# Patient Record
Sex: Female | Born: 1940 | Race: White | Hispanic: No | Smoking: Never smoker
Health system: Southern US, Community
[De-identification: ages and names within clinical notes are randomized; demographics above are authoritative.]

## PROBLEM LIST (undated history)

## (undated) DIAGNOSIS — K219 Gastro-esophageal reflux disease without esophagitis: Secondary | ICD-10-CM

## (undated) DIAGNOSIS — M1990 Unspecified osteoarthritis, unspecified site: Secondary | ICD-10-CM

## (undated) DIAGNOSIS — I38 Endocarditis, valve unspecified: Secondary | ICD-10-CM

## (undated) DIAGNOSIS — M419 Scoliosis, unspecified: Secondary | ICD-10-CM

## (undated) DIAGNOSIS — E785 Hyperlipidemia, unspecified: Secondary | ICD-10-CM

## (undated) DIAGNOSIS — R7303 Prediabetes: Secondary | ICD-10-CM

## (undated) DIAGNOSIS — I10 Essential (primary) hypertension: Secondary | ICD-10-CM

## (undated) HISTORY — DX: Unspecified osteoarthritis, unspecified site: M19.90

## (undated) HISTORY — PX: CATARACT EXTRACTION W/ INTRAOCULAR LENS  IMPLANT, BILATERAL: SHX1307

## (undated) HISTORY — PX: HERNIA REPAIR: SHX51

## (undated) HISTORY — PX: OTHER SURGICAL HISTORY: SHX169

## (undated) HISTORY — PX: TOTAL KNEE ARTHROPLASTY: SHX125

## (undated) HISTORY — PX: JOINT REPLACEMENT: SHX530

## (undated) HISTORY — PX: CHOLECYSTECTOMY: SHX55

## (undated) HISTORY — DX: Endocarditis, valve unspecified: I38

---

## 1999-08-05 ENCOUNTER — Encounter: Payer: Self-pay | Admitting: Neurosurgery

## 1999-08-05 ENCOUNTER — Ambulatory Visit (HOSPITAL_COMMUNITY): Admission: RE | Admit: 1999-08-05 | Discharge: 1999-08-05 | Payer: Self-pay | Admitting: Neurosurgery

## 1999-09-08 ENCOUNTER — Encounter: Payer: Self-pay | Admitting: Neurosurgery

## 1999-09-11 ENCOUNTER — Encounter: Payer: Self-pay | Admitting: Neurosurgery

## 1999-09-11 ENCOUNTER — Ambulatory Visit (HOSPITAL_COMMUNITY): Admission: RE | Admit: 1999-09-11 | Discharge: 1999-09-12 | Payer: Self-pay | Admitting: Neurosurgery

## 1999-12-17 ENCOUNTER — Ambulatory Visit (HOSPITAL_COMMUNITY): Admission: RE | Admit: 1999-12-17 | Discharge: 1999-12-17 | Payer: Self-pay | Admitting: Neurosurgery

## 1999-12-17 ENCOUNTER — Encounter: Payer: Self-pay | Admitting: Neurosurgery

## 2000-01-11 ENCOUNTER — Encounter: Payer: Self-pay | Admitting: Neurosurgery

## 2000-01-11 ENCOUNTER — Ambulatory Visit (HOSPITAL_COMMUNITY): Admission: RE | Admit: 2000-01-11 | Discharge: 2000-01-11 | Payer: Self-pay | Admitting: Neurosurgery

## 2000-01-25 ENCOUNTER — Ambulatory Visit (HOSPITAL_COMMUNITY): Admission: RE | Admit: 2000-01-25 | Discharge: 2000-01-25 | Payer: Self-pay | Admitting: Neurosurgery

## 2000-01-25 ENCOUNTER — Encounter: Payer: Self-pay | Admitting: Neurosurgery

## 2000-03-19 ENCOUNTER — Encounter: Payer: Self-pay | Admitting: Neurosurgery

## 2000-03-19 ENCOUNTER — Ambulatory Visit (HOSPITAL_COMMUNITY): Admission: RE | Admit: 2000-03-19 | Discharge: 2000-03-19 | Payer: Self-pay | Admitting: Neurosurgery

## 2005-05-20 ENCOUNTER — Ambulatory Visit: Payer: Self-pay

## 2005-06-22 ENCOUNTER — Ambulatory Visit: Payer: Self-pay

## 2008-08-15 ENCOUNTER — Ambulatory Visit: Payer: Self-pay

## 2009-03-19 ENCOUNTER — Ambulatory Visit: Payer: Self-pay

## 2009-04-03 ENCOUNTER — Ambulatory Visit: Payer: Self-pay | Admitting: General Surgery

## 2009-04-03 ENCOUNTER — Ambulatory Visit: Payer: Self-pay | Admitting: Cardiovascular Disease

## 2009-04-10 ENCOUNTER — Ambulatory Visit: Payer: Self-pay | Admitting: General Surgery

## 2009-08-28 ENCOUNTER — Ambulatory Visit: Payer: Self-pay

## 2010-05-04 ENCOUNTER — Ambulatory Visit: Payer: Self-pay

## 2010-06-24 ENCOUNTER — Ambulatory Visit: Payer: Self-pay | Admitting: Ophthalmology

## 2010-07-07 ENCOUNTER — Ambulatory Visit: Payer: Self-pay | Admitting: Ophthalmology

## 2010-10-20 ENCOUNTER — Ambulatory Visit: Payer: Self-pay | Admitting: Ophthalmology

## 2010-11-03 ENCOUNTER — Ambulatory Visit: Payer: Self-pay | Admitting: Ophthalmology

## 2011-03-03 ENCOUNTER — Ambulatory Visit: Payer: Self-pay

## 2011-03-08 ENCOUNTER — Ambulatory Visit: Payer: Self-pay | Admitting: General Practice

## 2011-03-08 DIAGNOSIS — I10 Essential (primary) hypertension: Secondary | ICD-10-CM

## 2011-03-24 ENCOUNTER — Inpatient Hospital Stay: Payer: Self-pay | Admitting: General Practice

## 2012-02-01 ENCOUNTER — Ambulatory Visit: Payer: Self-pay

## 2012-03-08 ENCOUNTER — Ambulatory Visit: Payer: Self-pay

## 2012-03-21 ENCOUNTER — Ambulatory Visit: Payer: Self-pay | Admitting: General Practice

## 2012-03-21 LAB — URINALYSIS, COMPLETE
Bacteria: NONE SEEN
Bilirubin,UR: NEGATIVE
Blood: NEGATIVE
Glucose,UR: NEGATIVE mg/dL (ref 0–75)
Ketone: NEGATIVE
Nitrite: NEGATIVE
Ph: 6 (ref 4.5–8.0)
Protein: NEGATIVE
RBC,UR: 1 /HPF (ref 0–5)
Specific Gravity: 1.011 (ref 1.003–1.030)
WBC UR: 49 /HPF (ref 0–5)

## 2012-03-21 LAB — CBC
HGB: 12.5 g/dL (ref 12.0–16.0)
MCH: 30.8 pg (ref 26.0–34.0)
MCHC: 33.4 g/dL (ref 32.0–36.0)
RBC: 4.05 10*6/uL (ref 3.80–5.20)
RDW: 14.2 % (ref 11.5–14.5)
WBC: 6 10*3/uL (ref 3.6–11.0)

## 2012-03-21 LAB — BASIC METABOLIC PANEL
Anion Gap: 6 — ABNORMAL LOW (ref 7–16)
BUN: 20 mg/dL — ABNORMAL HIGH (ref 7–18)
Calcium, Total: 8.6 mg/dL (ref 8.5–10.1)
Creatinine: 0.72 mg/dL (ref 0.60–1.30)
EGFR (African American): >60
Glucose: 86 mg/dL (ref 65–99)
Osmolality: 281 (ref 275–301)
Potassium: 4 mmol/L (ref 3.5–5.1)
Sodium: 140 mmol/L (ref 136–145)

## 2012-03-21 LAB — PROTIME-INR
INR: 0.9
Prothrombin Time: 12.7 secs (ref 11.5–14.7)

## 2012-03-21 LAB — MRSA PCR SCREENING

## 2012-03-21 LAB — APTT: Activated PTT: 28.9 secs (ref 23.6–35.9)

## 2012-03-23 LAB — URINE CULTURE

## 2012-04-03 ENCOUNTER — Inpatient Hospital Stay: Payer: Self-pay | Admitting: General Practice

## 2012-04-03 LAB — PROTIME-INR: Prothrombin Time: 12.8 secs (ref 11.5–14.7)

## 2012-04-04 LAB — BASIC METABOLIC PANEL
Anion Gap: 10 (ref 7–16)
BUN: 15 mg/dL (ref 7–18)
Calcium, Total: 7.9 mg/dL — ABNORMAL LOW (ref 8.5–10.1)
Creatinine: 0.72 mg/dL (ref 0.60–1.30)
EGFR (African American): >60
EGFR (Non-African Amer.): >60
Glucose: 119 mg/dL — ABNORMAL HIGH (ref 65–99)
Osmolality: 287 (ref 275–301)
Potassium: 3.9 mmol/L (ref 3.5–5.1)
Sodium: 143 mmol/L (ref 136–145)

## 2012-04-04 LAB — PLATELET COUNT: Platelet: 182 10*3/uL (ref 150–440)

## 2012-04-05 LAB — BASIC METABOLIC PANEL
BUN: 15 mg/dL (ref 7–18)
Calcium, Total: 7.6 mg/dL — ABNORMAL LOW (ref 8.5–10.1)
Chloride: 108 mmol/L — ABNORMAL HIGH (ref 98–107)
Creatinine: 0.82 mg/dL (ref 0.60–1.30)
Glucose: 109 mg/dL — ABNORMAL HIGH (ref 65–99)
Osmolality: 290 (ref 275–301)
Potassium: 3.5 mmol/L (ref 3.5–5.1)
Sodium: 145 mmol/L (ref 136–145)

## 2012-04-05 LAB — PLATELET COUNT: Platelet: 163 10*3/uL (ref 150–440)

## 2012-04-05 LAB — HEMOGLOBIN: HGB: 10.6 g/dL — ABNORMAL LOW (ref 12.0–16.0)

## 2012-06-29 ENCOUNTER — Ambulatory Visit: Payer: Self-pay | Admitting: General Surgery

## 2012-06-29 LAB — CBC WITH DIFFERENTIAL/PLATELET
Basophil #: 0 10*3/uL (ref 0.0–0.1)
Eosinophil #: 0.1 10*3/uL (ref 0.0–0.7)
Eosinophil %: 1.2 %
Lymphocyte #: 0.7 10*3/uL — ABNORMAL LOW (ref 1.0–3.6)
Lymphocyte %: 12.7 %
MCH: 29.5 pg (ref 26.0–34.0)
MCV: 87 fL (ref 80–100)
Monocyte #: 0.3 x10 3/mm (ref 0.2–0.9)
Neutrophil %: 80.8 %
Platelet: 223 10*3/uL (ref 150–440)
RBC: 4.18 10*6/uL (ref 3.80–5.20)
RDW: 14.4 % (ref 11.5–14.5)

## 2012-06-29 LAB — BASIC METABOLIC PANEL
Anion Gap: 6 — ABNORMAL LOW (ref 7–16)
Calcium, Total: 8.9 mg/dL (ref 8.5–10.1)
Co2: 33 mmol/L — ABNORMAL HIGH (ref 21–32)
Creatinine: 1 mg/dL (ref 0.60–1.30)
EGFR (Non-African Amer.): 57 — ABNORMAL LOW
Potassium: 3.3 mmol/L — ABNORMAL LOW (ref 3.5–5.1)
Sodium: 141 mmol/L (ref 136–145)

## 2012-07-03 ENCOUNTER — Ambulatory Visit: Payer: Self-pay | Admitting: General Surgery

## 2012-07-03 LAB — POTASSIUM: Potassium: 3.9 mmol/L (ref 3.5–5.1)

## 2012-09-12 ENCOUNTER — Ambulatory Visit: Payer: Self-pay

## 2013-02-14 ENCOUNTER — Ambulatory Visit: Payer: Self-pay

## 2013-05-09 ENCOUNTER — Ambulatory Visit: Payer: Self-pay

## 2014-03-15 ENCOUNTER — Ambulatory Visit: Payer: Self-pay

## 2014-09-24 ENCOUNTER — Ambulatory Visit: Payer: Self-pay | Admitting: Orthopedic Surgery

## 2014-10-28 ENCOUNTER — Ambulatory Visit: Payer: Self-pay | Admitting: Orthopedic Surgery

## 2014-11-22 ENCOUNTER — Ambulatory Visit (INDEPENDENT_AMBULATORY_CARE_PROVIDER_SITE_OTHER): Payer: Medicare HMO

## 2014-11-22 ENCOUNTER — Ambulatory Visit (INDEPENDENT_AMBULATORY_CARE_PROVIDER_SITE_OTHER): Payer: Medicare HMO | Admitting: Podiatry

## 2014-11-22 ENCOUNTER — Encounter: Payer: Self-pay | Admitting: Podiatry

## 2014-11-22 VITALS — BP 112/61 | HR 65 | Resp 16

## 2014-11-22 DIAGNOSIS — M79673 Pain in unspecified foot: Secondary | ICD-10-CM

## 2014-11-22 DIAGNOSIS — M779 Enthesopathy, unspecified: Secondary | ICD-10-CM

## 2014-11-22 NOTE — Progress Notes (Signed)
Subjective:     Patient ID: Regina Mathis, female   DOB: [birth year], 74 y.o.   MRN: [id number]  HPI patient presents after not being seen for a while with pain under her fifth metatarsals of both feet with inability to walk comfortably on her feet. States orthotics have been beneficial but they have started to wear out   Review of Systems  All other systems reviewed and are negative.      Objective:   Physical Exam  Neurovascular status intact with muscle strength adequate range of motion within normal limits. Patient's noted to have plantar flexed fifth metatarsals with keratotic lesions plantar that are very painful when pressed with obvious increased stress occurring against the bones themselves    Assessment:     Plantar flexed fifth metatarsals of both feet with lesion formation and pain    Plan:     H&P and x-rays reviewed. At this point I recommended long-term orthotics and discussed customized orthotics to reduce the stress against the fifth metatarsal and patient is scanned currently for customized orthotic devices

## 2014-11-22 NOTE — Progress Notes (Signed)
   Subjective:    Patient ID: Regina Mathis, female    DOB: 1941-08-19, 74 y.o.   MRN: [id number]  HPI Comments: Requesting orthotics     Review of Systems  Musculoskeletal: Positive for gait problem.  All other systems reviewed and are negative.      Objective:   Physical Exam        Assessment & Plan:

## 2015-01-14 NOTE — Op Note (Signed)
PATIENT NAME:  Regina Mathis, Regina Mathis MR#:  [id number] DATE OF BIRTH:  June 14, 1941  DATE OF PROCEDURE:  07/03/2012  PREOPERATIVE DIAGNOSIS: Port site incision hernia near the umbilicus.   POSTOPERATIVE DIAGNOSIS: Port site incision hernia near the umbilicus.   PROCEDURE PERFORMED: Laparoscopy and repair of incisional hernia with mesh.   SURGEON: Mckinley Jewel, M.D.   ANESTHESIA: General.   COMPLICATIONS: None.   ESTIMATED BLOOD LOSS: Minimal.   DRAINS: None.   DESCRIPTION OF PROCEDURE: This patient was put to sleep in the supine position on the operating table. The abdomen was prepped and draped out as a sterile field. A small port site incision was made in the left upper quadrant beneath the costal margin and the incision deepened through to expose the fascia. A Veress needle with the InnerDyne sleeve was then positioned in the peritoneal cavity, verified by hanging drop method. Pneumoperitoneum was obtained. At this point, the patient's previously noted hernia had reduced itself, but on inflation of the abdominal cavity a moderate swelling was encountered just to the right of the umbilicus. After pneumoperitoneum was completed, the camera was introduced with a 10 mm port in place and showed a fascial defect that was about a little larger than a fingerbreadth in size. No contents going into this hernia was noted. It was therefore decided to make a small incision overlying the hernial opening and place a circular Proceed ventral patch. A 6.7 cm diameter patch was selected. A small incision was made vertically around the right side of the umbilicus and carried down to expose the hernial sac which was then freed down to the fascia. The hernia was then opened. The sac was excised out. The fascial edges were pulled up. The mesh was then inserted and pulled up with the leaves in the middle and anchored to the fascia with three figure-of-eight stitches of 3-0 Prolene. Excess of the anterior leaves was excised  out. Pneumoperitoneum was reestablished and the camera was placed. The patch appeared to be in good position. There was some free movement of the edges noted. Accordingly, a 5 mm port was placed in the left mid abdominal area and the secure strap tacker was used to tack down the edges. One small bleeder was encountered at one of the tack sites, which was repaired with placement of a 0 Prolene stitch, with a suture passer. This was adequately controlled. Mesh was noted to be lying in good position and without any lift on the sides. The pneumoperitoneum was released. The ports were removed. The subcutaneous tissue was closed with 3-0 Vicryl and the skin with subcuticular 4-0 Vicryl covered with Dermabond. The procedure was well tolerated. She was subsequently extubated and returned to the recovery room in stable condition.  ____________________________ S.Robinette Haines, MD sgs:slb D: 07/03/2012 11:41:43 ET T: 07/03/2012 12:07:49 ET JOB#: [id number]  cc: S.G. Jamal Collin, MD, <Dictator> [facility] Robinette Haines MD ELECTRONICALLY SIGNED 07/05/2012 9:28

## 2015-01-19 NOTE — Op Note (Signed)
PATIENT NAME:  Regina Mathis, Regina Mathis MR#:  [id number] DATE OF BIRTH:  28-Dec-1940  DATE OF PROCEDURE:  04/03/2012  PREOPERATIVE DIAGNOSIS: Degenerative arthrosis of the right knee.   POSTOPERATIVE DIAGNOSIS: Degenerative arthrosis of the right knee.   PROCEDURE PERFORMED: Right total knee arthroplasty using computer-assisted navigation.   SURGEON: Laurice Record. Holley Bouche., MD   ASSISTANT: Vance Peper, PA-C (required to maintain retraction throughout the procedure)   ANESTHESIA: General.   ESTIMATED BLOOD LOSS: 50 mL.   FLUIDS REPLACED: 1200 mL of crystalloid.   TOURNIQUET TIME: 101 minutes.   DRAINS: Two medium drains to a reinfusion system.   SOFT TISSUE RELEASES: Anterior cruciate ligament, posterior cruciate ligament, deep medial collateral ligament, patellofemoral ligament, posterior lateral corner, and pie crusting of the iliotibial band.   IMPLANTS UTILIZED: DePuy PFC Sigma size 2 posterior stabilized femoral component (cemented), size 2.5 MBT tibial component (cemented), 32 mm three-peg oval dome patella (cemented), and a 12.5 mm stabilized rotating platform polyethylene insert.   INDICATIONS FOR SURGERY: The patient is a 74 year old female who has been seen for complaints of progressive right knee pain and valgus deformity. X-rays demonstrated severe degenerative changes in tricompartmental fashion with gross valgus deformity. The patient previously underwent left total knee arthroplasty with excellent results. The risks and benefits of surgical intervention were discussed with the patient. She expressed her understanding of the risks and benefits and agreed with plans for surgical intervention.  PROCEDURE IN DETAIL: The patient was brought into the Operating Room, and after adequate general anesthesia was achieved a tourniquet was placed on the patient's upper right thigh. The patient's right knee and leg were cleaned and prepped with alcohol and DuraPrep and draped in the usual sterile  fashion. A "timeout" was performed as per usual protocol. The right lower extremity was exsanguinated using an Esmarch, and the tourniquet was inflated to 300 mmHg. An anterior longitudinal incision was made, followed by a standard mid vastus approach. A large effusion was evacuated. The deep fibers of the medial collateral ligament were elevated in a subperiosteal fashion off the medial flare of the tibia so as to maintain a continuous soft tissue sleeve. The patella was subluxed laterally and the patellofemoral ligament was incised. Inspection of the knee demonstrated severe degenerative changes in tricompartmental fashion with eburnated bone noted to the lateral compartment. Prominent osteophytes were debrided using a rongeur. Anterior and posterior cruciate ligaments were excised. Two 4.0 mm Schanz pins were inserted into the femur and into the tibia for attachment of the ray of trackers used for computer-assisted navigation. Hip center was identified using circumduction technique. Distal landmarks were mapped using the computer. The distal femur and proximal tibia were mapped using the computer. The distal femoral cutting guide was positioned using computer-assisted navigation so as to achieve a 5-degree distal valgus cut. Cut was performed and verified using the computer. The distal femur was sized. and it was felt that a size 2 femoral component was appropriate. A size two cutting guide was positioned and the anterior cut was performed and verified using the computer. This was followed by completion of the posterior and chamfer cuts. A femoral cutting guide for a central box was then positioned, and the central box cut was performed.   Attention was then directed to the proximal tibia. Medial and lateral menisci were excised. The extramedullary tibial cutting guide was positioned using computer-assisted navigation so as to achieve 0-degrees varus-valgus alignment and 0-degree posterior slope. Cut was  performed and verified using the computer.  The proximal tibia was sized, and it was felt that a size 2.5 tibial tray was appropriate. Tibial and femoral trials were inserted, followed by insertion of a 10 mm polyethylene trial. The knee was felt to be tight laterally. Trial components were removed and the knee was placed in extension and distraction applied using Moreland retractors. The posterolateral corner was carefully released using a combination of electrocautery and Metzenbaum scissors. Trial components were reinserted with first a 10 and subsequently a 12.5 mm polyethylene trial. The knee was still felt to be somewhat tight laterally. Pie crusting of the iliotibial band was performed with the implants and placing the knee in full extension. This allowed for excellent mediolateral soft tissue balancing. Finally, the patella was cut and prepared so as to accommodate a 32 mm three-peg oval dome patella. A patellar trial was placed, and the knee was placed through a range of motion with excellent patellar tracking appreciated.   A femoral component was removed. The central post hole for the tibial component was reamed, followed by insertion of a keel punch. Tibial trials were then removed. The cut surfaces of bone were irrigated with copious amounts of normal saline with antibiotic solution using pulsatile lavage and then suctioned dry. Polymethylmethacrylate cement was prepared in the usual fashion using a vacuum mixer. Cement was applied to the cut surface of the proximal tibia as well as along the undersurface of a size 2.5 MBT tibial component. The tibial component was positioned and impacted into place. Excess cement was removed using freer elevators. Cement was then applied to the cut surface of the femur as well as along the posterior flanges of a size 2 posterior stabilized femoral component. The femoral component was positioned and impacted into place. Excess cement was removed using freer elevators.  A 12.5 mm polyethylene trial was inserted and the knee was brought in full extension with steady axial compression applied. Finally, cement was applied to the backside of a 32 mm three-peg oval dome patella, and the patellar component was positioned and patellar clamp applied. Excess cement was removed using freer elevators.   After adequate curing of cement, the tourniquet was deflated after a total tourniquet time of 101 minutes. Hemostasis was achieved using electrocautery. The knee was irrigated with copious amounts of normal saline with antibiotic solution using pulsatile lavage and then suctioned dry. The knee was inspected for any residual cement debris, and 30 mL of 0.25% Marcaine with epinephrine was injected along the posterior capsule. A 12.5 mm stabilized rotating platform polyethylene insert was inserted, and the knee was placed through a range of motion. Again, excellent mediolateral soft tissue balancing was appreciated both in full extension and in flexion. Excellent patellar tracking was appreciated. Two medium drains were placed in the wound bed and brought out through a separate stab incision to be attached to a reinfusion system. The medial parapatellar portion of the incision was reapproximated using interrupted sutures of #1 Vicryl. The subcutaneous tissue was approximated in layers using first #0 Vicryl, followed by 2-0 Vicryl. The skin was closed with skin staples. A sterile dressing was applied.   The patient tolerated the procedure well. She was transported to the recovery room in stable condition.  ____________________________ Laurice Record. Holley Bouche., MD jph:cbb D: 04/03/2012 19:59:04 ET T: 04/04/2012 09:59:06 ET JOB#: [id number]  cc: Jeneen Rinks P. Holley Bouche., MD, <Dictator> JAMES P Holley Bouche MD ELECTRONICALLY SIGNED 04/04/2012 23:20

## 2015-01-19 NOTE — Discharge Summary (Signed)
PATIENT NAME:  Regina Mathis, Regina Mathis MR#:  [id number] DATE OF BIRTH:  02/23/1941  DATE OF ADMISSION:  04/03/2012 DATE OF DISCHARGE:  04/06/2012   ADMITTING DIAGNOSIS: Degenerative arthrosis of the right knee.   DISCHARGE DIAGNOSIS: Degenerative arthrosis of the right knee.   HISTORY: The patient is a 74 year old female who has been followed at [facility] for progression of right knee pain. The pain was noted to be aggravated with weight-bearing activities. She was experiencing some night discomfort. She had localized most of the severe pain along the lateral aspect of the knee and did report some progressive "locked knee" deformity. At the time of surgery, she was using a walker for ambulation of a long distance. She had reported some near giving away of the knee as well. The right knee pain had progressed to the point that it was significantly interfering with her activities of daily living. X-rays taken in the [facility] showed narrowing of the lateral cartilage space with associated valgus deformity associated with valgus alignment. She was noted to have osteophyte as well as subchondral sclerosis. After a discussion of the risks and benefits of surgical intervention, the patient expressed her understanding of the risks and benefits and agreed for plans for surgical intervention.   PROCEDURE: Right total knee arthroplasty using computer-assisted navigation.   ANESTHESIA: General.   SOFT TISSUE RELEASE: Anterior cruciate ligament, posterior cruciate ligament, deep medial collateral ligament, patellofemoral ligament, posterior lateral corner, and pie crusting of the iliotibial band.   IMPLANTS UTILIZED: DePuy PFC Sigma size two posterior stabilized femoral component (cemented), size 2.5 MBT tibial component (cemented), 32 mm three pegged oval dome patella (cemented), and a 12.5 mm stabilized rotating platform polyethylene insert.   HOSPITAL COURSE: The patient tolerated the  procedure very well. She had no complications. She was then taken to PAC-U where she was stabilized and then transferred to the orthopedic floor. She began receiving anticoagulation therapy of Lovenox 30 mg subcutaneous every 12 hours per anesthesia protocol. She was fitted with TED stockings bilaterally. These were allowed to be removed one hour per eight hour shift. The right one was applied on day two following removal of the Hemovac and dressing change. She was also fitted with the AV-I compression foot pumps bilaterally set at 80 mmHg. Her calves have been nontender. There has been no evidence of any deep venous thromboses of the lower extremity. Heels were elevated off the bed using rolled towels.   The patient's vital signs have been stable. She has been afebrile. She is denying chest pain or shortness of breath. Hemodynamically, she was stable. No transfusions were given other than the Autovac transfusion given the first six hours postoperatively.   Physical therapy was initiated on day one for gait training and transfers. Upon being discharged, she was ambulating greater than 50 feet. Occupational therapy was also initiated on day one for activities of daily living and assistive devices.   The patient's IV, Foley and Hemovac were discontinued on day two along with dressing change. The Polar Care was reapplied to the surgical leg maintaining a temperature of 40 to 50 degrees Fahrenheit. The wound has been free of any drainage or any signs of infection.   DISPOSITION: The patient is being discharged to skilled nursing facility in improved stable condition.   DISCHARGE INSTRUCTIONS:  1. She may weight bear as tolerated.  2. Continue using the immobilizer until she is able to do 10 straight leg raises on her own.  3. Continue TED  stockings bilaterally. These are allowed to be removed one hour per eight hour shift.  4. Incentive spirometer q.1 hour while awake.  5. Encourage cough, deep breathing  every two hours while awake.  6. Polar Care to the surgical leg maintaining a temperature of 40 to 50 degrees Fahrenheit.  7. Occupational therapy for activities of daily living and assistive devices.  8. Physical therapy for gait training and transfers.  9. She is placed on a regular diet.  10. She has a follow-up in the clinic on 07/23 at 9:45 with Vance Peper, PA.  11. Follow-up appointment with Dr. Marry Guan on 08/20 at 10:45.   DRUG ALLERGIES: No known drug allergies.   MEDICATIONS:  1. Dulcolax suppository 10 mg rectally p.r.n. for constipation. 2. Milk of magnesia 30 mL b.i.d. p.r.n.  3. Mylanta DS 30 mL every six hours p.r.n.  4. Senokot-S 1 tablet b.i.d.  5. Fish oil 1 gram capsule daily. 6. Lasix 20 mg daily. 7. Lisinopril 20/HCTZ 25 mg 1 capsule q.a.m.  8. Zantac 150 mg daily. 9. Zocor 40 mg at bedtime.  10. Ascorbic acid 1000 milligrams daily.  11. Vitamin D3 1000 units daily. 12. Tylenol ES 500 to 1000 mg milligrams every four hours p.r.n. for temperatures of 100.4. 13. Roxicodone 5 to 10 mg every 4 to 6 hours p.r.n. for pain. 14. Tramadol 50 to 100 mg every four to six hours p.r.n. for pain.  15. Lovenox 30 mg subcutaneous every 12 hours for 14 days, then discontinue and begin taking one 81 mg enteric-coated aspirin.   PAST MEDICAL HISTORY:  1. Arthritis.  2. Hypertension. 3. Cardiomegaly. 4. Cataracts.   ____________________________ Vance Peper, PA jrw:ap D: 04/06/2012 06:58:01 ET T: 04/06/2012 07:45:24 ET JOB#: [id number]  cc: Vance Peper, PA, <Dictator> JON WOLFE PA ELECTRONICALLY SIGNED 04/06/2012 21:49

## 2015-11-20 DIAGNOSIS — M159 Polyosteoarthritis, unspecified: Secondary | ICD-10-CM | POA: Insufficient documentation

## 2015-11-20 DIAGNOSIS — M150 Primary generalized (osteo)arthritis: Secondary | ICD-10-CM

## 2016-01-15 ENCOUNTER — Encounter: Payer: Self-pay | Admitting: *Deleted

## 2016-01-16 ENCOUNTER — Ambulatory Visit
Admission: RE | Admit: 2016-01-16 | Discharge: 2016-01-16 | Disposition: A | Discharge status: Home / Self Care | Payer: Commercial Managed Care - HMO | Source: Ambulatory Visit | Attending: Gastroenterology | Admitting: Gastroenterology

## 2016-01-16 ENCOUNTER — Ambulatory Visit: Payer: Commercial Managed Care - HMO | Admitting: Anesthesiology

## 2016-01-16 ENCOUNTER — Encounter: Payer: Self-pay | Admitting: *Deleted

## 2016-01-16 ENCOUNTER — Encounter
Admission: RE | Disposition: A | Discharge status: Home / Self Care | Payer: Self-pay | Source: Ambulatory Visit | Attending: Gastroenterology

## 2016-01-16 DIAGNOSIS — R195 Other fecal abnormalities: Secondary | ICD-10-CM | POA: Insufficient documentation

## 2016-01-16 DIAGNOSIS — Z833 Family history of diabetes mellitus: Secondary | ICD-10-CM | POA: Insufficient documentation

## 2016-01-16 DIAGNOSIS — Z9049 Acquired absence of other specified parts of digestive tract: Secondary | ICD-10-CM | POA: Insufficient documentation

## 2016-01-16 DIAGNOSIS — Z79899 Other long term (current) drug therapy: Secondary | ICD-10-CM | POA: Insufficient documentation

## 2016-01-16 DIAGNOSIS — Z801 Family history of malignant neoplasm of trachea, bronchus and lung: Secondary | ICD-10-CM | POA: Insufficient documentation

## 2016-01-16 DIAGNOSIS — D122 Benign neoplasm of ascending colon: Secondary | ICD-10-CM | POA: Insufficient documentation

## 2016-01-16 DIAGNOSIS — E785 Hyperlipidemia, unspecified: Secondary | ICD-10-CM | POA: Insufficient documentation

## 2016-01-16 DIAGNOSIS — D123 Benign neoplasm of transverse colon: Secondary | ICD-10-CM | POA: Insufficient documentation

## 2016-01-16 DIAGNOSIS — D175 Benign lipomatous neoplasm of intra-abdominal organs: Secondary | ICD-10-CM | POA: Insufficient documentation

## 2016-01-16 DIAGNOSIS — M1990 Unspecified osteoarthritis, unspecified site: Secondary | ICD-10-CM | POA: Insufficient documentation

## 2016-01-16 DIAGNOSIS — I10 Essential (primary) hypertension: Secondary | ICD-10-CM | POA: Insufficient documentation

## 2016-01-16 DIAGNOSIS — Z96653 Presence of artificial knee joint, bilateral: Secondary | ICD-10-CM | POA: Insufficient documentation

## 2016-01-16 DIAGNOSIS — K5730 Diverticulosis of large intestine without perforation or abscess without bleeding: Secondary | ICD-10-CM | POA: Insufficient documentation

## 2016-01-16 DIAGNOSIS — K219 Gastro-esophageal reflux disease without esophagitis: Secondary | ICD-10-CM | POA: Insufficient documentation

## 2016-01-16 DIAGNOSIS — Z9842 Cataract extraction status, left eye: Secondary | ICD-10-CM | POA: Insufficient documentation

## 2016-01-16 DIAGNOSIS — K635 Polyp of colon: Secondary | ICD-10-CM | POA: Insufficient documentation

## 2016-01-16 DIAGNOSIS — K640 First degree hemorrhoids: Secondary | ICD-10-CM | POA: Insufficient documentation

## 2016-01-16 DIAGNOSIS — Z7982 Long term (current) use of aspirin: Secondary | ICD-10-CM | POA: Insufficient documentation

## 2016-01-16 DIAGNOSIS — I517 Cardiomegaly: Secondary | ICD-10-CM | POA: Insufficient documentation

## 2016-01-16 DIAGNOSIS — Z9841 Cataract extraction status, right eye: Secondary | ICD-10-CM | POA: Insufficient documentation

## 2016-01-16 DIAGNOSIS — Z823 Family history of stroke: Secondary | ICD-10-CM | POA: Insufficient documentation

## 2016-01-16 DIAGNOSIS — Z8249 Family history of ischemic heart disease and other diseases of the circulatory system: Secondary | ICD-10-CM | POA: Insufficient documentation

## 2016-01-16 HISTORY — DX: Essential (primary) hypertension: I10

## 2016-01-16 HISTORY — DX: Hyperlipidemia, unspecified: E78.5

## 2016-01-16 HISTORY — DX: Gastro-esophageal reflux disease without esophagitis: K21.9

## 2016-01-16 HISTORY — PX: COLONOSCOPY WITH PROPOFOL: SHX5780

## 2016-01-16 SURGERY — COLONOSCOPY WITH PROPOFOL
Anesthesia: General

## 2016-01-16 MED ORDER — FENTANYL CITRATE (PF) 100 MCG/2ML IJ SOLN
25.0000 ug | INTRAMUSCULAR | Status: DC | PRN
  Administered 2016-01-16: 50 ug via INTRAVENOUS

## 2016-01-16 MED ORDER — SODIUM CHLORIDE 0.9 % IV SOLN
INTRAVENOUS | Status: DC
  Administered 2016-01-16: 11:00:00 via INTRAVENOUS
  Administered 2016-01-16: 1000 mL via INTRAVENOUS

## 2016-01-16 MED ORDER — PROPOFOL 500 MG/50ML IV EMUL
INTRAVENOUS | Status: DC | PRN
  Administered 2016-01-16: 75 ug/kg/min via INTRAVENOUS

## 2016-01-16 MED ORDER — MIDAZOLAM HCL 2 MG/2ML IJ SOLN
INTRAMUSCULAR | Status: DC | PRN
  Administered 2016-01-16: 1 mg via INTRAVENOUS

## 2016-01-16 MED ORDER — ONDANSETRON HCL 4 MG/2ML IJ SOLN: 4.0000 mg | Freq: Once | INTRAMUSCULAR | Status: DC | PRN

## 2016-01-16 MED ORDER — PROPOFOL 10 MG/ML IV BOLUS
INTRAVENOUS | Status: DC | PRN
  Administered 2016-01-16: 30 mg via INTRAVENOUS

## 2016-01-16 NOTE — Anesthesia Postprocedure Evaluation (Signed)
Anesthesia Post Note  Patient: Regina Mathis  Procedure(s) Performed: Procedure(s) (LRB): COLONOSCOPY WITH PROPOFOL (N/A)  Patient location during evaluation: PACU Anesthesia Type: General Level of consciousness: awake and alert and oriented Pain management: pain level controlled Vital Signs Assessment: post-procedure vital signs reviewed and stable Respiratory status: spontaneous breathing Cardiovascular status: blood pressure returned to baseline Anesthetic complications: no    Last Vitals:  Filed Vitals:   01/16/16 1130 01/16/16 1140  BP: 119/66 119/66  Pulse: 83 45  Temp:    Resp: 23 21    Last Pain: There were no vitals filed for this visit.               Ahmere Hemenway

## 2016-01-16 NOTE — Transfer of Care (Signed)
Immediate Anesthesia Transfer of Care Note  Patient: Regina Mathis  Procedure(s) Performed: Procedure(s): COLONOSCOPY WITH PROPOFOL (N/A)  Patient Location: PACU  Anesthesia Type:General  Level of Consciousness: awake and sedated  Airway & Oxygen Therapy: Patient Spontanous Breathing and Patient connected to nasal cannula oxygen  Post-op Assessment: Report given to RN and Post -op Vital signs reviewed and stable  Post vital signs: Reviewed and stable  Last Vitals:  Filed Vitals:   01/16/16 1013  BP: 112/78  Pulse: 79  Temp: 36.7 C  Resp: 18    Complications: No apparent anesthesia complications

## 2016-01-16 NOTE — Anesthesia Preprocedure Evaluation (Signed)
Anesthesia Evaluation  Patient identified by MRN, date of birth, ID band Patient awake    Reviewed: Allergy & Precautions, NPO status , Patient's Chart, lab work & pertinent test results  Airway Mallampati: II       Dental  (+) Chipped   Pulmonary neg pulmonary ROS,    Pulmonary exam normal        Cardiovascular hypertension, Normal cardiovascular exam  Enlarged heart   Neuro/Psych negative neurological ROS  negative psych ROS   GI/Hepatic Neg liver ROS, GERD  Medicated and Controlled,  Endo/Other  negative endocrine ROS  Renal/GU negative Renal ROS  negative genitourinary   Musculoskeletal  (+) Arthritis , Osteoarthritis,    Abdominal Normal abdominal exam  (+)   Peds negative pediatric ROS (+)  Hematology negative hematology ROS (+)   Anesthesia Other Findings   Reproductive/Obstetrics                             Anesthesia Physical Anesthesia Plan  ASA: III  Anesthesia Plan: General   Post-op Pain Management:    Induction: Intravenous  Airway Management Planned: Nasal Cannula  Additional Equipment:   Intra-op Plan:   Post-operative Plan:   Informed Consent: I have reviewed the patients History and Physical, chart, labs and discussed the procedure including the risks, benefits and alternatives for the proposed anesthesia with the patient or authorized representative who has indicated his/her understanding and acceptance.   Dental advisory given  Plan Discussed with: CRNA and Surgeon  Anesthesia Plan Comments:         Anesthesia Quick Evaluation

## 2016-01-16 NOTE — H&P (Signed)
Primary Care Physician:  Glendon Axe, MD  Pre-Procedure History & Physical: HPI:  Regina Mathis is a 75 y.o. female is here for an colonoscopy.   Past Medical History  Diagnosis Date  . Arthritis   . Cataracts, bilateral   . GERD (gastroesophageal reflux disease)   . Enlarged heart   . Hernia of abdominal cavity   . Hyperlipidemia   . Hypertension     Past Surgical History  Procedure Laterality Date  . Cholecystectomy    . Lumpectomy chest area    . Cataract extraction w/ intraocular lens  implant, bilateral    . Total knee arthroplasty Bilateral   . Joint replacement Bilateral     Total Knee Arthroplasty (LT 2012) (RT 2013)  . Hernia repair      with Bowel Involvement (04/2012)    Prior to Admission medications   Medication Sig Start Date End Date Taking? Authorizing Provider  ascorbic acid (VITAMIN C) 1000 MG tablet Take 1,000 mg by mouth daily.   Yes Historical Provider, MD  aspirin EC 81 MG tablet Take 81 mg by mouth daily.   Yes Historical Provider, MD  Biotin 1 MG CAPS Take 1 mg by mouth daily.   Yes Historical Provider, MD  CHOLECALCIFEROL PO Take 1,000 Units by mouth daily.   Yes Historical Provider, MD  lisinopril-hydrochlorothiazide (PRINZIDE,ZESTORETIC) 20-25 MG per tablet Take 1 tablet by mouth daily.   Yes Historical Provider, MD  Omega 3-6-9 Fatty Acids (OMEGA 3-6-9 COMPLEX PO) Take 1,200 mg by mouth daily.   Yes Historical Provider, MD  furosemide (LASIX) 20 MG tablet Take 20 mg by mouth.    Historical Provider, MD  ranitidine (ZANTAC) 150 MG tablet Take 150 mg by mouth 2 (two) times daily.    Historical Provider, MD  simvastatin (ZOCOR) 40 MG tablet Take 40 mg by mouth daily.    Historical Provider, MD  traMADol (ULTRAM) 50 MG tablet Take by mouth every 6 (six) hours as needed.    Historical Provider, MD    Allergies as of 01/09/2016  . (No Known Allergies)    Family History  Problem Relation Age of Onset  . CAD Mother   . Diabetes Mellitus II  Mother   . Stroke Mother   . Lung cancer Father   . Diabetes Mellitus II Sister   . Diabetes Mellitus II Brother     Social History   Social History  . Marital Status: Widowed    Spouse Name: N/A  . Number of Children: N/A  . Years of Education: N/A   Occupational History  . Not on file.   Social History Main Topics  . Smoking status: Never Smoker   . Smokeless tobacco: Never Used  . Alcohol Use: No  . Drug Use: No  . Sexual Activity: Not on file   Other Topics Concern  . Not on file   Social History Narrative     Physical Exam: BP 112/78 mmHg  Pulse 79  Temp(Src) 98 F (36.7 C) (Oral)  Resp 18  Ht 5\' 2"  (1.575 m)  Wt 81.647 kg (180 lb)  BMI 32.91 kg/m2  SpO2 100% General:   Alert,  pleasant and cooperative in NAD Head:  Normocephalic and atraumatic. Neck:  Supple; no masses or thyromegaly. Lungs:  Clear throughout to auscultation.    Heart:  Regular rate and rhythm. Abdomen:  Soft, nontender and nondistended. Normal bowel sounds, without guarding, and without rebound.   Neurologic:  Alert and  oriented x4;  grossly normal neurologically.  Impression/Plan: Regina Mathis is here for an colonoscopy to be performed for + FOBT  Risks, benefits, limitations, and alternatives regarding  colonoscopy have been reviewed with the patient.  Questions have been answered.  All parties agreeable.   Regina Class, MD  01/16/2016, 10:43 AM

## 2016-01-16 NOTE — Discharge Instructions (Signed)

## 2016-01-16 NOTE — Op Note (Signed)
[facility] Gastroenterology Patient Name: Regina Mathis Procedure Date: 01/16/2016 10:24 AM MRN: [id number] Account #: [id number] Date of Birth: 1941-02-28 Admit Type: Outpatient Age: 75 Room: [facility] ENDO ROOM 1 Gender: Female Note Status: Finalized Procedure:            Colonoscopy Indications:          This is the patient's first colonoscopy, Heme positive                        stool Patient Profile:      This is a 75 year old female. Providers:            Gerrit Heck. Rayann Heman, MD Referring MD:         Glendon Axe (Referring MD) Medicines:            Propofol per Anesthesia Complications:        No immediate complications. Procedure:            Pre-Anesthesia Assessment:                       - Prior to the procedure, a History and Physical was                        performed, and patient medications, allergies and                        sensitivities were reviewed. The patient's tolerance of                        previous anesthesia was reviewed.                       After obtaining informed consent, the colonoscope was                        passed under direct vision. Throughout the procedure,                        the patient's blood pressure, pulse, and oxygen                        saturations were monitored continuously. The                        Colonoscope was introduced through the anus and                        advanced to the the cecum, identified by appendiceal                        orifice and ileocecal valve. The colonoscopy was                        performed without difficulty. The patient tolerated the                        procedure well. The quality of the bowel preparation                        was good. Findings:      The perianal and digital rectal  examinations were normal.      Five sessile polyps were found in the transverse colon, ascending colon       and ileocecal valve. The polyps were 2 to 5 mm in size. These polyps   were removed with a cold snare and cold biopsy polypectomy as       appropriate. Resection and retrieval were complete.      There was a medium-sized lipoma, 10 mm in diameter, in the ascending       colon.      Many small-mouthed diverticula were found in the sigmoid colon.      Internal hemorrhoids were found during retroflexion. The hemorrhoids       were Grade I (internal hemorrhoids that do not prolapse).      The exam was otherwise without abnormality. Impression:           - Five 2 to 5 mm polyps in the transverse colon, in the                        ascending colon and at the ileocecal valve, removed                        with a cold snare and cold biopsy polypectomy as                        appropriate Resected and retrieved.                       - Medium-sized lipoma in the ascending colon.                       - Diverticulosis in the sigmoid colon.                       - Internal hemorrhoids.                       - The examination was otherwise normal. Recommendation:       - Observe patient in GI recovery unit.                       - Resume regular diet.                       - Continue present medications.                       - Await pathology results.                       - Repeat colonoscopy for surveillance based on                        pathology results.                       - Return to referring physician.                       - The findings and recommendations were discussed with                        the patient.                       -  The findings and recommendations were discussed with                        the patient's family. Procedure Code(s):    --- Professional ---                       [phone], Colonoscopy, flexible; with removal of tumor(s),                        polyp(s), or other lesion(s) by snare technique Diagnosis Code(s):    --- Professional ---                       D12.3, Benign neoplasm of transverse colon (hepatic                         flexure or splenic flexure)                       D12.2, Benign neoplasm of ascending colon                       D12.0, Benign neoplasm of cecum                       K64.0, First degree hemorrhoids                       R19.5, Other fecal abnormalities                       K57.30, Diverticulosis of large intestine without                        perforation or abscess without bleeding CPT copyright 2016 American Medical Association. All rights reserved. The codes documented in this report are preliminary and upon coder review may  be revised to meet current compliance requirements. Mellody Life, MD 01/16/2016 11:15:34 AM This report has been signed electronically. Number of Addenda: 0 Note Initiated On: 01/16/2016 10:24 AM Scope Withdrawal Time: 0 hours 13 minutes 43 seconds  Total Procedure Duration: 0 hours 22 minutes 13 seconds       [facility]

## 2016-01-19 LAB — SURGICAL PATHOLOGY

## 2016-01-21 ENCOUNTER — Encounter: Payer: Self-pay | Admitting: Gastroenterology

## 2016-08-16 ENCOUNTER — Other Ambulatory Visit: Payer: Self-pay | Admitting: Internal Medicine

## 2016-08-16 DIAGNOSIS — Z1231 Encounter for screening mammogram for malignant neoplasm of breast: Secondary | ICD-10-CM

## 2016-08-16 DIAGNOSIS — R3129 Other microscopic hematuria: Secondary | ICD-10-CM

## 2016-08-27 ENCOUNTER — Ambulatory Visit
Admission: RE | Admit: 2016-08-27 | Discharge: 2016-08-27 | Disposition: A | Discharge status: Home / Self Care | Payer: Commercial Managed Care - HMO | Source: Ambulatory Visit | Attending: Internal Medicine | Admitting: Internal Medicine

## 2016-08-27 DIAGNOSIS — I2510 Atherosclerotic heart disease of native coronary artery without angina pectoris: Secondary | ICD-10-CM | POA: Diagnosis not present

## 2016-08-27 DIAGNOSIS — R3129 Other microscopic hematuria: Secondary | ICD-10-CM

## 2016-08-27 DIAGNOSIS — I700 Atherosclerosis of aorta: Secondary | ICD-10-CM | POA: Diagnosis not present

## 2016-08-27 DIAGNOSIS — M47816 Spondylosis without myelopathy or radiculopathy, lumbar region: Secondary | ICD-10-CM | POA: Insufficient documentation

## 2016-08-27 DIAGNOSIS — R911 Solitary pulmonary nodule: Secondary | ICD-10-CM | POA: Insufficient documentation

## 2016-08-27 DIAGNOSIS — R938 Abnormal findings on diagnostic imaging of other specified body structures: Secondary | ICD-10-CM | POA: Insufficient documentation

## 2016-08-27 DIAGNOSIS — R319 Hematuria, unspecified: Secondary | ICD-10-CM | POA: Insufficient documentation

## 2016-08-27 DIAGNOSIS — Z9049 Acquired absence of other specified parts of digestive tract: Secondary | ICD-10-CM | POA: Insufficient documentation

## 2016-08-27 DIAGNOSIS — K5730 Diverticulosis of large intestine without perforation or abscess without bleeding: Secondary | ICD-10-CM | POA: Diagnosis not present

## 2016-08-27 DIAGNOSIS — Q790 Congenital diaphragmatic hernia: Secondary | ICD-10-CM | POA: Diagnosis not present

## 2016-08-27 DIAGNOSIS — M5136 Other intervertebral disc degeneration, lumbar region: Secondary | ICD-10-CM | POA: Diagnosis not present

## 2016-08-27 DIAGNOSIS — M4186 Other forms of scoliosis, lumbar region: Secondary | ICD-10-CM | POA: Diagnosis not present

## 2016-08-27 DIAGNOSIS — N200 Calculus of kidney: Secondary | ICD-10-CM | POA: Insufficient documentation

## 2016-08-27 MED ORDER — IOPAMIDOL (ISOVUE-300) INJECTION 61%
100.0000 mL | Freq: Once | INTRAVENOUS | Status: AC | PRN
  Administered 2016-08-27: 100 mL via INTRAVENOUS

## 2016-08-30 ENCOUNTER — Other Ambulatory Visit: Payer: Self-pay | Admitting: Internal Medicine

## 2016-08-30 DIAGNOSIS — R9389 Abnormal findings on diagnostic imaging of other specified body structures: Secondary | ICD-10-CM

## 2016-09-09 ENCOUNTER — Ambulatory Visit
Admission: RE | Admit: 2016-09-09 | Discharge: 2016-09-09 | Disposition: A | Discharge status: Home / Self Care | Payer: Commercial Managed Care - HMO | Source: Ambulatory Visit | Attending: Internal Medicine | Admitting: Internal Medicine

## 2016-09-09 DIAGNOSIS — R9389 Abnormal findings on diagnostic imaging of other specified body structures: Secondary | ICD-10-CM

## 2016-09-09 DIAGNOSIS — R938 Abnormal findings on diagnostic imaging of other specified body structures: Secondary | ICD-10-CM | POA: Insufficient documentation

## 2016-09-22 ENCOUNTER — Inpatient Hospital Stay: Payer: Commercial Managed Care - HMO | Attending: Obstetrics and Gynecology | Admitting: Obstetrics and Gynecology

## 2016-09-22 VITALS — BP 144/72 | HR 57 | Temp 98.0°F | Ht 61.0 in | Wt 179.0 lb

## 2016-09-22 DIAGNOSIS — M4186 Other forms of scoliosis, lumbar region: Secondary | ICD-10-CM | POA: Insufficient documentation

## 2016-09-22 DIAGNOSIS — I5032 Chronic diastolic (congestive) heart failure: Secondary | ICD-10-CM | POA: Diagnosis not present

## 2016-09-22 DIAGNOSIS — M479 Spondylosis, unspecified: Secondary | ICD-10-CM | POA: Diagnosis not present

## 2016-09-22 DIAGNOSIS — Z7982 Long term (current) use of aspirin: Secondary | ICD-10-CM | POA: Insufficient documentation

## 2016-09-22 DIAGNOSIS — I110 Hypertensive heart disease with heart failure: Secondary | ICD-10-CM | POA: Diagnosis not present

## 2016-09-22 DIAGNOSIS — M1990 Unspecified osteoarthritis, unspecified site: Secondary | ICD-10-CM | POA: Insufficient documentation

## 2016-09-22 DIAGNOSIS — N200 Calculus of kidney: Secondary | ICD-10-CM | POA: Insufficient documentation

## 2016-09-22 DIAGNOSIS — K219 Gastro-esophageal reflux disease without esophagitis: Secondary | ICD-10-CM | POA: Insufficient documentation

## 2016-09-22 DIAGNOSIS — R938 Abnormal findings on diagnostic imaging of other specified body structures: Secondary | ICD-10-CM | POA: Diagnosis not present

## 2016-09-22 DIAGNOSIS — K5730 Diverticulosis of large intestine without perforation or abscess without bleeding: Secondary | ICD-10-CM | POA: Insufficient documentation

## 2016-09-22 DIAGNOSIS — Z79899 Other long term (current) drug therapy: Secondary | ICD-10-CM | POA: Diagnosis not present

## 2016-09-22 DIAGNOSIS — E785 Hyperlipidemia, unspecified: Secondary | ICD-10-CM | POA: Diagnosis not present

## 2016-09-22 DIAGNOSIS — Z9049 Acquired absence of other specified parts of digestive tract: Secondary | ICD-10-CM | POA: Diagnosis not present

## 2016-09-22 DIAGNOSIS — I38 Endocarditis, valve unspecified: Secondary | ICD-10-CM | POA: Insufficient documentation

## 2016-09-22 DIAGNOSIS — M5136 Other intervertebral disc degeneration, lumbar region: Secondary | ICD-10-CM | POA: Diagnosis not present

## 2016-09-22 DIAGNOSIS — I2510 Atherosclerotic heart disease of native coronary artery without angina pectoris: Secondary | ICD-10-CM | POA: Insufficient documentation

## 2016-09-22 DIAGNOSIS — R9389 Abnormal findings on diagnostic imaging of other specified body structures: Secondary | ICD-10-CM

## 2016-09-22 NOTE — Progress Notes (Signed)
  Oncology Nurse Navigator Documentation Chaperoned pelvic exam. Endometrial biopsy sent to pathology. Navigator Location: [facility] (09/22/16 1500)   )Navigator Encounter Type: Initial GynOnc (09/22/16 1500)                     Patient Visit Type: GynOnc;Initial (09/22/16 1500) Treatment Phase: Abnormal Scans (09/22/16 1500)                  Acuity: Level 2 (09/22/16 1500)   Acuity Level 2: Initial guidance, education and coordination as needed;Educational needs;Ongoing guidance and education throughout treatment as needed (09/22/16 1500)     Time Spent with Patient: 30 (09/22/16 1500)

## 2016-09-22 NOTE — Patient Instructions (Signed)

## 2016-09-22 NOTE — Progress Notes (Signed)
Patient here as a referral, she has complaints of lower right abdominal pain. No spotting. She did have blood in her urine sample.

## 2016-09-22 NOTE — Progress Notes (Signed)
Gynecologic Oncology Consult Visit   Referring Provider: Glendon Axe, MD [facility] [place], [place] [address] [phone]   Chief Concern: thickened endometrial stripe   Subjective:  Regina Mathis is a 75 y.o. female who is seen in consultation from Dr. Candiss Norse for thickened endometrial stripe .  She had a CT scan on 08/27/2016 to evaluate chronic bladder infections and microscopic Hematuria. She has no complaints of vaginal bleeding.   CT scan abdomen/pelvis IMPRESSION: 1. Nonobstructive right renal staghorn calculi. No other specific cause for hematuria identified. 2. 3 by 5 mm right middle lobe pulmonary nodule. No follow-up needed if patient is low-risk. Non-contrast chest CT can be considered in 12 months if patient is high-risk. This recommendation follows the consensus statement: Guidelines for Management of Incidental Pulmonary Nodules Detected on CT Images: From the Fleischner Society 2017; Radiology 2017; 284:228-243. 3. **An incidental finding of potential clinical significance has been found. Abnormal thickened endometrium, 1.8 cm in thickness, heterogeneous. Pelvic sonography recommended for definitive characterization and to exclude endometrial mass/malignancy.** 4. Prominent lumbar scoliosis, spondylosis, and degenerative disc disease causing multilevel impingement. 5. Subcutaneous, omental, and likely mild mesenteric edema suggesting third spacing of fluid. 6. Morgagni hernia containing adipose tissue. 7. 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 8. Minimal intrahepatic biliary dilatation with CBD up to 1.2 cm, much of this may be a physiologic response to cholecystectomy. 9. Sigmoid colon diverticulosis. These results will be called to the ordering clinician or representative by the Radiologist Assistant, and communication documented in the PACS or zVision Dashboard.  Pelvic Ultrasound  transvaginal/transabdominal COMPARISON:  CT Abdomen and Pelvis 08/27/2016. FINDINGS: Uterus: Measurements: 5.2 x 2.8 x 4.1 cm. No fibroids or other mass visualized. Endometrium: Thickness: 15 mm (image 59). Severely heterogeneous (image 41) with some vascular internal architecture ( image 54). Right ovary: Measurements: No definite right ovarian parenchyma identified. However, on transabdominal images there is a 3.3 cm cystic, loculated appearing area near the iliac vessels (images 26 through 31). Although there appear to be internal septations there is no internal vascularity evident on Doppler. Left ovary: Measurements: Not visualized. Other findings No abnormal free fluid.  IMPRESSION: 1. Confirmed Abnormally thickened (15 mm) and heterogeneous endometrium, suspicious for carcinoma. GYN surgery consultation and Endometrial Sampling recommended. 2. Left ovary not visualized. 3.3 cm area of mildly loculated but otherwise simple appearing cystic change in the region of the right ovary. 3. No pelvic free fluid.  Normal appearing uterine myometrium. These results will be called to the ordering clinician or representative by the Radiologist Assistant, and communication documented in the PACS or zVision Dashboard.  J[employer]ovah's Witness - refuses blood transfusions.     Problem List: Patient Active Problem List   Diagnosis Date Noted  . Abnormal finding on radiology exam 09/22/2016    Past Medical History: Past Medical History:  Diagnosis Date  . Arthritis   . Cataracts, bilateral   . Chronic diastolic congestive heart failure ([place])   . Enlarged heart   . GERD (gastroesophageal reflux disease)   . Hernia of abdominal cavity   . Hyperlipidemia   . Hypertension   . Osteoarthritis   . Valvular heart disease    Echocardiogram 2014- moderate mitral regurg, moderate tricuspid regurg, moderate dilated left atrium and right atrium, EF 50-55%    Past Surgical History: Past  Surgical History:  Procedure Laterality Date  . CATARACT EXTRACTION W/ INTRAOCULAR LENS  IMPLANT, BILATERAL    . CHOLECYSTECTOMY    . COLONOSCOPY WITH  PROPOFOL N/A 01/16/2016   Procedure: COLONOSCOPY WITH PROPOFOL;  Surgeon: [name], MD;  Location: [facility] ENDOSCOPY;  Service: Endoscopy;  Laterality: N/A;  . HERNIA REPAIR     with Bowel Involvement (04/2012)  . 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 Lumpectomy Chest Area    . TOTAL KNEE ARTHROPLASTY Bilateral     Past Gynecologic History:  See interval history.  No abnormal Pap smears. Last Pap 2015 normal   OB History: P0  Family History: Family History  Problem Relation Age of Onset  . CAD Mother   . Diabetes Mellitus II Mother   . Stroke Mother   . Lung cancer Father   . Diabetes Mellitus II Sister   . Diabetes Mellitus II Brother     Social History: Social History   Social History  . Marital status: Widowed    Spouse name: N/A  . Number of children: N/A  . Years of education: N/A   Occupational History  . retired    Social History Main Topics  . Smoking status: Never Smoker  . Smokeless tobacco: Never Used  . Alcohol use No  . Drug use: No  . Sexual activity: Not on file   Other Topics Concern  . Not on file   Social History Narrative  . No narrative on file    Allergies: No Known Allergies  Current Medications: Current Outpatient Prescriptions  Medication Sig Dispense Refill  . ascorbic acid (VITAMIN C) 1000 MG tablet Take 1,000 mg by mouth daily.    Marland Kitchen aspirin EC 81 MG tablet Take 81 mg by mouth daily.    . Biotin 1 MG CAPS Take 1 mg by mouth daily.    . CHOLECALCIFEROL PO Take 1,000 Units by mouth daily.    . furosemide (LASIX) 20 MG tablet Take 20 mg by mouth.    Marland Kitchen lisinopril-hydrochlorothiazide (PRINZIDE,ZESTORETIC) 20-25 MG per tablet Take 1 tablet by mouth daily.    . Omega 3-6-9 Fatty Acids (OMEGA 3-6-9 COMPLEX PO) Take 1,200 mg by mouth  daily.    . ranitidine (ZANTAC) 150 MG tablet Take 150 mg by mouth 2 (two) times daily.    . simvastatin (ZOCOR) 40 MG tablet Take 40 mg by mouth daily.    . traMADol (ULTRAM) 50 MG tablet Take by mouth every 6 (six) hours as needed.     No current facility-administered medications for this visit.     Review of Systems General: negative for, fevers, fatigue, changes in weight or appetite Eyes: positive for visual issues s/p corneal transplant HEENT: hearing loss Pulmonary: negative for, dyspnea, productive cough Cardiac: negative for, palpitations, pain, discomfort Gastrointestinal: negative for, nausea, vomiting, constipation, diarrhea, positive for rectal bleeding after bowel movements Genitourinary/Sexual: incontinence, frequency, microscopic hematuria Ob/Gyn: negative for, irregular bleeding, pain Musculoskeletal: pain, stiffness legs, joints, and back Hematology: easy bruising Neurologic/Psych: weakness  Objective:  Physical Examination:  Ht 5\' 1"  (1.549 m)   Wt 179 lb 0.2 oz (81.2 kg)   BMI 33.82 kg/m    ECOG Performance Status: 1 - Symptomatic but completely ambulatory  General appearance: alert, cooperative and appears stated age, walking with walker due to back issues HEENT:PERRLA, extra ocular movement intact and sclera clear, anicteric Lymph node survey: non-palpable, axillary, inguinal, supraclavicular Cardiovascular: regular rate and rhythm Respiratory: normal air entry, lungs clear to auscultation Breast exam: not examined. Abdomen: soft, non-tender, without masses or organomegaly, no hernias or ascites Back: inspection of back is  normal except vertical incision Extremity: normal Neurological exam reveals alert, oriented, normal speech, no focal findings noted.  Pelvic: exam chaperoned by nurse;  Vulva: normal appearing vulva with no masses, tenderness or lesions; Vagina: normal vagina; Adnexa: not palpated, nontender and no masses; Uterus: uterus is normal  size, shape, consistency and nontender; Cervix: no lesions; Rectal: normal rectal, no masses  Endometrial Biopsy The risks and benefits of the procedure were reviewed and informed consent obtained. Time out was performed. The patient received pre-procedure teaching and expressed understanding. The post-procedure instructions were reviewed with the patient and she expressed understanding. The patient does not have any barriers to learning.  Endometrial biopsy was performed after bimanual exam. The cervix was cleansed with Betadine. The anterior cervical lip was grasped with a single tooth tenaculum. The cervix was slightly stenotic and opened with the os finding. The pipelle was introduced and the uterus sounded to 7 cm. Minimal tissue removed. No complications. Hemostasis excellent.   Post-procedure evaluation the patient was stable without complaints.    Lab Review None  Radiologic Imaging: None    Assessment:  Regina Mathis is a 75 y.o. female diagnosed with thickened endometrial stripe, suspect endometrial polyp.  Medical co-morbidities complicating care: multiple as listed below.   Diagnosis Date  . Arthritis   . Cataracts, bilateral   . Chronic diastolic congestive heart failure ([place])   . Enlarged heart   . GERD (gastroesophageal reflux disease)   . Hernia of abdominal cavity   . Hyperlipidemia   . Hypertension   . Osteoarthritis   . Valvular heart disease    Echocardiogram 2014- moderate mitral regurg, moderate tricuspid regurg, moderate dilated left atrium and right atrium, EF 50-55%      Plan:   Problem List Items Addressed This Visit      Other   Abnormal finding on radiology exam - Primary      Endometrial biopsy performed today. Plan for follow up after biopsy results.   Suggested return to clinic in  2 weeks.    The patient's diagnosis, an outline of the further diagnostic and laboratory studies which will be required, the recommendation, and  alternatives were discussed.  All questions were answered to the patient's satisfaction.  A total of 50 minutes were spent with the patient/family today; 50% was spent in education, counseling and coordination of care for thickened endometrial stripe. Gillis Ends, MD    CC:  Glendon Axe, MD [facility] [place], [place] [address] [phone]

## 2016-09-23 ENCOUNTER — Telehealth: Payer: Self-pay

## 2016-09-23 ENCOUNTER — Telehealth: Payer: Self-pay | Admitting: Obstetrics and Gynecology

## 2016-09-23 LAB — SURGICAL PATHOLOGY

## 2016-09-23 NOTE — Telephone Encounter (Signed)
  Oncology Nurse Navigator Documentation Attempted to notify of negative endometrial biopsy results. No voicemail available on home or mobile device. Navigator Location: [facility] (09/23/16 1400)   )Navigator Encounter Type: Diagnostic Results (09/23/16 1400)                     Patient Visit Type: GynOnc (09/23/16 1400)                              Time Spent with Patient: 15 (09/23/16 1400)

## 2016-09-23 NOTE — Telephone Encounter (Signed)
I contacted the patient with her results as noted below. We will discuss treatment options when I see her on 10/07/2015. One option is SIS to assess if she has a polyp. Given asymptomaticstate postmenopausal polyps are unlikely to be malignant and observation is an option after discussion with the patient based on AAGL guidelines (AAGL Practice Report: Practice Guidelines for the Diagnosis and Management of Endometrial Polyps). Another option is hysteroscopic Myosure D&C with polyp removal. We will review further at her next visit.    DIAGNOSIS:  A. ENDOMETRIUM; BIOPSY:  - RARE STRIPS AND FRAGMENTS OF ATROPHIC APPEARING ENDOMETRIAL TISSUE.  - ABUNDANT MUCIN AND SCANT ENDOCERVICAL TISSUE.   Gillis Ends, MD

## 2016-09-30 ENCOUNTER — Ambulatory Visit: Payer: Commercial Managed Care - HMO

## 2016-10-06 ENCOUNTER — Encounter: Payer: Self-pay | Admitting: Obstetrics and Gynecology

## 2016-10-06 ENCOUNTER — Inpatient Hospital Stay: Payer: Medicare HMO | Attending: Obstetrics and Gynecology | Admitting: Obstetrics and Gynecology

## 2016-10-06 VITALS — BP 146/68 | HR 64 | Temp 98.2°F | Ht 61.0 in | Wt 178.6 lb

## 2016-10-06 DIAGNOSIS — R938 Abnormal findings on diagnostic imaging of other specified body structures: Secondary | ICD-10-CM

## 2016-10-06 DIAGNOSIS — I5032 Chronic diastolic (congestive) heart failure: Secondary | ICD-10-CM | POA: Diagnosis not present

## 2016-10-06 DIAGNOSIS — Z79899 Other long term (current) drug therapy: Secondary | ICD-10-CM | POA: Diagnosis not present

## 2016-10-06 DIAGNOSIS — K219 Gastro-esophageal reflux disease without esophagitis: Secondary | ICD-10-CM | POA: Insufficient documentation

## 2016-10-06 DIAGNOSIS — E785 Hyperlipidemia, unspecified: Secondary | ICD-10-CM | POA: Insufficient documentation

## 2016-10-06 DIAGNOSIS — Z780 Asymptomatic menopausal state: Secondary | ICD-10-CM | POA: Diagnosis not present

## 2016-10-06 DIAGNOSIS — I110 Hypertensive heart disease with heart failure: Secondary | ICD-10-CM | POA: Insufficient documentation

## 2016-10-06 DIAGNOSIS — R9389 Abnormal findings on diagnostic imaging of other specified body structures: Secondary | ICD-10-CM

## 2016-10-06 DIAGNOSIS — M1990 Unspecified osteoarthritis, unspecified site: Secondary | ICD-10-CM | POA: Insufficient documentation

## 2016-10-06 DIAGNOSIS — I38 Endocarditis, valve unspecified: Secondary | ICD-10-CM | POA: Diagnosis not present

## 2016-10-06 NOTE — Patient Instructions (Signed)
Appointment at [facility] on February 5th at 3:30pm for sonohystogram

## 2016-10-06 NOTE — Progress Notes (Signed)
Patient here for follow up. Anxious about diagnosis. She states she has continuous lower rt sided abdominal pain. She also has chronic back pain.

## 2016-10-06 NOTE — Progress Notes (Signed)
Gynecologic Oncology Interval Visit   Referring Provider: Glendon Axe, MD [facility] [place], [place] [address] [phone]   Chief Concern: thickened endometrial stripe   Subjective:  Regina Mathis is a 76 y.o. female who is seen in consultation from Dr. Candiss Norse for thickened endometrial stripe. She presents today regarding management options as her EMBx is negative for malignany, positive for atrophy.   I contacted the patient with her results as noted below. One option is SIS to assess if she has a polyp. Given asymptomaticstate postmenopausal polyps are unlikely to be malignant and observation is an option after discussion with the patient based on AAGL guidelines (AAGL Practice Report: Practice Guidelines for the Diagnosis and Management of Endometrial Polyps). Another option is hysteroscopic Myosure D&C with polyp removal.   DIAGNOSIS:  A. ENDOMETRIUM; BIOPSY:  - RARE STRIPS AND FRAGMENTS OF ATROPHIC APPEARING ENDOMETRIAL TISSUE.  - ABUNDANT MUCIN AND SCANT ENDOCERVICAL TISSUE.   Regina Ends, MD    Gynecologic Oncology History Regina Mathis is a 76 y.o. female who is seen in consultation from Dr. Candiss Norse for thickened endometrial stripe.   She had a CT scan on 08/27/2016 to evaluate chronic bladder infections and microscopic Hematuria. She has no complaints of vaginal bleeding.   CT scan abdomen/pelvis IMPRESSION: 1. Nonobstructive right renal staghorn calculi. No other specific cause for hematuria identified. 2. 3 by 5 mm right middle lobe pulmonary nodule. No follow-up needed if patient is low-risk. Non-contrast chest CT can be considered in 12 months if patient is high-risk. This recommendation follows the consensus statement: Guidelines for Management of Incidental Pulmonary Nodules Detected on CT Images: From the Fleischner Society 2017; Radiology 2017; 284:228-243. 3. **An incidental finding of potential clinical significance  has been found. Abnormal thickened endometrium, 1.8 cm in thickness, heterogeneous. Pelvic sonography recommended for definitive characterization and to exclude endometrial mass/malignancy.** 4. Prominent lumbar scoliosis, spondylosis, and degenerative disc disease causing multilevel impingement. 5. Subcutaneous, omental, and likely mild mesenteric edema suggesting third spacing of fluid. 6. Morgagni hernia containing adipose tissue. 7. 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 8. Minimal intrahepatic biliary dilatation with CBD up to 1.2 cm, much of this may be a physiologic response to cholecystectomy. 9. Sigmoid colon diverticulosis. These results will be called to the ordering clinician or representative by the Radiologist Assistant, and communication documented in the PACS or zVision Dashboard.  Pelvic Ultrasound transvaginal/transabdominal COMPARISON:  CT Abdomen and Pelvis 08/27/2016. FINDINGS: Uterus: Measurements: 5.2 x 2.8 x 4.1 cm. No fibroids or other mass visualized. Endometrium: Thickness: 15 mm (image 59). Severely heterogeneous (image 41) with some vascular internal architecture ( image 54). Right ovary: Measurements: No definite right ovarian parenchyma identified. However, on transabdominal images there is a 3.3 cm cystic, loculated appearing area near the iliac vessels (images 26 through 31). Although there appear to be internal septations there is no internal vascularity evident on Doppler. Left ovary: Measurements: Not visualized. Other findings No abnormal free fluid.  IMPRESSION: 1. Confirmed Abnormally thickened (15 mm) and heterogeneous endometrium, suspicious for carcinoma. GYN surgery consultation and Endometrial Sampling recommended. 2. Left ovary not visualized. 3.3 cm area of mildly loculated but otherwise simple appearing cystic change in the region of the right ovary. 3. No pelvic free fluid.  Normal appearing uterine  myometrium. These results will be called to the ordering clinician or representative by the Radiologist Assistant, and communication documented in the PACS or zVision Dashboard.  Jehovah's Witness - refuses blood transfusions.  Problem List: Patient Active Problem List   Diagnosis Date Noted  . Thickened endometrium 10/06/2016  . Abnormal finding on radiology exam 09/22/2016    Past Medical History: Past Medical History:  Diagnosis Date  . Arthritis   . Cataracts, bilateral   . Chronic diastolic congestive heart failure ([place])   . Enlarged heart   . GERD (gastroesophageal reflux disease)   . Hernia of abdominal cavity   . Hyperlipidemia   . Hypertension   . Osteoarthritis   . Valvular heart disease    Echocardiogram 2014- moderate mitral regurg, moderate tricuspid regurg, moderate dilated left atrium and right atrium, EF 50-55%    Past Surgical History: Past Surgical History:  Procedure Laterality Date  . CATARACT EXTRACTION W/ INTRAOCULAR LENS  IMPLANT, BILATERAL    . CHOLECYSTECTOMY    . COLONOSCOPY WITH PROPOFOL N/A 01/16/2016   Procedure: COLONOSCOPY WITH PROPOFOL;  Surgeon: [name], MD;  Location: [facility] ENDOSCOPY;  Service: Endoscopy;  Laterality: N/A;  . HERNIA REPAIR     with Bowel Involvement (04/2012)  . 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 Lumpectomy Chest Area    . TOTAL KNEE ARTHROPLASTY Bilateral     Past Gynecologic History:  See interval history.  No abnormal Pap smears. Last Pap 2015 normal   OB History: P0  Family History: Family History  Problem Relation Age of Onset  . CAD Mother   . Diabetes Mellitus II Mother   . Stroke Mother   . Lung cancer Father   . Diabetes Mellitus II Sister   . Diabetes Mellitus II Brother     Social History: Social History   Social History  . Marital status: Widowed    Spouse name: N/A  . Number of children: N/A  . Years of education: N/A    Occupational History  . retired    Social History Main Topics  . Smoking status: Never Smoker  . Smokeless tobacco: Never Used  . Alcohol use No  . Drug use: No  . Sexual activity: Not on file   Other Topics Concern  . Not on file   Social History Narrative  . No narrative on file    Allergies: No Known Allergies  Current Medications: Current Outpatient Prescriptions  Medication Sig Dispense Refill  . ascorbic acid (VITAMIN C) 1000 MG tablet Take 1,000 mg by mouth daily.    . Biotin 1 MG CAPS Take 1 mg by mouth daily.    . CHOLECALCIFEROL PO Take 1,000 Units by mouth daily.    . furosemide (LASIX) 20 MG tablet Take 20 mg by mouth.    Marland Kitchen lisinopril-hydrochlorothiazide (PRINZIDE,ZESTORETIC) 20-25 MG per tablet Take 1 tablet by mouth daily.    . Omega 3-6-9 Fatty Acids (OMEGA 3-6-9 COMPLEX PO) Take 1,200 mg by mouth daily.    . ranitidine (ZANTAC) 150 MG tablet Take 150 mg by mouth 2 (two) times daily.    . simvastatin (ZOCOR) 40 MG tablet Take 40 mg by mouth daily.    . traMADol (ULTRAM) 50 MG tablet Take by mouth every 6 (six) hours as needed.     No current facility-administered medications for this visit.     Review of Systems General: positive for fatigue and weakness, negative for, fevers Eyes: positive for visual issues s/p corneal transplant HEENT: hearing loss Pulmonary: cough Cardiac: negative for, palpitations, pain, discomfort Gastrointestinal: positive for abdominal pain, negative for, nausea, vomiting, constipation, diarrhea  Genitourinary/Sexual: incontinence, frequency,  microscopic hematuria Ob/Gyn: negative for, irregular bleeding, pain Musculoskeletal: pain, swelling legs, joints, and back Hematology: easy bruising Neurologic/Psych: weakness  Objective:  Physical Examination:  BP (!) 146/68 (BP Location: Left Arm, Patient Position: Sitting)   Pulse 64   Temp 98.2 F (36.8 C) (Tympanic)   Ht 5\' 1"  (1.549 m) Comment: stated wt  Wt 178 lb 9.2 oz  (81 kg)   BMI 33.74 kg/m    ECOG Performance Status: 1 - Symptomatic but completely ambulatory  Exam deferred.   Exam at the time of consulation noted below.  General appearance: alert, cooperative and appears stated age, walking with walker due to back issues HEENT:PERRLA, extra ocular movement intact and sclera clear, anicteric Lymph node survey: non-palpable, axillary, inguinal, supraclavicular Cardiovascular: regular rate and rhythm Respiratory: normal air entry, lungs clear to auscultation Breast exam: not examined. Abdomen: soft, non-tender, without masses or organomegaly, no hernias or ascites Back: inspection of back is normal except vertical incision Extremity: normal Neurological exam reveals alert, oriented, normal speech, no focal findings noted.  Pelvic: exam chaperoned by nurse;  Vulva: normal appearing vulva with no masses, tenderness or lesions; Vagina: normal vagina; Adnexa: not palpated, nontender and no masses; Uterus: uterus is normal size, shape, consistency and nontender; Cervix: no lesions; Rectal: normal rectal, no masses   Lab Review None  Radiologic Imaging: None    Assessment:  Regina Mathis is a 76 y.o. female diagnosed with thickened endometrial stripe, EMBx consistent with atrophy, but nondiagnostic for thickened stripe, suspect endometrial polyp.  Medical co-morbidities complicating care: multiple as listed below.   Diagnosis Date  . Arthritis   . Cataracts, bilateral   . Chronic diastolic congestive heart failure ([place])   . Enlarged heart   . GERD (gastroesophageal reflux disease)   . Hernia of abdominal cavity   . Hyperlipidemia   . Hypertension   . Osteoarthritis   . Valvular heart disease    Echocardiogram 2014- moderate mitral regurg, moderate tricuspid regurg, moderate dilated left atrium and right atrium, EF 50-55%      Plan:   Problem List Items Addressed This Visit      Other   Thickened endometrium - Primary     I  counseling Ms. Henrene Pastor regarding management options which include SIS to assess if she has a polyp vs hysteroscopic Myosure D&C with polyp removal. We briefly reviewed the benefits and risks of each approach. Given her asymptomatic state and medical comorbidities I have recommended SIS/sonohysterography over surgical evaluation. I have referred her to Dr. Leonides Schanz for the procedure. She agrees with this plan.   Suggested return to clinic after the procedure with Dr. Leonides Schanz.    The patient's diagnosis, an outline of the further diagnostic and laboratory studies which will be required, the recommendation, and alternatives were discussed.  All questions were answered to the patient's satisfaction.   Regina Ends, MD    CC:  Glendon Axe, MD [facility] [place], [place] [address] [phone]

## 2016-11-09 ENCOUNTER — Encounter
Admission: RE | Admit: 2016-11-09 | Discharge: 2016-11-09 | Disposition: A | Discharge status: Home / Self Care | Payer: Medicare HMO | Source: Ambulatory Visit | Attending: Obstetrics & Gynecology | Admitting: Obstetrics & Gynecology

## 2016-11-09 ENCOUNTER — Ambulatory Visit
Admission: RE | Admit: 2016-11-09 | Discharge: 2016-11-09 | Disposition: A | Discharge status: Home / Self Care | Payer: Medicare HMO | Source: Ambulatory Visit | Attending: Obstetrics & Gynecology | Admitting: Obstetrics & Gynecology

## 2016-11-09 DIAGNOSIS — Z01818 Encounter for other preprocedural examination: Secondary | ICD-10-CM | POA: Insufficient documentation

## 2016-11-09 DIAGNOSIS — Z01812 Encounter for preprocedural laboratory examination: Secondary | ICD-10-CM | POA: Diagnosis not present

## 2016-11-09 DIAGNOSIS — M419 Scoliosis, unspecified: Secondary | ICD-10-CM | POA: Diagnosis not present

## 2016-11-09 DIAGNOSIS — N859 Noninflammatory disorder of uterus, unspecified: Secondary | ICD-10-CM | POA: Diagnosis not present

## 2016-11-09 DIAGNOSIS — I509 Heart failure, unspecified: Secondary | ICD-10-CM

## 2016-11-09 HISTORY — DX: Prediabetes: R73.03

## 2016-11-09 HISTORY — DX: Scoliosis, unspecified: M41.9

## 2016-11-09 LAB — CBC
HEMATOCRIT: 36.8 % (ref 35.0–47.0)
Hemoglobin: 12.7 g/dL (ref 12.0–16.0)
MCH: 30.5 pg (ref 26.0–34.0)
MCHC: 34.6 g/dL (ref 32.0–36.0)
MCV: 88.1 fL (ref 80.0–100.0)
Platelets: 179 10*3/uL (ref 150–440)
RBC: 4.17 MIL/uL (ref 3.80–5.20)
RDW: 14.2 % (ref 11.5–14.5)
WBC: 4.1 10*3/uL (ref 3.6–11.0)

## 2016-11-09 LAB — BASIC METABOLIC PANEL
ANION GAP: 10 (ref 5–15)
BUN: 28 mg/dL — AB (ref 6–20)
CO2: 28 mmol/L (ref 22–32)
Calcium: 9.4 mg/dL (ref 8.9–10.3)
Chloride: 97 mmol/L — ABNORMAL LOW (ref 101–111)
Creatinine, Ser: 0.94 mg/dL (ref 0.44–1.00)
GFR calc Af Amer: >60 mL/min (ref >60)
GFR calc non Af Amer: 58 mL/min — ABNORMAL LOW (ref >60)
Glucose, Bld: 99 mg/dL (ref 65–99)
POTASSIUM: 3.4 mmol/L — AB (ref 3.5–5.1)
Sodium: 135 mmol/L (ref 135–145)

## 2016-11-09 NOTE — Pre-Procedure Instructions (Signed)
Met B results sent to Dr. Leonides Schanz and Anesthesia for review.

## 2016-11-09 NOTE — Pre-Procedure Instructions (Signed)
During PAT visit today pt reported she was diagnosed with shingles on 11/08/16 and started on Valtrex for 7 days.  Rash noted on right shoulder, back of right arm and right chest, all sores are crusted over, no drainage noted.    Pt has an appt with PCP Dr. Candiss Norse on 11/15/16 prior to surgery.  Spoke with Sharmon Leyden RN regarding above note.  Pt is OK to have surgery as long as shingle sores are crusted over with no drainage.

## 2016-11-09 NOTE — Patient Instructions (Signed)
  Your procedure is scheduled [id number] Feb. 23, 2018. Report to Same Day Surgery. To find out your arrival time please call [phone] between 1PM - 3PM on Thursday Feb. 22, 2018.  Remember: Instructions that are not followed completely may result in serious medical risk, up to and including death, or upon the discretion of your surgeon and anesthesiologist your surgery may need to be rescheduled.    _x___ 1. Do not eat food or drink liquids after midnight. No gum chewing or hard candies.     ____ 2. No Alcohol for 24 hours before or after surgery.   ____ 3. Bring all medications with you on the day of surgery if instructed.    __x__ 4. Notify your doctor if there is any change in your medical condition     (cold, fever, infections).    _____ 5. No smoking 24 hours prior to surgery.     Do not wear jewelry, make-up, hairpins, clips or nail polish.  Do not wear lotions, powders, or perfumes.   Do not shave 48 hours prior to surgery. Men may shave face and neck.  Do not bring valuables to the hospital.    [facility] is not responsible for any belongings or valuables.               Contacts, dentures or bridgework may not be worn into surgery.  Leave your suitcase in the car. After surgery it may be brought to your room.  For patients admitted to the hospital, discharge time is determined by your treatment team.   Patients discharged the day of surgery will not be allowed to drive home.    Please read over the following fact sheets that you were given:   [facility] Preparing for Surgery  __x__ Take these medicines the morning of surgery with A SIP OF WATER:    1. ranitidine (ZANTAC) take the night before and the morning of surgery.   ____ Fleet Enema (as directed)   ____ Use CHG Soap as directed on instruction sheet  ____ Use inhalers on the day of surgery and bring to hospital day of surgery  ____ Stop metformin 2 days prior to surgery    ____ Take 1/2 of usual  insulin dose the night before surgery and none on the morning of surgery.   ____ Stop Coumadin/Plavix/aspirin on does not apply.  _x___ Stop Anti-inflammatories such as Advil, Aleve, Ibuprofen, Motrin, Naproxen, Naprosyn, Goodies powders or aspirin products. OK to take Tylenol or  Tramadol.   __x__ Stop supplements:Biotin,CRANBERRY-VITAMIN C, Omega-3 Fatty Acids ([employer] FISH OIL),vitamin C (ASCORBIC ACID)    until after surgery.    ____ Bring C-Pap to the hospital.

## 2016-11-10 NOTE — Pre-Procedure Instructions (Signed)
REQUEST FOR MEDICAL CLEARANCE/ EKG CALLED AND FAXED TO DR Lenna Sciara [facility] AND DR WARD. SPOKE TO DAWN AT DR [facility] AND LAURA AT DR [facility]

## 2016-11-19 ENCOUNTER — Inpatient Hospital Stay: Admission: RE | Admit: 2016-11-19 | Payer: Medicare HMO | Source: Ambulatory Visit | Admitting: Obstetrics & Gynecology

## 2016-11-19 SURGERY — DILATATION AND CURETTAGE /HYSTEROSCOPY
Anesthesia: Choice

## 2017-01-06 ENCOUNTER — Encounter
Admission: RE | Admit: 2017-01-06 | Discharge: 2017-01-06 | Disposition: A | Discharge status: Home / Self Care | Payer: Medicare HMO | Source: Ambulatory Visit | Attending: Obstetrics & Gynecology | Admitting: Obstetrics & Gynecology

## 2017-01-06 DIAGNOSIS — N859 Noninflammatory disorder of uterus, unspecified: Secondary | ICD-10-CM | POA: Insufficient documentation

## 2017-01-06 DIAGNOSIS — Z01812 Encounter for preprocedural laboratory examination: Secondary | ICD-10-CM | POA: Insufficient documentation

## 2017-01-06 NOTE — Pre-Procedure Instructions (Signed)
CLEARED BY DR Nehemiah Massed 11/29/16 ON CHART

## 2017-01-06 NOTE — Patient Instructions (Signed)
  Your procedure is scheduled on: 01/14/17 Report to Day Surgery. [facility] SECOND FLOOR To find out your arrival time please call [phone] between 1PM - 3PM on 01/13/17  Remember: Instructions that are not followed completely may result in serious medical risk, up to and including death, or upon the discretion of your surgeon and anesthesiologist your surgery may need to be rescheduled.    __X__ 1. Do not eat food or drink liquids after midnight. No gum chewing or hard candies.     __X__ 2. No Alcohol for 24 hours before or after surgery.   _X___ 3. Do Not Smoke For 24 Hours Prior to Your Surgery.   ____ 4. Bring all medications with you on the day of surgery if instructed.    __X__ 5. Notify your doctor if there is any change in your medical condition     (cold, fever, infections).       Do not wear jewelry, make-up, hairpins, clips or nail polish.  Do not wear lotions, powders, or perfumes. You may wear deodorant.  Do not shave 48 hours prior to surgery. Men may shave face and neck.  Do not bring valuables to the hospital.    [facility] is not responsible for any belongings or valuables.               Contacts, dentures or bridgework may not be worn into surgery.  Leave your suitcase in the car. After surgery it may be brought to your room.  For patients admitted to the hospital, discharge time is determined by your                treatment team.   Patients discharged the day of surgery will not be allowed to drive home.      __X__ Take these medicines the morning of surgery with A SIP OF WATER:    1.RANITIDINE AT BEDTIME 01/13/17 AND MORNING OF SURGERY  2.   3.   4.  5.  6.  ____ Fleet Enema (as directed)   ____ Use CHG Soap as directed  ____ Use inhalers on the day of surgery  ____ Stop metformin 2 days prior to surgery    ____ Take 1/2 of usual insulin dose the night before surgery and none on the morning of surgery.   __X__ Stop Coumadin/Plavix/aspirin  on         STOP ELIQUIS 3 DAYS BEFORE SURGERY ____ Stop Anti-inflammatories on    ____ Stop supplements until after surgery.    ____ Bring C-Pap to the hospital.

## 2017-01-07 ENCOUNTER — Encounter
Admission: RE | Admit: 2017-01-07 | Discharge: 2017-01-07 | Disposition: A | Discharge status: Home / Self Care | Payer: Medicare HMO | Source: Ambulatory Visit | Attending: Obstetrics & Gynecology | Admitting: Obstetrics & Gynecology

## 2017-01-07 DIAGNOSIS — Z01812 Encounter for preprocedural laboratory examination: Secondary | ICD-10-CM | POA: Diagnosis present

## 2017-01-07 DIAGNOSIS — N859 Noninflammatory disorder of uterus, unspecified: Secondary | ICD-10-CM | POA: Diagnosis not present

## 2017-01-07 LAB — CBC
HCT: 37.3 % (ref 35.0–47.0)
Hemoglobin: 12.9 g/dL (ref 12.0–16.0)
MCH: 30.9 pg (ref 26.0–34.0)
MCHC: 34.7 g/dL (ref 32.0–36.0)
MCV: 89.3 fL (ref 80.0–100.0)
PLATELETS: 206 10*3/uL (ref 150–440)
RBC: 4.18 MIL/uL (ref 3.80–5.20)
RDW: 14.5 % (ref 11.5–14.5)
WBC: 4 10*3/uL (ref 3.6–11.0)

## 2017-01-07 LAB — BASIC METABOLIC PANEL
Anion gap: 9 (ref 5–15)
BUN: 25 mg/dL — AB (ref 6–20)
CALCIUM: 9.2 mg/dL (ref 8.9–10.3)
CO2: 30 mmol/L (ref 22–32)
CREATININE: 0.8 mg/dL (ref 0.44–1.00)
Chloride: 94 mmol/L — ABNORMAL LOW (ref 101–111)
GFR calc non Af Amer: >60 mL/min (ref >60)
Glucose, Bld: 129 mg/dL — ABNORMAL HIGH (ref 65–99)
Potassium: 3.2 mmol/L — ABNORMAL LOW (ref 3.5–5.1)
SODIUM: 133 mmol/L — AB (ref 135–145)

## 2017-01-13 ENCOUNTER — Ambulatory Visit
Admission: RE | Admit: 2017-01-13 | Discharge: 2017-01-13 | Disposition: A | Discharge status: Home / Self Care | Payer: Medicare HMO | Source: Ambulatory Visit | Attending: Internal Medicine | Admitting: Internal Medicine

## 2017-01-13 ENCOUNTER — Encounter: Payer: Self-pay | Admitting: Radiology

## 2017-01-13 DIAGNOSIS — Z1231 Encounter for screening mammogram for malignant neoplasm of breast: Secondary | ICD-10-CM | POA: Diagnosis present

## 2017-01-13 NOTE — Pre-Procedure Instructions (Signed)
Progress Notes - in this encounter  Ward, Juliane Lack, MD - 12/31/2016 2:30 PM EDT Formatting of this note may be different from the original. Regina Mathis returns today to restart the process to bring her to the OR for a D&C. She was previously seen by me in consultation and I had requested clearance, which was delayed. She was seen by her cardiologist, Dr. Nehemiah Massed, and he has now cleared her for surgery :  Preoperative Assessment Profile for gynocologic surgery Risk factors for cardiac complication with surgery: Positive for: none Negative for: Diabetes, Cardiomyopathy or chf, Coronary artery disease, Chronic Kidney disease and Peripheral vascular disease Active cardiovascular conditions: Positive HCW:[id number] Negative for: Angina or anginal equivalent, Recent infartion, Congestive heart failure symptoms and Symptomatic valve disease Functional capacity <4 Mets" Risk of surgery and/or procedure low risk Possible need and adjustments in therapy prior to surgery no Overall risk of cardiac complication with surgery and/or procedure is low, <1%  She still wishes to continue with proposed Hysteroscopy, Dilation and Curettage.  She still refuses blood and blood products  Thickened endometrial stripe (incidental)  HPI: Regina Mathis is a 76 y.o. P0 who is seeing me today at the request of Dr. Theora Gianotti, GYN ONC from [facility]. She was initially referred to Dr. Theora Gianotti from her PCP Dr. Candiss Norse due to thickened endometrium found incidentally on CT scan for recurrent bladder infections, and is s/p negative EMB.   She denies vaginal bleeding, but occasional rectal BRB.   Today's ultrasound showed a uterus Anteverted Endometrium with fluid and mass 1.5cm complex and vascular No adnexal masses neither ovary visualized  Saline ultrasound not performed due to mass seen  DIAGNOSIS:  A. ENDOMETRIUM; BIOPSY:  - RARE STRIPS AND FRAGMENTS OF ATROPHIC APPEARING ENDOMETRIAL TISSUE.  - ABUNDANT  MUCIN AND SCANT ENDOCERVICAL TISSUE.   CT scan abdomen/pelvis IMPRESSION: 1. Nonobstructive right renal staghorn calculi. No other specific cause for hematuria identified. 2. 3 by 5 mm right middle lobe pulmonary nodule. No follow-up needed if patient is low-risk. Non-contrast chest CT can be considered in 12 months if patient is high-risk. This recommendation follows the consensus statement: Guidelines for Management of Incidental Pulmonary Nodules Detected on CT Images: From the Fleischner Society 2017; Radiology 2017; 284:228-243. 3. **An incidental finding of potential clinical significance has been found. Abnormal thickened endometrium, 1.8 cm in thickness, heterogeneous. Pelvic sonography recommended for definitive characterization and to exclude endometrial mass/malignancy.** 4. Prominent lumbar scoliosis, spondylosis, and degenerative disc disease causing multilevel impingement. 5. Subcutaneous, omental, and likely mild mesenteric edema suggesting third spacing of fluid. 6. Morgagni hernia containing adipose tissue. 7. 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 8. Minimal intrahepatic biliary dilatation with CBD up to 1.2 cm, much of this may be a physiologic response to cholecystectomy. 9. Sigmoid colon diverticulosis. These results will be called to the ordering clinician or representative by the Radiologist Assistant, and communication documented in the PACS or zVision Dashboard.  Pelvic Ultrasound transvaginal/transabdominal COMPARISON: CT Abdomen and Pelvis 08/27/2016. FINDINGS: Uterus: Measurements: 5.2 x 2.8 x 4.1 cm. No fibroids or other mass visualized. Endometrium: Thickness: 15 mm (image 59). Severely heterogeneous (image 41) with some vascular internal architecture ( image 54). Right ovary: Measurements: No definite right ovarian parenchyma identified. However, on transabdominal images there is a 3.3 cm cystic, loculated appearing area  near the iliac vessels (images 26 through 31). Although there appear to be internal septations there is no internal vascularity evident on Doppler. Left ovary: Measurements: Not visualized. Other findings No  abnormal free fluid.  IMPRESSION: 1. Confirmed Abnormally thickened (15 mm) and heterogeneous endometrium, suspicious for carcinoma. GYN surgery consultation and Endometrial Sampling recommended. 2. Left ovary not visualized. 3.3 cm area of mildly loculated but otherwise simple appearing cystic change in the region of the right ovary. 3. No pelvic free fluid. Normal appearing uterine myometrium. These results will be called to the ordering clinician or representative by the Radiologist Assistant, and communication documented in the PACS or zVision Dashboard.   No LMP recorded. Patient is postmenopausal.   Problem List Date Reviewed: 2017/01/01  Codes Priority Class Noted - Resolved  Paroxysmal A-fib (CMS-HCC) ICD-10-CM: I48.0 ICD-9-CM: 427.31 11/19/2016 - Present  Increased endometrial stripe thickness ICD-10-CM: R93.8 ICD-9-CM: 793.5 Unknown - Present  Thickened endometrium ICD-10-CM: R93.8 ICD-9-CM: 793.5 10/06/2016 - Present  Endometrial thickening on ultra sound ICD-10-CM: R93.8 ICD-9-CM: 793.5 09/09/2016 - Present  Overview Signed 09/09/2016 3:29 PM by Glendon Axe, MD  Referred to GYN and oncology   Positive fecal occult blood test ICD-10-CM: R19.5 ICD-9-CM: 792.1 11/20/2015 - Present  Gastroesophageal reflux disease without esophagitis ICD-10-CM: K21.9 ICD-9-CM: 530.81 11/20/2015 - Present  Pure hypercholesterolemia ICD-10-CM: E78.00 ICD-9-CM: 272.0 11/20/2015 - Present  Essential hypertension ICD-10-CM: I10 ICD-9-CM: 401.9 11/20/2015 - Present  Primary osteoarthritis involving multiple joints ICD-10-CM: M15.0 ICD-9-CM: 715.09 11/20/2015 - Present     Past Medical History: has a past medical history of Arthritis; Cataracts, bilateral; Enlarged heart; GERD  (gastroesophageal reflux disease); Hernia of abdominal cavity; Hyperlipidemia, unspecified; Hypertension; and Tubular adenoma of colon, unspecified (01/16/2016).  Past Surgical History: has a past surgical history that includes Cholecystectomy; LUMPECTOMY CHEST AREA; Cataract extraction (Bilateral); Joint replacement (Left, 03/24/2011); Joint replacement (Right, 04/03/2012); Hernia repair (04/2012); and Colonoscopy (01/16/2016). Family History: family history includes Diabetes in her brother, mother, and sister; Heart disease in her mother; Lung cancer in her father; Stroke in her mother. Social History: reports that she has never smoked. She has never used smokeless tobacco. She reports that she does not drink alcohol or use drugs. OB/GYN History:  OB History  No data available    Allergies: has No Known Allergies. Medications:  Current Outpatient Prescriptions:  . apixaban (ELIQUIS) 5 mg tablet, Take 1 tablet (5 mg total) by mouth 2 (two) times daily., Disp: 60 tablet, Rfl: 5 . ascorbic acid (VITAMIN C) 1000 MG tablet, Take 1,000 mg by mouth once daily., Disp: , Rfl:  . aspirin 81 MG EC tablet, Take 81 mg by mouth once daily., Disp: , Rfl:  . biotin 1 mg tablet, Take 1,000 mcg by mouth once daily., Disp: , Rfl:  . cholecalciferol (CHOLECALCIFEROL) 1,000 unit tablet, Take 1,000 Units by mouth once daily., Disp: , Rfl:  . CRANBERRY FRUIT EXTRACT (CRANBERRY CONCENTRATE ORAL), Take 4,200 mg by mouth once daily., Disp: , Rfl:  . fish,bora,flax oils-om3,6,9 #1 (OMEGA 3-6-9) 1,200 mg Cap, Take 1 capsule by mouth once daily., Disp: , Rfl:  . FUROsemide (LASIX) 20 MG tablet, Take 1 tablet (20 mg total) by mouth once daily., Disp: 90 tablet, Rfl: 3 . lisinopril-hydrochlorothiazide (PRINZIDE,ZESTORETIC) 20-25 mg tablet, Take 1 tablet by mouth once daily., Disp: 90 tablet, Rfl: 3 . ranitidine (ZANTAC) 150 MG tablet, Take 1 tablet (150 mg total) by mouth once daily., Disp: 90 tablet, Rfl: 3 . simvastatin  (ZOCOR) 40 MG tablet, Take 1 tablet (40 mg total) by mouth nightly., Disp: 90 tablet, Rfl: 3 . traMADol (ULTRAM) 50 mg tablet, Take 1 tablet (50 mg total) by mouth 3 (three) times daily as needed for  Pain., Disp: 60 tablet, Rfl: 2  Review of Systems: General: No fatigue or weight loss Eyes: No vision changes Ears: No hearing difficulty Respiratory: No cough or shortness of breath Pulmonary: No asthma or shortness of breath Cardiovascular: No chest pain, palpitations, dyspnea on exertion Gastrointestinal: No abdominal bloating, chronic diarrhea, constipations, masses, pain or hematochezia Genitourinary: No hematuria, dysuria, abnormal vaginal discharge, pelvic pain, Menometrorrhagia Lymphatic: No swollen lymph nodes Musculoskeletal: No muscle weakness Neurologic: No extremity weakness, syncope, seizure disorder Psychiatric: No history of depression, delusions or suicidal/homicidal ideation  Exam:   BP 134/66  Pulse 57  Ht 157.5 cm (5\' 2" )  Wt 59.9 kg (132 lb)  BMI 24.14 kg/m   Body mass index is 24.14 kg/m.  WDWN white female in NAD  Lungs: Normal respiratory effort CV: RRR  Impression:   The primary encounter diagnosis was Uterine mass. A diagnosis of Preoperative exam for gynecologic surgery was also pertinent to this visit.  Plan:   - Recommend D&C with hysteroscopy and likely use of myosure for removal of uterine mass. - consents signed today with hopes to complete this before the end of the month. - Jehovah's witness, copied permission products and no blood contract. She is willing to accept albumin and other hemodilution products but not blood, platelets, FFP or cryo.  I personally performed the service. (TP)  CHELSEA CRIST WARD, MD  90% of this exam was spent in consultation 25 minutes were spent face-to-face with patient         Plan of Treatment - as of this encounter   Upcoming Encounters Upcoming Encounters  Date Type Specialty Care Team  Description  04/21/2017 Office Visit Cardiology Flossie Dibble, Jordan Comfort  [facility]  [place], [place] [address]  [phone]  [phone] (Fax)     Visit Diagnoses    Diagnosis  Uterine mass - Primary  Other specified symptom associated with female genital organs   Preoperative exam for gynecologic surgery   Images Document Information   Primary Care Provider Glendon Axe MD (Feb. 23, 2017 - Present) [phone] (Work) [phone] (Fax) [facility] [place], [place] [address]  Document Coverage Dates Apr. 06, 2018  [place], [place] [address]   Encounter Providers Juliane Lack Ward MD (Attending) [phone] (Work) [phone] (Fax) [place], [place] [address]   Encounter Date Apr. 06, 2018

## 2017-01-14 ENCOUNTER — Ambulatory Visit: Payer: Medicare HMO | Admitting: Certified Registered Nurse Anesthetist

## 2017-01-14 ENCOUNTER — Encounter
Admission: RE | Disposition: A | Discharge status: Home / Self Care | Payer: Self-pay | Source: Ambulatory Visit | Attending: Obstetrics & Gynecology

## 2017-01-14 ENCOUNTER — Ambulatory Visit
Admission: RE | Admit: 2017-01-14 | Discharge: 2017-01-14 | Disposition: A | Discharge status: Home / Self Care | Payer: Medicare HMO | Source: Ambulatory Visit | Attending: Obstetrics & Gynecology | Admitting: Obstetrics & Gynecology

## 2017-01-14 DIAGNOSIS — N840 Polyp of corpus uteri: Secondary | ICD-10-CM | POA: Diagnosis not present

## 2017-01-14 DIAGNOSIS — E7800 Pure hypercholesterolemia, unspecified: Secondary | ICD-10-CM | POA: Insufficient documentation

## 2017-01-14 DIAGNOSIS — I480 Paroxysmal atrial fibrillation: Secondary | ICD-10-CM | POA: Insufficient documentation

## 2017-01-14 DIAGNOSIS — Z7982 Long term (current) use of aspirin: Secondary | ICD-10-CM | POA: Insufficient documentation

## 2017-01-14 DIAGNOSIS — K219 Gastro-esophageal reflux disease without esophagitis: Secondary | ICD-10-CM | POA: Insufficient documentation

## 2017-01-14 DIAGNOSIS — E785 Hyperlipidemia, unspecified: Secondary | ICD-10-CM | POA: Insufficient documentation

## 2017-01-14 DIAGNOSIS — M150 Primary generalized (osteo)arthritis: Secondary | ICD-10-CM | POA: Insufficient documentation

## 2017-01-14 DIAGNOSIS — I10 Essential (primary) hypertension: Secondary | ICD-10-CM | POA: Diagnosis not present

## 2017-01-14 DIAGNOSIS — R938 Abnormal findings on diagnostic imaging of other specified body structures: Secondary | ICD-10-CM | POA: Diagnosis present

## 2017-01-14 HISTORY — PX: HYSTEROSCOPY W/D&C: SHX1775

## 2017-01-14 LAB — POCT I-STAT 4, (NA,K, GLUC, HGB,HCT)
Glucose, Bld: 96 mg/dL (ref 65–99)
HCT: 39 % (ref 36.0–46.0)
Hemoglobin: 13.3 g/dL (ref 12.0–15.0)
POTASSIUM: 5.2 mmol/L — AB (ref 3.5–5.1)
SODIUM: 135 mmol/L (ref 135–145)

## 2017-01-14 SURGERY — DILATATION AND CURETTAGE /HYSTEROSCOPY
Anesthesia: General

## 2017-01-14 MED ORDER — FENTANYL CITRATE (PF) 100 MCG/2ML IJ SOLN
25.0000 ug | INTRAMUSCULAR | Status: DC | PRN
  Administered 2017-01-14 (×4): 25 ug via INTRAVENOUS

## 2017-01-14 MED ORDER — FENTANYL CITRATE (PF) 100 MCG/2ML IJ SOLN
INTRAMUSCULAR | Status: AC
  Filled 2017-01-14: qty 2

## 2017-01-14 MED ORDER — KETOROLAC TROMETHAMINE 30 MG/ML IJ SOLN: 30.0000 mg | Freq: Four times a day (QID) | INTRAMUSCULAR | Status: DC

## 2017-01-14 MED ORDER — ONDANSETRON HCL 4 MG/2ML IJ SOLN: 4.0000 mg | Freq: Once | INTRAMUSCULAR | Status: DC | PRN

## 2017-01-14 MED ORDER — ONDANSETRON HCL 4 MG/2ML IJ SOLN
INTRAMUSCULAR | Status: DC | PRN
  Administered 2017-01-14: 4 mg via INTRAVENOUS

## 2017-01-14 MED ORDER — PROPOFOL 10 MG/ML IV BOLUS: INTRAVENOUS | Status: DC | PRN

## 2017-01-14 MED ORDER — SILVER NITRATE-POT NITRATE 75-25 % EX MISC
CUTANEOUS | Status: DC | PRN
  Administered 2017-01-14: 1 via TOPICAL

## 2017-01-14 MED ORDER — ACETAMINOPHEN 325 MG PO TABS: 650.0000 mg | ORAL_TABLET | ORAL | Status: DC | PRN

## 2017-01-14 MED ORDER — MORPHINE SULFATE (PF) 4 MG/ML IV SOLN: 1.0000 mg | INTRAVENOUS | Status: DC | PRN

## 2017-01-14 MED ORDER — PROPOFOL 10 MG/ML IV BOLUS
INTRAVENOUS | Status: DC | PRN
  Administered 2017-01-14: 20 mg via INTRAVENOUS
  Administered 2017-01-14: 120 mg via INTRAVENOUS
  Administered 2017-01-14: 20 mg via INTRAVENOUS

## 2017-01-14 MED ORDER — ONDANSETRON HCL 4 MG/2ML IJ SOLN
INTRAMUSCULAR | Status: AC
  Filled 2017-01-14: qty 2

## 2017-01-14 MED ORDER — GLYCOPYRROLATE 0.2 MG/ML IJ SOLN
INTRAMUSCULAR | Status: DC | PRN
  Administered 2017-01-14: 0.2 mg via INTRAVENOUS

## 2017-01-14 MED ORDER — LACTATED RINGERS IV SOLN
INTRAVENOUS | Status: DC
  Administered 2017-01-14: 09:00:00 via INTRAVENOUS

## 2017-01-14 MED ORDER — FENTANYL CITRATE (PF) 100 MCG/2ML IJ SOLN
INTRAMUSCULAR | Status: DC | PRN
  Administered 2017-01-14: 25 ug via INTRAVENOUS
  Administered 2017-01-14: 50 ug via INTRAVENOUS
  Administered 2017-01-14: 25 ug via INTRAVENOUS
  Administered 2017-01-14 (×2): 50 ug via INTRAVENOUS

## 2017-01-14 MED ORDER — LACTATED RINGERS IR SOLN
Status: DC | PRN
  Administered 2017-01-14: 1000 mL

## 2017-01-14 MED ORDER — LABETALOL HCL 5 MG/ML IV SOLN
INTRAVENOUS | Status: AC
  Filled 2017-01-14: qty 4

## 2017-01-14 MED ORDER — DEXAMETHASONE SODIUM PHOSPHATE 10 MG/ML IJ SOLN
INTRAMUSCULAR | Status: DC | PRN
  Administered 2017-01-14: 6 mg via INTRAVENOUS

## 2017-01-14 MED ORDER — LABETALOL HCL 5 MG/ML IV SOLN
INTRAVENOUS | Status: DC | PRN
  Administered 2017-01-14: 5 mg via INTRAVENOUS

## 2017-01-14 MED ORDER — PROPOFOL 10 MG/ML IV BOLUS
INTRAVENOUS | Status: AC
  Filled 2017-01-14: qty 20

## 2017-01-14 MED ORDER — ACETAMINOPHEN 650 MG RE SUPP
650.0000 mg | RECTAL | Status: DC | PRN
  Filled 2017-01-14: qty 1

## 2017-01-14 MED ORDER — DEXAMETHASONE SODIUM PHOSPHATE 10 MG/ML IJ SOLN
INTRAMUSCULAR | Status: AC
  Filled 2017-01-14: qty 1

## 2017-01-14 SURGICAL SUPPLY — 17 items
BAG INFUSER PRESSURE 100CC (MISCELLANEOUS) ×3 IMPLANT
ELECT REM PT RETURN 9FT ADLT (ELECTROSURGICAL) ×3
ELECTRODE REM PT RTRN 9FT ADLT (ELECTROSURGICAL) ×1 IMPLANT
GLOVE PI ORTHOPRO 6.5 (GLOVE) ×2
GLOVE PI ORTHOPRO STRL 6.5 (GLOVE) ×1 IMPLANT
GLOVE SURG SYN 6.5 ES PF (GLOVE) ×3 IMPLANT
GOWN STRL REUS W/ TWL LRG LVL3 (GOWN DISPOSABLE) ×2 IMPLANT
GOWN STRL REUS W/TWL LRG LVL3 (GOWN DISPOSABLE) ×4
IV LACTATED RINGERS 1000ML (IV SOLUTION) ×3 IMPLANT
KIT RM TURNOVER CYSTO AR (KITS) ×3 IMPLANT
NEEDLE SPNL 22GX3.5 QUINCKE BK (NEEDLE) ×3 IMPLANT
PACK DNC HYST (MISCELLANEOUS) ×3 IMPLANT
PAD OB MATERNITY 4.3X12.25 (PERSONAL CARE ITEMS) ×3 IMPLANT
PAD PREP 24X41 OB/GYN DISP (PERSONAL CARE ITEMS) ×3 IMPLANT
SYRINGE 10CC LL (SYRINGE) ×3 IMPLANT
TUBING CONNECTING 10 (TUBING) ×2 IMPLANT
TUBING CONNECTING 10' (TUBING) ×1

## 2017-01-14 NOTE — Anesthesia Postprocedure Evaluation (Signed)
Anesthesia Post Note  Patient: Regina Mathis  Procedure(s) Performed: Procedure(s) (LRB): DILATATION AND CURETTAGE /HYSTEROSCOPY (N/A)  Patient location during evaluation: PACU Anesthesia Type: General Level of consciousness: awake and alert Pain management: pain level controlled Vital Signs Assessment: post-procedure vital signs reviewed and stable Respiratory status: spontaneous breathing, nonlabored ventilation, respiratory function stable and patient connected to nasal cannula oxygen Cardiovascular status: blood pressure returned to baseline and stable Postop Assessment: no signs of nausea or vomiting Anesthetic complications: no     Last Vitals:  Vitals:   01/14/17 1200 01/14/17 1205  BP:  (!) 120/49  Pulse: (!) 47 (!) 57  Resp: 14 (!) 23  Temp:  36.2 C    Last Pain:  Vitals:   01/14/17 1205  TempSrc:   PainSc: [place]

## 2017-01-14 NOTE — Op Note (Signed)
Operative Report Hysteroscopy, Dilation and Curettage 01/14/2017  Patient:  Regina Mathis  76 y.o. female Preoperative diagnosis:  Uterine Mass, thickened endometrium Postoperative diagnosis:  Endometrial polyps  PROCEDURE:  Procedure(s): DILATATION AND CURETTAGE /HYSTEROSCOPY (N/A) Surgeon:  Surgeon(s) and Role:    * Tinie Mcgloin Loletha Grayer Dezerae Freiberger, MD - Primary Anesthesia:  LMA I/O: Total I/O In: 500 [I.V.:500] Out: 10 [Blood:10] Specimens:  Endometrial polyps Complications: None Apparent Disposition:  VS stable to PACU  Findings: Uterus, mobile, normal size, sounding to 7cm; normal cervix, vagina, perineum. Two pedunculated polyps, one from right lateral wall, one fundal.  Indication for procedure/Consents: 76 y.o. here for scheduled surgery for the aforementioned diagnoses.   Risks of surgery were discussed with the patient including but not limited to: bleeding which may require transfusion; infection which may require antibiotics; injury to uterus or surrounding organs; intrauterine scarring which may impair future fertility; need for additional procedures including laparotomy or laparoscopy; and other postoperative/anesthesia complications. Written informed consent was obtained.    Procedure Details:   The patient was then taken to the operating room where anesthesia was administered and was found to be adequate.  After a formal and adequate timeout was performed, she was placed in the dorsal lithotomy position and examined with the above findings. She was then prepped and draped in the sterile manner.  A speculum was then placed in the patient's vagina and a single tooth tenaculum was applied to the posterior lip of the cervix.   The uterus was sounded to 7cm. Her cervix was serially dilated to accommodate the myoscope, with findings as above. The myosure device was used to remove the two polyps. A sharp curettage was then performed until there was a gritty texture in all four quadrants, there  was no tissue removed. The specimen was handed off to nursing.  The camera was reinserted and confirmed the uterus had been evacuated. The tenaculum was removed from the anterior lip of the cervix and the vaginal speculum was removed after noting good hemostasis. The patient tolerated the procedure well and was taken to the recovery area awake, extubated and in stable condition.  The patient will be discharged to home as per PACU criteria.  Routine postoperative instructions given. She will follow up in the clinic in two to four weeks for postoperative evaluation.  Larey Days, MD [facility]YN Attending Gynecologist

## 2017-01-14 NOTE — Discharge Instructions (Signed)
You should expect to have some cramping and vaginal bleeding for about a week. This should taper off and subside, much like a period. If heavy bleeding continues or gets worse, you should contact the office for an earlier appointment.   Please call the office or physician on call for fever >101, severe pain, and heavy bleeding.   [place]!!   Restart your eliquis tomorrow  AMBULATORY SURGERY  DISCHARGE INSTRUCTIONS   1) The drugs that you were given will stay in your system until tomorrow so for the next 24 hours you should not:  A) Drive an automobile B) Make any legal decisions C) Drink any alcoholic beverage   2) You may resume regular meals tomorrow.  Today it is better to start with liquids and gradually work up to solid foods.  You may eat anything you prefer, but it is better to start with liquids, then soup and crackers, and gradually work up to solid foods.   3) Please notify your doctor immediately if you have any unusual bleeding, trouble breathing, redness and pain at the surgery site, drainage, fever, or pain not relieved by medication.    4) Additional Instructions:        Please contact your physician with any problems or Same Day Surgery at [phone], Monday through Friday 6 am to 4 pm, or [place] [facility] number at [phone].

## 2017-01-14 NOTE — Transfer of Care (Signed)
Immediate Anesthesia Transfer of Care Note  Patient: Regina Mathis  Procedure(s) Performed: Procedure(s): DILATATION AND CURETTAGE /HYSTEROSCOPY (N/A)  Patient Location: PACU  Anesthesia Type:General  Level of Consciousness: awake, alert , oriented and patient cooperative  Airway & Oxygen Therapy: Patient Spontanous Breathing and Patient connected to nasal cannula oxygen  Post-op Assessment: Report given to RN and Post -op Vital signs reviewed and stable  Post vital signs: Reviewed and stable  Last Vitals:  Vitals:   01/14/17 0906 01/14/17 1121  BP: 125/66 129/79  Pulse: (!) 49 60  Resp: 16 14  Temp: 36.6 C 36.4 C    Last Pain:  Vitals:   01/14/17 1121  TempSrc: Temporal  PainSc: 5          Complications: No apparent anesthesia complications

## 2017-01-14 NOTE — Anesthesia Procedure Notes (Signed)
Procedure Name: LMA Insertion Date/Time: 01/14/2017 10:31 AM Performed by: Darlyne Russian Pre-anesthesia Checklist: Patient identified, Emergency Drugs available, Suction available and Patient being monitored Patient Re-evaluated:Patient Re-evaluated prior to inductionOxygen Delivery Method: Circle system utilized Preoxygenation: Pre-oxygenation with 100% oxygen Intubation Type: IV induction Ventilation: Mask ventilation without difficulty LMA: LMA inserted LMA Size: 4.0 Number of attempts: 1 Placement Confirmation: breath sounds checked- equal and bilateral and positive ETCO2 Tube secured with: Tape Dental Injury: Teeth and Oropharynx as per pre-operative assessment

## 2017-01-14 NOTE — H&P (Signed)
[name] returns today to restart the process to bring her to the OR for a D&C. She was previously seen by me in consultation and I had requested clearance, which was delayed. She was seen by her cardiologist, Dr. Nehemiah Massed, and he has now cleared her for surgery :  Preoperative Assessment Profile for gynocologic surgery Risk factors for cardiac complication with surgery: Positive for: none Negative for: Diabetes, Cardiomyopathy or chf, Coronary artery disease, Chronic Kidney disease and Peripheral vascular disease Active cardiovascular conditions: Positive ZWC:HENIDPOEUMP Negative for: Angina or anginal equivalent, Recent infartion, Congestive heart failure symptoms and Symptomatic valve disease Functional capacity <4 Mets" Risk of surgery and/or procedure low risk Possible need and adjustments in therapy prior to surgery no Overall risk of cardiac complication with surgery and/or procedure is low, <1%  She still wishes to continue with proposed Hysteroscopy, Dilation and Curettage.  She still refuses blood and blood products  Thickened endometrial stripe (incidental)  HPI: Regina Mathis is a 76 y.o. P0 who is seeing me today at the request of Dr. Theora Gianotti, GYN ONC from [facility]. She was initially referred to Dr. Theora Gianotti from her PCP Dr. Candiss Norse due to thickened endometrium found incidentally on CT scan for recurrent bladder infections, and is s/p negative EMB.   She denies vaginal bleeding, but occasional rectal BRB.   Today's ultrasound showed a uterus Anteverted Endometrium with fluid and mass 1.5cm complex and vascular No adnexal masses neither ovary visualized  Saline ultrasound not performed due to mass seen  DIAGNOSIS:  A. ENDOMETRIUM; BIOPSY:  - RARE STRIPS AND FRAGMENTS OF ATROPHIC APPEARING ENDOMETRIAL TISSUE.  - ABUNDANT MUCIN AND SCANT ENDOCERVICAL TISSUE.   CT scan abdomen/pelvis IMPRESSION: 1. Nonobstructive right renal staghorn calculi. No other  specific cause for hematuria identified. 2. 3 by 5 mm right middle lobe pulmonary nodule. No follow-up needed if patient is low-risk. Non-contrast chest CT can be considered in 12 months if patient is high-risk. This recommendation follows the consensus statement: Guidelines for Management of Incidental Pulmonary Nodules Detected on CT Images: From the Fleischner Society 2017; Radiology 2017; 284:228-243. 3. **An incidental finding of potential clinical significance has been found. Abnormal thickened endometrium, 1.8 cm in thickness, heterogeneous. Pelvic sonography recommended for definitive characterization and to exclude endometrial mass/malignancy.** 4. Prominent lumbar scoliosis, spondylosis, and degenerative disc disease causing multilevel impingement. 5. Subcutaneous, omental, and likely mild mesenteric edema suggesting third spacing of fluid. 6. Morgagni hernia containing adipose tissue. 7. 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 8. Minimal intrahepatic biliary dilatation with CBD up to 1.2 cm, much of this may be a physiologic response to cholecystectomy. 9. Sigmoid colon diverticulosis. These results will be called to the ordering clinician or representative by the Radiologist Assistant, and communication documented in the PACS or zVision Dashboard.  Pelvic Ultrasound transvaginal/transabdominal COMPARISON: CT Abdomen and Pelvis 08/27/2016. FINDINGS: Uterus: Measurements: 5.2 x 2.8 x 4.1 cm. No fibroids or other mass visualized. Endometrium: Thickness: 15 mm (image 59). Severely heterogeneous (image 41) with some vascular internal architecture ( image 54). Right ovary: Measurements: No definite right ovarian parenchyma identified. However, on transabdominal images there is a 3.3 cm cystic, loculated appearing area near the iliac vessels (images 26 through 31). Although there appear to be internal septations there is no internal vascularity  evident on Doppler. Left ovary: Measurements: Not visualized. Other findings No abnormal free fluid.  IMPRESSION: 1. Confirmed Abnormally thickened (15 mm) and heterogeneous endometrium, suspicious for carcinoma. GYN surgery consultation and Endometrial Sampling recommended. 2. Left  ovary not visualized. 3.3 cm area of mildly loculated but otherwise simple appearing cystic change in the region of the right ovary. 3. No pelvic free fluid. Normal appearing uterine myometrium. These results will be called to the ordering clinician or representative by the Radiologist Assistant, and communication documented in the PACS or zVision Dashboard.   No LMP recorded. Patient is postmenopausal.   Problem List Date Reviewed: 24-Dec-2016  Codes Priority Class Noted - Resolved  Paroxysmal A-fib (CMS-HCC) ICD-10-CM: I48.0 ICD-9-CM: 427.31 11/19/2016 - Present  Increased endometrial stripe thickness ICD-10-CM: R93.8 ICD-9-CM: 793.5 Unknown - Present  Thickened endometrium ICD-10-CM: R93.8 ICD-9-CM: 793.5 10/06/2016 - Present  Endometrial thickening on ultra sound ICD-10-CM: R93.8 ICD-9-CM: 793.5 09/09/2016 - Present  Overview Signed 09/09/2016 3:29 PM by Glendon Axe, MD  Referred to GYN and oncology   Positive fecal occult blood test ICD-10-CM: R19.5 ICD-9-CM: 792.1 11/20/2015 - Present  Gastroesophageal reflux disease without esophagitis ICD-10-CM: K21.9 ICD-9-CM: 530.81 11/20/2015 - Present  Pure hypercholesterolemia ICD-10-CM: E78.00 ICD-9-CM: 272.0 11/20/2015 - Present  Essential hypertension ICD-10-CM: I10 ICD-9-CM: 401.9 11/20/2015 - Present  Primary osteoarthritis involving multiple joints ICD-10-CM: M15.0 ICD-9-CM: 715.09 11/20/2015 - Present     Past Medical History: has a past medical history of Arthritis; Cataracts, bilateral; Enlarged heart; GERD (gastroesophageal reflux disease); Hernia of abdominal cavity; Hyperlipidemia, unspecified; Hypertension; and Tubular adenoma of colon,  unspecified (01/16/2016).  Past Surgical History: has a past surgical history that includes Cholecystectomy; LUMPECTOMY CHEST AREA; Cataract extraction (Bilateral); Joint replacement (Left, 03/24/2011); Joint replacement (Right, 04/03/2012); Hernia repair (04/2012); and Colonoscopy (01/16/2016). Family History: family history includes Diabetes in her brother, mother, and sister; Heart disease in her mother; Lung cancer in her father; Stroke in her mother. Social History: reports that she has never smoked. She has never used smokeless tobacco. She reports that she does not drink alcohol or use drugs. OB/GYN History:  OB History  No data available    Allergies: has No Known Allergies. Medications:  Current Outpatient Prescriptions:  . apixaban (ELIQUIS) 5 mg tablet, Take 1 tablet (5 mg total) by mouth 2 (two) times daily., Disp: 60 tablet, Rfl: 5 . ascorbic acid (VITAMIN C) 1000 MG tablet, Take 1,000 mg by mouth once daily., Disp: , Rfl:  . aspirin 81 MG EC tablet, Take 81 mg by mouth once daily., Disp: , Rfl:  . biotin 1 mg tablet, Take 1,000 mcg by mouth once daily., Disp: , Rfl:  . cholecalciferol (CHOLECALCIFEROL) 1,000 unit tablet, Take 1,000 Units by mouth once daily., Disp: , Rfl:  . CRANBERRY FRUIT EXTRACT (CRANBERRY CONCENTRATE ORAL), Take 4,200 mg by mouth once daily., Disp: , Rfl:  . fish,bora,flax oils-om3,6,9 #1 (OMEGA 3-6-9) 1,200 mg Cap, Take 1 capsule by mouth once daily., Disp: , Rfl:  . FUROsemide (LASIX) 20 MG tablet, Take 1 tablet (20 mg total) by mouth once daily., Disp: 90 tablet, Rfl: 3 . lisinopril-hydrochlorothiazide (PRINZIDE,ZESTORETIC) 20-25 mg tablet, Take 1 tablet by mouth once daily., Disp: 90 tablet, Rfl: 3 . ranitidine (ZANTAC) 150 MG tablet, Take 1 tablet (150 mg total) by mouth once daily., Disp: 90 tablet, Rfl: 3 . simvastatin (ZOCOR) 40 MG tablet, Take 1 tablet (40 mg total) by mouth nightly., Disp: 90 tablet, Rfl: 3 . traMADol (ULTRAM) 50 mg tablet, Take 1  tablet (50 mg total) by mouth 3 (three) times daily as needed for Pain., Disp: 60 tablet, Rfl: 2  Review of Systems: General: No fatigue or weight loss Eyes: No vision changes Ears: No hearing difficulty Respiratory: No  cough or shortness of breath Pulmonary: No asthma or shortness of breath Cardiovascular: No chest pain, palpitations, dyspnea on exertion Gastrointestinal: No abdominal bloating, chronic diarrhea, constipations, masses, pain or hematochezia Genitourinary: No hematuria, dysuria, abnormal vaginal discharge, pelvic pain, Menometrorrhagia Lymphatic: No swollen lymph nodes Musculoskeletal: No muscle weakness Neurologic: No extremity weakness, syncope, seizure disorder Psychiatric: No history of depression, delusions or suicidal/homicidal ideation  Exam:   BP 134/66  Pulse 57  Ht 157.5 cm (5\' 2" )  Wt 59.9 kg (132 lb)  BMI 24.14 kg/m   Body mass index is 24.14 kg/m.  WDWN white female in NAD  Lungs: Normal respiratory effort CV: RRR  Impression:   The primary encounter diagnosis was Uterine mass. A diagnosis of Preoperative exam for gynecologic surgery was also pertinent to this visit.  Plan:   - Recommend D&C with hysteroscopy and likely use of myosure for removal of uterine mass. - consents signed today with hopes to complete this before the end of the month. - Jehovah's witness, copied permission products and no blood contract. She is willing to accept albumin and other hemodilution products but not blood, platelets, FFP or cryo.  I personally performed the service. (TP)  Kenyon Eshleman CRIST Jyra Lagares, MD

## 2017-01-14 NOTE — OR Nursing (Signed)
Dr Marcello Moores notified of 5.5 K+ this am.

## 2017-01-14 NOTE — Anesthesia Preprocedure Evaluation (Addendum)
Anesthesia Evaluation  Patient identified by MRN, date of birth, ID band Patient awake    Reviewed: Allergy & Precautions, NPO status , Patient's Chart, lab work & pertinent test results, reviewed documented beta blocker date and time   History of Anesthesia Complications (+) PONV and history of anesthetic complications  Airway Mallampati: II  TM Distance: >3 FB     Dental  (+) Chipped, Poor Dentition, Missing   Pulmonary           Cardiovascular hypertension, Pt. on medications +CHF  + dysrhythmias Atrial Fibrillation      Neuro/Psych    GI/Hepatic GERD  Controlled,  Endo/Other    Renal/GU      Musculoskeletal  (+) Arthritis ,   Abdominal   Peds  Hematology   Anesthesia Other Findings   Reproductive/Obstetrics                            Anesthesia Physical Anesthesia Plan  ASA: II  Anesthesia Plan: General   Post-op Pain Management:    Induction: Intravenous  Airway Management Planned: LMA  Additional Equipment:   Intra-op Plan:   Post-operative Plan:   Informed Consent: I have reviewed the patients History and Physical, chart, labs and discussed the procedure including the risks, benefits and alternatives for the proposed anesthesia with the patient or authorized representative who has indicated his/her understanding and acceptance.     Plan Discussed with: CRNA  Anesthesia Plan Comments:         Anesthesia Quick Evaluation

## 2017-01-14 NOTE — Anesthesia Post-op Follow-up Note (Cosign Needed)
Anesthesia QCDR form completed.        

## 2017-01-15 ENCOUNTER — Encounter: Payer: Self-pay | Admitting: Obstetrics & Gynecology

## 2017-01-17 LAB — SURGICAL PATHOLOGY

## 2017-02-28 ENCOUNTER — Ambulatory Visit (INDEPENDENT_AMBULATORY_CARE_PROVIDER_SITE_OTHER): Payer: Medicare HMO | Admitting: Podiatry

## 2017-02-28 ENCOUNTER — Encounter: Payer: Self-pay | Admitting: Podiatry

## 2017-02-28 DIAGNOSIS — Q828 Other specified congenital malformations of skin: Secondary | ICD-10-CM

## 2017-02-28 NOTE — Progress Notes (Signed)
This patient presents the office with chief complaint of a painful callus under the outside ball of both feet. She says the calluses have become very painful  walking and wearing her shoes.  She says she has applied medicine in an effort to help to relieve the pain. She says it is mildly helped but she has come to the office today for continued evaluation. Examination of her previous visits reveal that she had the same problem in 2016 as she has today.  She presents the office today for an evaluation and treatment of her painful calluses both feet  GENERAL APPEARANCE: Alert, conversant. Appropriately groomed. No acute distress.  VASCULAR: Pedal pulses are  palpable at  [facility] and PT bilateral.  Capillary refill time is immediate to all digits,  Normal temperature gradient.  Digital hair growth is present bilateral  NEUROLOGIC: sensation is normal to 5.07 monofilament at 5/5 sites bilateral.  Light touch is intact bilateral, Muscle strength normal.  MUSCULOSKELETAL: acceptable muscle strength, tone and stability bilateral.  Intrinsic muscluature intact bilateral.  Plantarflexed fifth metatarsal both feet.  DERMATOLOGIC: skin color, texture, and turgor are within normal limits.  No preulcerative lesions or ulcers  are seen, no interdigital maceration noted.  No open lesions present.  Digital nails are asymptomatic. No drainage noted. White necrotic tissue under 5th metatarsal right foot.   Painful callus sub 5th met left foot.  Chemical burn sub 5th metatarsal right foot.  Plantarflexed 5th met B/L.  ROV  Debridement of necrotic tissue right foot.  Debridement of callus left foot.  Patient was applied to the fifth metatarsal right foot. Discussed the possibility of surgical intervention if this problem persists. Return to clinic when necessary   Gardiner Barefoot DPM .

## 2017-03-28 ENCOUNTER — Ambulatory Visit (INDEPENDENT_AMBULATORY_CARE_PROVIDER_SITE_OTHER): Payer: Medicare HMO | Admitting: Family Medicine

## 2017-03-28 ENCOUNTER — Encounter: Payer: Self-pay | Admitting: Family Medicine

## 2017-03-28 VITALS — BP 94/59 | HR 52 | Temp 98.6°F | Resp 16 | Ht 58.75 in | Wt 139.1 lb

## 2017-03-28 DIAGNOSIS — I4891 Unspecified atrial fibrillation: Secondary | ICD-10-CM | POA: Diagnosis not present

## 2017-03-28 DIAGNOSIS — F0390 Unspecified dementia without behavioral disturbance: Secondary | ICD-10-CM | POA: Diagnosis not present

## 2017-03-28 DIAGNOSIS — Z0000 Encounter for general adult medical examination without abnormal findings: Secondary | ICD-10-CM

## 2017-03-28 DIAGNOSIS — I10 Essential (primary) hypertension: Secondary | ICD-10-CM | POA: Diagnosis not present

## 2017-03-28 DIAGNOSIS — E785 Hyperlipidemia, unspecified: Secondary | ICD-10-CM | POA: Diagnosis not present

## 2017-03-28 NOTE — Assessment & Plan Note (Signed)
BP low today. Continue lisinopril/HCTZ. Stopping Lasix.

## 2017-03-28 NOTE — Assessment & Plan Note (Signed)
Stable. Continue Simvastatin.

## 2017-03-28 NOTE — Assessment & Plan Note (Signed)
Discussed tetanus immunization, DEXA scan, and pneumococcal vaccine. Patient wants to wait.

## 2017-03-28 NOTE — Assessment & Plan Note (Signed)
Appears stable. Continue Aricept.

## 2017-03-28 NOTE — Assessment & Plan Note (Signed)
Stable on Eliquis. Continue. No mention of congestive heart failure and no sore cardiology. EF 50-55%. Given urinary frequency, and low BP today stopping Lasix.

## 2017-03-28 NOTE — Progress Notes (Signed)
Subjective:  Patient ID: [name], female    DOB: 27-Aug-1941  Age: 76 y.o. MRN: [id number]  CC: Establish care  HPI Mikaiya SHETARA LAUNER is a 76 y.o. female presents to the clinic today to establish care.  Afib  Sees cardiology.  Is currently on Eliquis.  She's also on Lasix. Cardiology notes do not reflect a history of congestive heart failure.  HTN  BP low today. She is currently on lisinopril/HCTZ and Lasix.  Will discuss today.  HLD  Last LDL was 76. Currently on Simvastatin.   Dementia  Reports memory difficulty.  She seems to be doing well on Aricept at this time.  PMH, Surgical Hx, Family Hx, Social History reviewed and updated as below.  Past Medical History:  Diagnosis Date  . GERD (gastroesophageal reflux disease)   . Hyperlipidemia   . Hypertension   . Osteoarthritis   . Pre-diabetes   . Scoliosis deformity of spine   . Valvular heart disease    Echocardiogram 2014- moderate mitral regurg, moderate tricuspid regurg, moderate dilated left atrium and right atrium, EF 50-55%   Past Surgical History:  Procedure Laterality Date  . CATARACT EXTRACTION W/ INTRAOCULAR LENS  IMPLANT, BILATERAL    . CHOLECYSTECTOMY    . COLONOSCOPY WITH PROPOFOL N/A 01/16/2016   Procedure: COLONOSCOPY WITH PROPOFOL;  Surgeon: [name], MD;  [facility];  Service: Endoscopy;  Laterality: N/A;  . HERNIA REPAIR     with Bowel Involvement (04/2012)  . HYSTEROSCOPY W/D&C N/A 01/14/2017   Procedure: DILATATION AND CURETTAGE /HYSTEROSCOPY;  Surgeon: Honor Loh Ward, MD;  Location: [facility] ORS;  Service: Gynecology;  Laterality: N/A;  . 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 Lumpectomy Chest Area    . TOTAL KNEE ARTHROPLASTY Bilateral    Family History  Problem Relation Age of Onset  . CAD Mother   . Diabetes Mellitus II Mother   . Stroke Mother   . Lung cancer Father   . Diabetes Mellitus II Sister   . Diabetes  Mellitus II Brother   . Breast cancer Neg Hx    Social History  Substance Use Topics  . Smoking status: Never Smoker  . Smokeless tobacco: Never Used  . Alcohol use No    Review of Systems  Eyes: Positive for visual disturbance.  Cardiovascular: Positive for palpitations and leg swelling.  Genitourinary: Positive for frequency.       Urinary incontinence.  Neurological: Positive for dizziness and numbness.  Psychiatric/Behavioral:       Stress, memory difficulty.   All other systems reviewed and are negative.   Objective:   Today's Vitals: BP (!) 94/59 (BP Location: Left Arm, Patient Position: Sitting, Cuff Size: Normal)   Pulse (!) 52   Temp 98.6 F (37 C) (Oral)   Resp 16   Ht 4' 10.75" (1.492 m)   Wt 139 lb 2 oz (63.1 kg)   SpO2 98%   BMI 28.34 kg/m   Physical Exam  Constitutional:  Frail elderly female in no acute distress.  HENT:  Head: Normocephalic and atraumatic.  Eyes: Conjunctivae are normal. No scleral icterus.  Neck: Neck supple. No thyromegaly present.  Cardiovascular:  Irregularly irregular. 7-2/0 systolic murmur.  Pulmonary/Chest: Effort normal. She has no wheezes. She has no rales.  Abdominal: Soft. She exhibits no distension. There is no tenderness. There is no rebound and no guarding.  Lymphadenopathy:    She has no cervical adenopathy.  Neurological: She is alert.  No apparent focal neurological deficits.  Skin: Skin is warm. No rash noted.  Psychiatric: She has a normal mood and affect.  Vitals reviewed.  Assessment & Plan:   Problem List Items Addressed This Visit      Cardiovascular and Mediastinum   Atrial fibrillation ([place]) - Primary    Stable on Eliquis. Continue. No mention of congestive heart failure and no sore cardiology. EF 50-55%. Given urinary frequency, and low BP today stopping Lasix.      Relevant Medications   simvastatin (ZOCOR) 40 MG tablet   Essential hypertension    BP low today. Continue lisinopril/HCTZ.  Stopping Lasix.      Relevant Medications   simvastatin (ZOCOR) 40 MG tablet     Nervous and Auditory   Dementia    Appears stable. Continue Aricept.      Relevant Medications   donepezil (ARICEPT) 5 MG tablet     Other   Hyperlipidemia    Stable. Continue Simvastatin.       Relevant Medications   simvastatin (ZOCOR) 40 MG tablet   Preventative health care    Discussed tetanus immunization, DEXA scan, and pneumococcal vaccine. Patient wants to wait.         Follow-up: 3 months  [place]

## 2017-03-28 NOTE — Patient Instructions (Addendum)
You can stop the compression hose.  Stop the Furosemide. Use only as needed (SOB, weight gain, edema)  Follow up in 3 months.  Take care  Dr. Lacinda Axon

## 2017-03-29 ENCOUNTER — Other Ambulatory Visit: Payer: Self-pay | Admitting: Family Medicine

## 2017-03-29 NOTE — Telephone Encounter (Signed)
Pt called about needing all of her medications of   apixaban (ELIQUIS) 5 MG TABS tablet    Cholecalciferol (VITAMIN D3) 2000 units TABS    donepezil (ARICEPT) 5 MG tablet    lisinopril-hydrochlorothiazide (PRINZIDE,ZESTORETIC) 20-25 MG per tablet    Probiotic Product (PROBIOTIC-10) CHEW    ranitidine (ZANTAC) 150 MG tablet    simvastatin (ZOCOR) 40 MG tablet    Mark as Reviewed   Pt would like all of those medications to be on a 90 day supply if possible. Please call pt with information regarding [employer] 90 day supply medication.   Pharmacy is [facility] - [place] ([address]), [place] - [address]  Thank you!

## 2017-03-31 MED ORDER — APIXABAN 5 MG PO TABS: 5.0000 mg | ORAL_TABLET | Freq: Two times a day (BID) | ORAL | 1 refills | Status: DC

## 2017-03-31 MED ORDER — SIMVASTATIN 40 MG PO TABS
40.0000 mg | ORAL_TABLET | Freq: Every day | ORAL | 1 refills | Status: DC
Start: 2017-03-31 — End: 2017-04-07

## 2017-03-31 MED ORDER — VITAMIN D3 50 MCG (2000 UT) PO TABS: 2000.0000 [IU] | ORAL_TABLET | Freq: Every day | ORAL | 1 refills | Status: DC

## 2017-03-31 MED ORDER — RANITIDINE HCL 150 MG PO TABS
150.0000 mg | ORAL_TABLET | Freq: Two times a day (BID) | ORAL | 1 refills | Status: DC
Start: 2017-03-31 — End: 2018-08-16

## 2017-03-31 MED ORDER — LISINOPRIL-HYDROCHLOROTHIAZIDE 20-25 MG PO TABS: 1.0000 | ORAL_TABLET | Freq: Every day | ORAL | 1 refills | Status: DC

## 2017-03-31 MED ORDER — PROBIOTIC-10 PO CHEW: 1.0000 | CHEWABLE_TABLET | Freq: Two times a day (BID) | ORAL | 1 refills | Status: DC

## 2017-03-31 MED ORDER — DONEPEZIL HCL 5 MG PO TABS: 5.0000 mg | ORAL_TABLET | Freq: Every day | ORAL | 1 refills | Status: DC

## 2017-03-31 NOTE — Telephone Encounter (Signed)
Last office 03/28/17 Next office visit 06/30/17 We have never prescribed Ok to fill ?

## 2017-03-31 NOTE — Telephone Encounter (Signed)
Dosing of aricept is incorrect. Please call patient to inquire.

## 2017-03-31 NOTE — Addendum Note (Signed)
Addended by: Johna Sheriff on: 03/31/2017 03:57 PM   Modules accepted: Orders

## 2017-04-01 NOTE — Telephone Encounter (Signed)
Called Pharmacy and verified dosage of Aricept

## 2017-04-06 ENCOUNTER — Telehealth: Payer: Self-pay | Admitting: *Deleted

## 2017-04-06 NOTE — Telephone Encounter (Signed)
Pt is now using [facility] for her 68 day supply medication , she wishes to have all future 90 day supply medications sent to this pharmacy

## 2017-04-07 ENCOUNTER — Other Ambulatory Visit: Payer: Self-pay | Admitting: Family Medicine

## 2017-04-07 MED ORDER — SIMVASTATIN 40 MG PO TABS: 40.0000 mg | ORAL_TABLET | Freq: Every day | ORAL | 1 refills | Status: DC

## 2017-04-07 MED ORDER — SIMVASTATIN 40 MG PO TABS: 40.0000 mg | ORAL_TABLET | Freq: Every day | ORAL | 0 refills | Status: DC

## 2017-04-07 NOTE — Telephone Encounter (Signed)
Pt called and stated that the pharmacy does not have her simvastatin (ZOCOR) 40 MG tablet. Pt would like [place] to call in a 30 day supply to [place] ([place]), [place] and then a 90 day supply to [place], [place]. Please advise, thank you!  Call pt @ [phone]

## 2017-04-07 NOTE — Telephone Encounter (Signed)
It is noted on the chart, thanks

## 2017-04-07 NOTE — Telephone Encounter (Signed)
Sent in both requests, thanks

## 2017-04-11 ENCOUNTER — Telehealth: Payer: Self-pay | Admitting: Family Medicine

## 2017-04-11 NOTE — Telephone Encounter (Signed)
Pt called and is requesting a new rx for Tramadol. Pt states that she takes it for back pain, takes only when needed. Please advise, thank you!  [place] ([place]), [place]  Call pt @ [phone]

## 2017-04-11 NOTE — Telephone Encounter (Signed)
Last OV with PCP 03/28/17 pease advise ok to fill tramadol?

## 2017-04-12 ENCOUNTER — Other Ambulatory Visit: Payer: Self-pay | Admitting: Family Medicine

## 2017-04-12 MED ORDER — TRAMADOL HCL 50 MG PO TABS: 50.0000 mg | ORAL_TABLET | Freq: Three times a day (TID) | ORAL | 1 refills | Status: DC | PRN

## 2017-04-12 NOTE — Telephone Encounter (Signed)
Rx to be faxed

## 2017-04-12 NOTE — Telephone Encounter (Signed)
Script faxed.

## 2017-04-13 ENCOUNTER — Other Ambulatory Visit: Payer: Self-pay

## 2017-04-13 MED ORDER — SIMVASTATIN 40 MG PO TABS: 40.0000 mg | ORAL_TABLET | Freq: Every day | ORAL | 1 refills | Status: DC

## 2017-04-13 NOTE — Telephone Encounter (Signed)
Per [facility], they do not dispense Simvastatin medication. Reordered medication to [facility].

## 2017-04-14 ENCOUNTER — Telehealth: Payer: Self-pay | Admitting: Family Medicine

## 2017-04-14 NOTE — Telephone Encounter (Signed)
Pt called and wants to know if apixaban (ELIQUIS) 5 MG TABS tablet would cause her to lose balance and itch. Please advis,e thank you!  Call pt @ [phone]

## 2017-04-15 NOTE — Telephone Encounter (Signed)
Patient advised of below and verbalized an understanding  

## 2017-04-15 NOTE — Telephone Encounter (Signed)
Not that I am aware of.

## 2017-04-20 ENCOUNTER — Telehealth: Payer: Self-pay | Admitting: Family Medicine

## 2017-04-20 NOTE — Telephone Encounter (Signed)
Pt called and cancelled her upcoming appt with Dr. Lacinda Axon due to the uneven pavement getting into the building. Pt is going to be switching providers due to this.

## 2017-04-21 DIAGNOSIS — R600 Localized edema: Secondary | ICD-10-CM | POA: Insufficient documentation

## 2017-05-27 ENCOUNTER — Encounter: Payer: Self-pay | Admitting: Urology

## 2017-05-27 ENCOUNTER — Ambulatory Visit (INDEPENDENT_AMBULATORY_CARE_PROVIDER_SITE_OTHER): Payer: Medicare HMO | Admitting: Urology

## 2017-05-27 VITALS — BP 115/63 | HR 66 | Ht 58.75 in | Wt 140.0 lb

## 2017-05-27 DIAGNOSIS — N200 Calculus of kidney: Secondary | ICD-10-CM | POA: Diagnosis not present

## 2017-05-27 DIAGNOSIS — Z7901 Long term (current) use of anticoagulants: Secondary | ICD-10-CM

## 2017-05-27 DIAGNOSIS — R310 Gross hematuria: Secondary | ICD-10-CM

## 2017-05-27 DIAGNOSIS — R109 Unspecified abdominal pain: Secondary | ICD-10-CM | POA: Diagnosis not present

## 2017-05-27 LAB — URINALYSIS, COMPLETE
BILIRUBIN UA: NEGATIVE
GLUCOSE, UA: NEGATIVE
KETONES UA: NEGATIVE
NITRITE UA: NEGATIVE
SPEC GRAV UA: 1.015 (ref 1.005–1.030)
Urobilinogen, Ur: 0.2 mg/dL (ref 0.2–1.0)
pH, UA: 7 (ref 5.0–7.5)

## 2017-05-27 LAB — MICROSCOPIC EXAMINATION: RBC, UA: >30 /hpf — ABNORMAL HIGH (ref 0–?)

## 2017-05-27 NOTE — Progress Notes (Signed)
05/27/2017 11:17 AM   Whitnie Dante Gang [birth year] [id number]  Referring provider: Coral Spikes, DO [address] [place], [place] [address]  Chief Complaint  Patient presents with  . Hematuria    HPI: 76 year old female with severe scoliosis, dementia, CHF who presents today for further evaluation of gross hematuria.  She reports that about two weeks ago ago, she developed painless gross hematuria.  UA on 05/16/2017 shows greater than 30 white blood cells per high-powered field, greater than 30 red blood cells per high-powered field, moderate bacteria. She is placed on Cipro for treatment of presumed UTI. We have no culture data available today.  She is on chronic anticoagulation, Elliquis for A. fib. Since she's had hematuria, this is been held.  This medication was started approximately one year ago.  UA today looks slightly improved but still somewhat suspicious for ongoing infection.  It is grossly bloody today.  She denies any dysuria or change from her baseline urinary symptoms. She does have baseline urgency, frequency, and urge incontinence which is unchanged and stable.  Review of her medical record show that she underwent a CT scan ordered by her previous PCP on 08/27/2016 for further evaluation of chronic bladder infections and microscopic hematuria.  This showed a large right sided staghorn calculus measuring 3.7 x 1.3 x 1.1 in the upper pole and 3.1 x 1.4 x 1.5 in the lower pole.  She did have a fullness of her collecting system associated with a staghorn.  She has not referred to urology at the time. She also had incidental finding of abnormal endometrial thickening in this problem seemed to take precedence. She since undergone further evaluation and treatment with Dr. Larey Days.  Additionally, review of records reveal evidence of chronic asymptomatic bacteriuria.  Today, she complains of left flank pain from the paraspinous region radiating all the way down  to her buttock. This is been going on for 2 weeks. She reports that she believes this is related to her left kidney.  She does have baseline severe scoliosis. Additionally, she is a Restaurant manager, fast food.   PMH: Past Medical History:  Diagnosis Date  . GERD (gastroesophageal reflux disease)   . Hyperlipidemia   . Hypertension   . Osteoarthritis   . Pre-diabetes   . Scoliosis deformity of spine   . Valvular heart disease    Echocardiogram 2014- moderate mitral regurg, moderate tricuspid regurg, moderate dilated left atrium and right atrium, EF 50-55%    Surgical History: Past Surgical History:  Procedure Laterality Date  . CATARACT EXTRACTION W/ INTRAOCULAR LENS  IMPLANT, BILATERAL    . CHOLECYSTECTOMY    . COLONOSCOPY WITH PROPOFOL N/A 01/16/2016   Procedure: COLONOSCOPY WITH PROPOFOL;  Surgeon: [name], MD;  Location: [facility] ENDOSCOPY;  Service: Endoscopy;  Laterality: N/A;  . HERNIA REPAIR     with Bowel Involvement (04/2012)  . HYSTEROSCOPY W/D&C N/A 01/14/2017   Procedure: DILATATION AND CURETTAGE /HYSTEROSCOPY;  Surgeon: Honor Loh Ward, MD;  Location: [facility] ORS;  Service: Gynecology;  Laterality: N/A;  . 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 Lumpectomy Chest Area    . TOTAL KNEE ARTHROPLASTY Bilateral     Home Medications:  Allergies as of 05/27/2017      Reactions   Other       Medication List       Accurate as of 05/27/17 11:17 AM. Always use your most recent med list.  apixaban 5 MG Tabs tablet Commonly known as:  ELIQUIS Take 1 tablet (5 mg total) by mouth 2 (two) times daily.   donepezil 5 MG tablet Commonly known as:  ARICEPT Take 1 tablet (5 mg total) by mouth at bedtime.   lisinopril-hydrochlorothiazide 20-25 MG tablet Commonly known as:  PRINZIDE,ZESTORETIC Take 1 tablet by mouth daily.   PROBIOTIC-10 Chew Chew 1 tablet by mouth 2 (two) times daily.   ranitidine 150 MG tablet Commonly known  as:  ZANTAC Take 1 tablet (150 mg total) by mouth 2 (two) times daily.   simvastatin 40 MG tablet Commonly known as:  ZOCOR Take 1 tablet (40 mg total) by mouth daily.   traMADol 50 MG tablet Commonly known as:  ULTRAM Take 1 tablet (50 mg total) by mouth every 8 (eight) hours as needed.   Vitamin D3 2000 units Tabs Take 2,000 Units by mouth daily.            Discharge Care Instructions        Start     Ordered   05/27/17 0000  Urinalysis, Complete     05/27/17 0953   05/27/17 0000  CULTURE, URINE COMPREHENSIVE     05/27/17 1048   05/27/17 0000  CT HEMATURIA WORKUP    Question Answer Comment  Reason for Exam (SYMPTOM  OR DIAGNOSIS REQUIRED) gross hematuria, recurent UTI, right staghorn calculus   Preferred imaging location? [facility]      05/27/17 1116      Allergies:  Allergies  Allergen Reactions  . Other     Family History: Family History  Problem Relation Age of Onset  . CAD Mother   . Diabetes Mellitus II Mother   . Stroke Mother   . Lung cancer Father   . Diabetes Mellitus II Sister   . Diabetes Mellitus II Brother   . Breast cancer Neg Hx   . Bladder Cancer Neg Hx   . Kidney cancer Neg Hx     Social History:  reports that she has never smoked. She has never used smokeless tobacco. She reports that she does not drink alcohol or use drugs.  ROS: UROLOGY Frequent Urination?: Yes Hard to postpone urination?: Yes Burning/pain with urination?: No Get up at night to urinate?: Yes Leakage of urine?: Yes Urine stream starts and stops?: No Trouble starting stream?: No Do you have to strain to urinate?: No Blood in urine?: Yes Urinary tract infection?: No Sexually transmitted disease?: No Injury to kidneys or bladder?: No Painful intercourse?: No Weak stream?: No Currently pregnant?: No Vaginal bleeding?: No Last menstrual period?: n  Gastrointestinal Nausea?: No Vomiting?: No Indigestion/heartburn?: No Diarrhea?:  No Constipation?: No  Constitutional Fever: No Night sweats?: No Weight loss?: No Fatigue?: Yes  Skin Skin rash/lesions?: No Itching?: Yes  Eyes Blurred vision?: No Double vision?: No  Ears/Nose/Throat Sore throat?: No Sinus problems?: No  Hematologic/Lymphatic Swollen glands?: No Easy bruising?: Yes  Cardiovascular Leg swelling?: No Chest pain?: No  Respiratory Cough?: No Shortness of breath?: No  Endocrine Excessive thirst?: No  Musculoskeletal Back pain?: Yes Joint pain?: Yes  Neurological Headaches?: No Dizziness?: Yes  Psychologic Depression?: Yes Anxiety?: Yes  Physical Exam: BP 115/63 (BP Location: Right Arm, Patient Position: Sitting, Cuff Size: Normal)   Pulse 66   Ht 4' 10.75" (1.492 m)   Wt 140 lb (63.5 kg)   BMI 28.52 kg/m   Constitutional:  Alert and oriented, No acute distress.  Frail.  Accompanied by sister status a phasic.  HEENT: [place] AT, moist mucus membranes.  Trachea midline, no masses. Cardiovascular: No clubbing, cyanosis, or edema. Respiratory: Normal respiratory effort, no increased work of breathing. GI: Abdomen is soft, nontender, nondistended, no abdominal masses MSK: Severe scoliosis. Ambulating with walker.  Left paraspinous tenderness extending to gluteus. Skin: No rashes, bruises or suspicious lesions. Neurologic: Grossly intact, no focal deficits, moving all 4 extremities. Psychiatric: Normal mood and affect.  Laboratory Data: Lab Results  Component Value Date   WBC 4.0 01/07/2017   HGB 13.3 01/14/2017   HCT 39.0 01/14/2017   MCV 89.3 01/07/2017   PLT 206 01/07/2017    Lab Results  Component Value Date   CREATININE 0.80 01/07/2017    Urinalysis N/a  Pertinent Imaging: Previous CT abd/ pelvis from 08/2016 reviewed- large right staghorn as above  Assessment & Plan:    1. Gross hematuria Likely secondary to large staghorn in the setting of anticoagulation  Cumulatively quite some time since her last  imaging, we'll obtain CT urogram to assess for any interval progression/obstruction of the large stone as well as rule out any other etiologies.  Cystoscopy to evaluate bladder recommended  UCx to r/o infection  - Urinalysis, Complete - CULTURE, URINE COMPREHENSIVE - CT HEMATURIA WORKUP; Future  2. Left flank pain Likely secondary to musculoskeletal pain from severe scoliosis- no left sided renal pathology on previous scans but will reevalatue  3. Staghorn calculus Large right chronic staghorn, asymptomatic  Very poor surgical candidate given her severe scoliosis making access for PCNL difficult, additionally she is a Jehovah's Witness increasing risk surgery Consider referral to tertiary care center after hematuria workup is completed  4. Chronic anticoagulation Recommend holding anticoagulation to workup completed, patient is agreeable with this plan   Return for cytoscopy, CT urogram.  Hollice Espy, MD  [facility] [address], [place], [place] [address] [phone]  I spent 45 min with this patient of which greater than 50% was spent in counseling and coordination of care with the patient.

## 2017-06-02 LAB — CULTURE, URINE COMPREHENSIVE

## 2017-06-06 ENCOUNTER — Ambulatory Visit
Admission: RE | Admit: 2017-06-06 | Discharge: 2017-06-06 | Disposition: A | Discharge status: Home / Self Care | Payer: Medicare HMO | Source: Ambulatory Visit | Attending: Urology | Admitting: Urology

## 2017-06-06 DIAGNOSIS — R310 Gross hematuria: Secondary | ICD-10-CM | POA: Diagnosis present

## 2017-06-06 DIAGNOSIS — N200 Calculus of kidney: Secondary | ICD-10-CM | POA: Diagnosis not present

## 2017-06-06 DIAGNOSIS — K449 Diaphragmatic hernia without obstruction or gangrene: Secondary | ICD-10-CM | POA: Insufficient documentation

## 2017-06-06 DIAGNOSIS — I700 Atherosclerosis of aorta: Secondary | ICD-10-CM | POA: Diagnosis not present

## 2017-06-06 LAB — POCT I-STAT CREATININE: CREATININE: 1.1 mg/dL — AB (ref 0.44–1.00)

## 2017-06-06 MED ORDER — IOPAMIDOL (ISOVUE-300) INJECTION 61%
125.0000 mL | Freq: Once | INTRAVENOUS | Status: AC | PRN
  Administered 2017-06-06: 125 mL via INTRAVENOUS

## 2017-06-14 ENCOUNTER — Ambulatory Visit: Payer: Medicare HMO | Admitting: Urology

## 2017-06-14 ENCOUNTER — Encounter: Payer: Self-pay | Admitting: Urology

## 2017-06-14 VITALS — BP 120/87 | HR 44 | Ht 62.0 in | Wt 140.0 lb

## 2017-06-14 DIAGNOSIS — R310 Gross hematuria: Secondary | ICD-10-CM

## 2017-06-14 DIAGNOSIS — N200 Calculus of kidney: Secondary | ICD-10-CM

## 2017-06-14 LAB — MICROSCOPIC EXAMINATION
RBC, UA: >30 /hpf — ABNORMAL HIGH (ref 0–?)
RENAL EPITHEL UA: NONE SEEN /HPF

## 2017-06-14 LAB — URINALYSIS, COMPLETE
BILIRUBIN UA: NEGATIVE
GLUCOSE, UA: NEGATIVE
KETONES UA: NEGATIVE
NITRITE UA: NEGATIVE
SPEC GRAV UA: 1.01 (ref 1.005–1.030)
Urobilinogen, Ur: 0.2 mg/dL (ref 0.2–1.0)
pH, UA: 7 (ref 5.0–7.5)

## 2017-06-14 MED ORDER — CIPROFLOXACIN HCL 500 MG PO TABS
500.0000 mg | ORAL_TABLET | Freq: Once | ORAL | Status: AC
  Administered 2017-06-14: 500 mg via ORAL

## 2017-06-14 MED ORDER — LIDOCAINE HCL 2 % EX GEL
1.0000 "application " | Freq: Once | CUTANEOUS | Status: AC
  Administered 2017-06-14: 1 via URETHRAL

## 2017-06-14 NOTE — Progress Notes (Signed)
   06/14/17  CC:  Chief Complaint  Patient presents with  . Cysto    HPI: 76 year old with severe scoliosis, dementia, CHF with episode of gross hematuria likely related to large right staghorn calculus first seen on CT scan in 08/2016 exacerbated by anticoagulation. She returns today for cystoscopy to rule out any underlying bladder pathology.  She does have chronic asymptomatic bacteriuria.  No further further episodes of gross hematuria.  She is a Restaurant manager, fast food.  Blood pressure 120/87, pulse (!) 44, height 5\' 2"  (1.575 m), weight 140 lb (63.5 kg). NED. A&Ox3.   No respiratory distress   Abd soft, NT, ND Normal external genitalia with patent urethral meatus  Cystoscopy Procedure Note  Patient identification was confirmed, informed consent was obtained, and patient was prepped using Betadine solution.  Lidocaine jelly was administered per urethral meatus.    Preoperative abx where received prior to procedure.    Procedure: - Flexible cystoscope introduced, without any difficulty.   - Thorough search of the bladder revealed:    normal urethral meatus    normal urothelium    no stones    no ulcers     no tumors    no urethral polyps    no trabeculation  - Ureteral orifices were normal in position and appearance.  Post-Procedure: - Patient tolerated the procedure well  Assessment/ Plan:  1. Gross hematuria Cystoscopy unremarkable today No bladder pathology Likely related to #2 insetting of chronic anticoagulation - Urinalysis, Complete - ciprofloxacin (CIPRO) tablet 500 mg; Take 1 tablet (500 mg total) by mouth once. - lidocaine (XYLOCAINE) 2 % jelly 1 application; Place 1 application into the urethra once.  2. Staghorn calculus Large right staghorn calculus  No dramatic change in stone size since 08/2017 compared to most recent CT urogram on 06/07/2017, no obvious hydronephrosis or obstruction Complex surgical patient insetting of anticoagulation, severe  scoliosis, and Jehovah's Witness I have offered her referral to a tertiary care urologist for consideration of surgical intervention given the complexity of this case, the patient declined this time We'll continue to follow conservatively- warning symptoms reviewed  Return in about 3 months (around 09/13/2017) for UA .  Hollice Espy, MD

## 2017-06-30 ENCOUNTER — Ambulatory Visit: Payer: Medicare HMO | Admitting: Family Medicine

## 2017-07-04 ENCOUNTER — Ambulatory Visit (INDEPENDENT_AMBULATORY_CARE_PROVIDER_SITE_OTHER): Payer: Medicare HMO | Admitting: Podiatry

## 2017-07-04 DIAGNOSIS — Q828 Other specified congenital malformations of skin: Secondary | ICD-10-CM | POA: Diagnosis not present

## 2017-07-04 NOTE — Progress Notes (Signed)
This patient presents the office with chief complaint of a painful callus under the outside ball of both feet. She says the calluses have become very painful  walking and wearing her shoes.   Examination of her previous visits reveal that she had the same problem in 2016 as she has today.  She presents the office today for an evaluation and treatment of her painful calluses both feet  GENERAL APPEARANCE: Alert, conversant. Appropriately groomed. No acute distress.  VASCULAR: Pedal pulses are  palpable at  [facility] and PT bilateral.  Capillary refill time is immediate to all digits,  Normal temperature gradient.  Digital hair growth is present bilateral  NEUROLOGIC: sensation is normal to 5.07 monofilament at 5/5 sites bilateral.  Light touch is intact bilateral, Muscle strength normal.  MUSCULOSKELETAL: acceptable muscle strength, tone and stability bilateral.  Intrinsic muscluature intact bilateral.  Plantarflexed fifth metatarsal both feet.  DERMATOLOGIC: skin color, texture, and turgor are within normal limits.  No preulcerative lesions or ulcers  are seen, no interdigital maceration noted.  No open lesions present.  Digital nails are asymptomatic. No drainage noted. White necrotic tissue under 5th metatarsal right foot.   Painful callus sub 5th met left foot.  Chemical burn sub 5th metatarsal right foot.  Plantarflexed 5th met B/L.  ROV  Debridement of porokeratosis both feet.   Discussed the possibility of surgical intervention if this problem persists. Return to clinic when necessary   Gardiner Barefoot DPM .

## 2017-08-04 DIAGNOSIS — T148XXA Other injury of unspecified body region, initial encounter: Secondary | ICD-10-CM | POA: Insufficient documentation

## 2017-09-13 ENCOUNTER — Other Ambulatory Visit: Payer: Medicare HMO

## 2017-09-13 ENCOUNTER — Other Ambulatory Visit: Payer: Self-pay

## 2017-09-13 DIAGNOSIS — R310 Gross hematuria: Secondary | ICD-10-CM

## 2017-09-13 LAB — URINALYSIS, COMPLETE
BILIRUBIN UA: NEGATIVE
Glucose, UA: NEGATIVE
KETONES UA: NEGATIVE
NITRITE UA: NEGATIVE
SPEC GRAV UA: 1.015 (ref 1.005–1.030)
Urobilinogen, Ur: 0.2 mg/dL (ref 0.2–1.0)
pH, UA: 7 (ref 5.0–7.5)

## 2017-09-13 LAB — MICROSCOPIC EXAMINATION: RBC, UA: >30 /hpf — ABNORMAL HIGH (ref 0–?)

## 2017-10-06 ENCOUNTER — Encounter: Payer: Self-pay | Admitting: Podiatry

## 2017-10-06 ENCOUNTER — Ambulatory Visit: Payer: Medicare HMO | Admitting: Podiatry

## 2017-10-06 DIAGNOSIS — Q828 Other specified congenital malformations of skin: Secondary | ICD-10-CM

## 2017-10-06 NOTE — Progress Notes (Signed)
This patient presents the office with chief complaint of a painful callus under the outside ball of both feet. She says the calluses have become very painful  walking and wearing her shoes.     She presents the office today for an evaluation and treatment of her painful calluses both feet  GENERAL APPEARANCE: Alert, conversant. Appropriately groomed. No acute distress.  VASCULAR: Pedal pulses are  palpable at  [facility] and PT bilateral.  Capillary refill time is immediate to all digits,  Normal temperature gradient.   NEUROLOGIC: sensation is normal to 5.07 monofilament at 5/5 sites bilateral.  Light touch is intact bilateral, Muscle strength normal.  MUSCULOSKELETAL: acceptable muscle strength, tone and stability bilateral.  Intrinsic muscluature intact bilateral.  Plantarflexed fifth metatarsal both feet.  DERMATOLOGIC: skin color, texture, and turgor are within normal limits.  No preulcerative lesions or ulcers  are seen, no interdigital maceration noted.  No open lesions present.  Digital nails are asymptomatic. No drainage noted.   Painful callus sub 5th met left foot.   Porokeratosis  B/L    Plantarflexed 5th met B/L.  ROV  Debridement of porokeratosis both feet   Return to clinic 10 weeks.   Gardiner Barefoot DPM .

## 2017-11-08 ENCOUNTER — Ambulatory Visit: Payer: Medicare HMO | Admitting: Urology

## 2017-11-09 ENCOUNTER — Ambulatory Visit: Payer: Medicare HMO | Admitting: Urology

## 2017-11-09 ENCOUNTER — Encounter: Payer: Self-pay | Admitting: Urology

## 2017-11-09 VITALS — BP 121/74 | HR 55 | Ht 62.0 in | Wt 140.0 lb

## 2017-11-09 DIAGNOSIS — N200 Calculus of kidney: Secondary | ICD-10-CM

## 2017-11-09 DIAGNOSIS — R310 Gross hematuria: Secondary | ICD-10-CM

## 2017-11-09 LAB — URINALYSIS, COMPLETE
Bilirubin, UA: NEGATIVE
Glucose, UA: NEGATIVE
Ketones, UA: NEGATIVE
Nitrite, UA: NEGATIVE
PH UA: 7.5 (ref 5.0–7.5)
Specific Gravity, UA: 1.02 (ref 1.005–1.030)
Urobilinogen, Ur: 0.2 mg/dL (ref 0.2–1.0)

## 2017-11-09 LAB — MICROSCOPIC EXAMINATION
RBC, UA: >30 /hpf — ABNORMAL HIGH (ref 0–?)
WBC, UA: >30 /hpf — ABNORMAL HIGH (ref 0–?)

## 2017-11-09 NOTE — Progress Notes (Signed)
11/09/2017 1:43 PM   Regina Mathis 1941-04-15 [id number]  Referring provider: Casilda Carls, MD [address] [place], [place] [address]  Chief Complaint  Patient presents with  . Hematuria    HPI: 77 yo WF with severe scoliosis, dementia, CHF and a staghorn calculus who is referred back to [place] for gross hematuria.  Background history She does have baseline urgency, frequency, and urge incontinence which is unchanged and stable.  Review of her medical record show that she underwent a CT scan ordered by her previous PCP on 08/27/2016 for further evaluation of chronic bladder infections and microscopic hematuria.  This showed a large right sided staghorn calculus measuring 3.7 x 1.3 x 1.1 in the upper pole and 3.1 x 1.4 x 1.5 in the lower pole.  She did have a fullness of her collecting system associated with a staghorn.  She also had incidental finding of abnormal endometrial thickening in this problem seemed to take precedence.  She since undergone further evaluation and treatment with Dr. Larey Days.  Additionally, review of records reveal evidence of chronic asymptomatic bacteriuria.  CTU performed on 06/06/2017 noted nonobstructive right renal staghorn calculi.  No additional findings to explain patient's  Hematuria.  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  Aortic atherosclerosis.  Cystoscopy was unremarkable on 06/14/2017.    Today, she complains is concerned that she may be losing too much blood in her urine.  She has dementia, so it is difficult for her to be precise with her history.  She states her urine is dark, ranging from a cranberry juice color to dark yellow.  Today's urine color is yellow.  UA is positive for > 30 WBC's, > 30 RBC's and moderate bacteria.  She is having the complaints of frequency, urgency, nocturia an incontinence.  These are baseline.    She completed a hematuria workup in 05/2017 with CTU and  cystoscopy.  No malignancies were discovered.    She does have baseline severe scoliosis. Additionally, she is a Restaurant manager, fast food.   PMH: Past Medical History:  Diagnosis Date  . GERD (gastroesophageal reflux disease)   . Hyperlipidemia   . Hypertension   . Osteoarthritis   . Pre-diabetes   . Scoliosis deformity of spine   . Valvular heart disease    Echocardiogram 2014- moderate mitral regurg, moderate tricuspid regurg, moderate dilated left atrium and right atrium, EF 50-55%    Surgical History: Past Surgical History:  Procedure Laterality Date  . CATARACT EXTRACTION W/ INTRAOCULAR LENS  IMPLANT, BILATERAL    . CHOLECYSTECTOMY    . COLONOSCOPY WITH PROPOFOL N/A 01/16/2016   Procedure: COLONOSCOPY WITH PROPOFOL;  Surgeon: [name], MD;  Location: [facility] ENDOSCOPY;  Service: Endoscopy;  Laterality: N/A;  . HERNIA REPAIR     with Bowel Involvement (04/2012)  . HYSTEROSCOPY W/D&C N/A 01/14/2017   Procedure: DILATATION AND CURETTAGE /HYSTEROSCOPY;  Surgeon: Honor Loh Ward, MD;  Location: [facility] ORS;  Service: Gynecology;  Laterality: N/A;  . 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 Lumpectomy Chest Area    . TOTAL KNEE ARTHROPLASTY Bilateral     Home Medications:  Allergies as of 11/09/2017      Reactions   Other       Medication List        Accurate as of 11/09/17 11:59 PM. Always use your most recent med list.  apixaban 5 MG Tabs tablet Commonly known as:  ELIQUIS Take 1 tablet (5 mg total) by mouth 2 (two) times daily.   donepezil 5 MG tablet Commonly known as:  ARICEPT Take 1 tablet (5 mg total) by mouth at bedtime.   lisinopril-hydrochlorothiazide 20-25 MG tablet Commonly known as:  PRINZIDE,ZESTORETIC Take 1 tablet by mouth daily.   memantine 5 MG tablet Commonly known as:  NAMENDA   PROBIOTIC-10 Chew Chew 1 tablet by mouth 2 (two) times daily.   ranitidine 150 MG tablet Commonly known as:   ZANTAC Take 1 tablet (150 mg total) by mouth 2 (two) times daily.   simvastatin 40 MG tablet Commonly known as:  ZOCOR Take 1 tablet (40 mg total) by mouth daily.   traMADol 50 MG tablet Commonly known as:  ULTRAM Take 1 tablet (50 mg total) by mouth every 8 (eight) hours as needed.   Vitamin D3 2000 units Tabs Take 2,000 Units by mouth daily.       Allergies:  Allergies  Allergen Reactions  . Other     Family History: Family History  Problem Relation Age of Onset  . CAD Mother   . Diabetes Mellitus II Mother   . Stroke Mother   . Lung cancer Father   . Diabetes Mellitus II Sister   . Diabetes Mellitus II Brother   . Breast cancer Neg Hx   . Bladder Cancer Neg Hx   . Kidney cancer Neg Hx     Social History:  reports that  has never smoked. she has never used smokeless tobacco. She reports that she does not drink alcohol or use drugs.  ROS: UROLOGY Frequent Urination?: Yes Hard to postpone urination?: Yes Burning/pain with urination?: No Get up at night to urinate?: Yes Leakage of urine?: Yes Urine stream starts and stops?: No Trouble starting stream?: No Blood in urine?: Yes Urinary tract infection?: No Sexually transmitted disease?: No Injury to kidneys or bladder?: No Painful intercourse?: No Weak stream?: No Currently pregnant?: No Vaginal bleeding?: No Last menstrual period?: n  Gastrointestinal Nausea?: No Vomiting?: No Indigestion/heartburn?: Yes Diarrhea?: No Constipation?: No  Constitutional Fever: No Night sweats?: No Weight loss?: No Fatigue?: No  Skin Skin rash/lesions?: No Itching?: Yes  Eyes Blurred vision?: No Double vision?: No  Ears/Nose/Throat Sore throat?: No Sinus problems?: No  Hematologic/Lymphatic Swollen glands?: No Easy bruising?: Yes  Cardiovascular Leg swelling?: Yes Chest pain?: Yes  Respiratory Cough?: No Shortness of breath?: No  Endocrine Excessive thirst?: No  Musculoskeletal Back pain?:  Yes Joint pain?: Yes  Neurological Headaches?: No Dizziness?: Yes  Psychologic Depression?: No Anxiety?: No  Physical Exam: BP 121/74   Pulse (!) 55   Ht 5\' 2"  (1.575 m)   Wt 140 lb (63.5 kg)   BMI 25.61 kg/m   Constitutional: Well nourished. Alert and oriented, No acute distress. HEENT: [place] AT, moist mucus membranes. Trachea midline, no masses. Cardiovascular: No clubbing, cyanosis, or edema. Respiratory: Normal respiratory effort, no increased work of breathing. Skin: No rashes, bruises or suspicious lesions. Lymph: No cervical or inguinal adenopathy. Neurologic: Grossly intact, no focal deficits, moving all 4 extremities. Psychiatric: Normal mood and affect.  Laboratory Data: Lab Results  Component Value Date   WBC 4.0 11/09/2017   HGB 12.6 11/09/2017   HCT 36.2 11/09/2017   MCV 87 11/09/2017   PLT 197 11/09/2017    Lab Results  Component Value Date   CREATININE 0.97 11/09/2017    Urinalysis >30WBC's, >30RBC's and moderate bacteria.  See Epic.    Pertinent Imaging: CLINICAL DATA:  Low back pain.  Intermittent hematuria.  EXAM: CT ABDOMEN AND PELVIS WITHOUT AND WITH CONTRAST  TECHNIQUE: Multidetector CT imaging of the abdomen and pelvis was performed following the standard protocol before and following the bolus administration of intravenous contrast.  CONTRAST:  151mL ISOVUE-300 IOPAMIDOL (ISOVUE-300) INJECTION 61%  COMPARISON:  08/27/2016  FINDINGS: Lower chest: No acute abnormality. There is a defect within the medial aspect of the right hemidiaphragm through which a abdominal fat herniates into the right CP angle, image 13 of series 5.  Hepatobiliary: No focal liver abnormality is seen. Status post cholecystectomy. The common bile duct measures 8 mm in maximum diameter. No obstructing stone or mass noted.  Pancreas: Unremarkable. No pancreatic ductal dilatation or surrounding inflammatory changes.  Spleen: Normal in size without  focal abnormality.  Adrenals/Urinary Tract: Normal adrenal glands. No left renal calculi. Right-sided staghorn calculus is identified upper pole component measures 2.1 cm. The inferior pole component measures 3.1 cm. Several small right kidney cysts noted. No ureteral calculi. The urinary bladder is normal.  Stomach/Bowel: Stomach is within normal limits. No evidence of bowel wall thickening, distention, or inflammatory changes.  Vascular/Lymphatic: Aortic atherosclerosis. No aneurysm. No pelvic or inguinal adenopathy.  Reproductive: Uterus and bilateral adnexa are unremarkable.  Other: No abdominal wall hernia or abnormality. No abdominopelvic ascites.  Musculoskeletal: There is a marked scoliosis deformity involving the lumbar spine which is convex towards the right. Multi level lumbar degenerative disc disease is identified.  IMPRESSION: 1. Nonobstructive right renal staghorn calculi. 2. No additional findings to explain patient's hematuria. 3. 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 4. Aortic atherosclerosis.   Electronically Signed   By: Kerby Moors M.D.   On: 06/06/2017 15:46  Assessment & Plan:    1. Gross hematuria Likely secondary to large staghorn in the setting of anticoagulation - Urinalysis, Complete -CBC drawn today -follow up pending CBC results -warning symptoms reviewed  2. Staghorn calculus Large right staghorn calculus  No dramatic change in stone size since 08/2017 compared to most recent CT urogram on 06/07/2017, no obvious hydronephrosis or obstruction Complex surgical patient insetting of anticoagulation, severe scoliosis, and Jehovah's Witness I have again offered her referral to a tertiary care urologist for consideration of surgical intervention given the complexity of this case, the patient declined this time We'll continue to follow conservatively- warning symptoms  reviewed  Return for pending CBC.  Zara Council, PA-C  [facility] [address] [place], [place] [address] [phone]

## 2017-11-10 LAB — CBC WITH DIFFERENTIAL/PLATELET
BASOS ABS: 0 10*3/uL (ref 0.0–0.2)
Basos: 0 %
EOS (ABSOLUTE): 0 10*3/uL (ref 0.0–0.4)
Eos: 1 %
HEMATOCRIT: 36.2 % (ref 34.0–46.6)
Hemoglobin: 12.6 g/dL (ref 11.1–15.9)
IMMATURE GRANULOCYTES: 0 %
Immature Grans (Abs): 0 10*3/uL (ref 0.0–0.1)
LYMPHS ABS: 0.5 10*3/uL — AB (ref 0.7–3.1)
Lymphs: 12 %
MCH: 30.3 pg (ref 26.6–33.0)
MCHC: 34.8 g/dL (ref 31.5–35.7)
MCV: 87 fL (ref 79–97)
MONOS ABS: 0.3 10*3/uL (ref 0.1–0.9)
Monocytes: 7 %
NEUTROS PCT: 80 %
Neutrophils Absolute: 3.2 10*3/uL (ref 1.4–7.0)
PLATELETS: 197 10*3/uL (ref 150–379)
RBC: 4.16 x10E6/uL (ref 3.77–5.28)
RDW: 14.2 % (ref 12.3–15.4)
WBC: 4 10*3/uL (ref 3.4–10.8)

## 2017-11-10 LAB — BUN+CREAT
BUN / CREAT RATIO: 21 (ref 12–28)
BUN: 20 mg/dL (ref 8–27)
CREATININE: 0.97 mg/dL (ref 0.57–1.00)
GFR, EST AFRICAN AMERICAN: 66 mL/min/{1.73_m2} (ref >59)
GFR, EST NON AFRICAN AMERICAN: 57 mL/min/{1.73_m2} — AB (ref >59)

## 2017-11-11 ENCOUNTER — Telehealth: Payer: Self-pay

## 2017-11-11 NOTE — Telephone Encounter (Signed)
Spoke with pt in reference to labs results and possible referral. Pt stated that she would prefer to f/u with [facility] in 3 mo. 67mo f/u appt made. Pt voiced understanding.

## 2017-11-11 NOTE — Telephone Encounter (Signed)
-----   Message from Nori Riis, PA-C sent at 11/10/2017  7:32 AM EST ----- Please let Mrs. Roedel know that her blood work has been stable for the last 10 months.  We can certainly refer her on to [facility] or [facility] at this time, if not I would like to see her in three months.  If the urine turns to frank blood or the dark colored urine starts to occur more frequently, she should RTC sooner.

## 2017-11-12 LAB — CULTURE, URINE COMPREHENSIVE

## 2017-11-23 ENCOUNTER — Ambulatory Visit: Payer: Medicare HMO | Admitting: Urology

## 2017-12-15 ENCOUNTER — Encounter: Payer: Self-pay | Admitting: Podiatry

## 2017-12-15 ENCOUNTER — Ambulatory Visit (INDEPENDENT_AMBULATORY_CARE_PROVIDER_SITE_OTHER): Payer: Medicare HMO | Admitting: Podiatry

## 2017-12-15 DIAGNOSIS — Q828 Other specified congenital malformations of skin: Secondary | ICD-10-CM

## 2017-12-15 NOTE — Progress Notes (Signed)
This patient presents the office with chief complaint of a painful callus under the outside ball of both feet. She says the calluses have become very painful  walking and wearing her shoes.     She presents the office today for an evaluation and treatment of her painful calluses both feet  GENERAL APPEARANCE: Alert, conversant. Appropriately groomed. No acute distress.  VASCULAR: Pedal pulses are  palpable at  [facility] and PT bilateral.  Capillary refill time is immediate to all digits,  Normal temperature gradient.   NEUROLOGIC: sensation is normal to 5.07 monofilament at 5/5 sites bilateral.  Light touch is intact bilateral, Muscle strength normal.  MUSCULOSKELETAL: acceptable muscle strength, tone and stability bilateral.  Intrinsic muscluature intact bilateral.  Plantarflexed fifth metatarsal both feet.  DERMATOLOGIC: skin color, texture, and turgor are within normal limits.  No preulcerative lesions or ulcers  are seen, no interdigital maceration noted.  No open lesions present.  Digital nails are asymptomatic. No drainage noted.   Painful callus sub 5th met left foot.   Porokeratosis  B/L    Plantarflexed 5th met B/L.  ROV  Debridement of porokeratosis both feet   Return to clinic 10 weeks..  Padding dispensed.   Gardiner Barefoot DPM .

## 2018-01-16 ENCOUNTER — Other Ambulatory Visit: Payer: Self-pay | Admitting: Neurology

## 2018-01-16 ENCOUNTER — Other Ambulatory Visit: Payer: Self-pay | Admitting: Student

## 2018-01-16 DIAGNOSIS — R4189 Other symptoms and signs involving cognitive functions and awareness: Secondary | ICD-10-CM

## 2018-01-23 ENCOUNTER — Ambulatory Visit: Payer: Medicare HMO

## 2018-01-31 ENCOUNTER — Ambulatory Visit
Admission: RE | Admit: 2018-01-31 | Discharge: 2018-01-31 | Disposition: A | Discharge status: Home / Self Care | Payer: Medicare HMO | Source: Ambulatory Visit | Attending: Neurology | Admitting: Neurology

## 2018-01-31 DIAGNOSIS — R4189 Other symptoms and signs involving cognitive functions and awareness: Secondary | ICD-10-CM

## 2018-01-31 DIAGNOSIS — I6782 Cerebral ischemia: Secondary | ICD-10-CM | POA: Diagnosis not present

## 2018-02-07 NOTE — Progress Notes (Deleted)
02/08/2018 12:21 PM   Regina Mathis [birth year] [id number]  Referring provider: Casilda Carls, MD No address on file  No chief complaint on file.   HPI: 77 yo WF with severe scoliosis, dementia, CHF, a staghorn calculus and gross hematuria who presents today for a three month follow up.    Background history She does have baseline urgency, frequency, and urge incontinence which is unchanged and stable.  Review of her medical record show that she underwent a CT scan ordered by her previous PCP on 08/27/2016 for further evaluation of chronic bladder infections and microscopic hematuria.  This showed a large right sided staghorn calculus measuring 3.7 x 1.3 x 1.1 in the upper pole and 3.1 x 1.4 x 1.5 in the lower pole.  She did have a fullness of her collecting system associated with a staghorn.  She also had incidental finding of abnormal endometrial thickening in this problem seemed to take precedence.  She since undergone further evaluation and treatment with Dr. Larey Days.  Additionally, review of records reveal evidence of chronic asymptomatic bacteriuria.  CTU performed on 06/06/2017 noted nonobstructive right renal staghorn calculi.  No additional findings to explain patient's  Hematuria.  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  Aortic atherosclerosis.  Cystoscopy was unremarkable on 06/14/2017.    Today, ***she complains is concerned that she may be losing too much blood in her urine.  She has dementia, so it is difficult for her to be precise with her history.  She states her urine is dark, ranging from a cranberry juice color to dark yellow.  Today's urine color is yellow.  UA is positive for > 30 WBC's, > 30 RBC's and moderate bacteria.  She is having the complaints of frequency, urgency, nocturia an incontinence.  These are baseline.    She does have baseline severe scoliosis. Additionally, she is a Teacher, adult education.   PMH: Past Medical History:  Diagnosis Date  . GERD (gastroesophageal reflux disease)   . Hyperlipidemia   . Hypertension   . Osteoarthritis   . Pre-diabetes   . Scoliosis deformity of spine   . Valvular heart disease    Echocardiogram 2014- moderate mitral regurg, moderate tricuspid regurg, moderate dilated left atrium and right atrium, EF 50-55%    Surgical History: Past Surgical History:  Procedure Laterality Date  . CATARACT EXTRACTION W/ INTRAOCULAR LENS  IMPLANT, BILATERAL    . CHOLECYSTECTOMY    . COLONOSCOPY WITH PROPOFOL N/A 01/16/2016   Procedure: COLONOSCOPY WITH PROPOFOL;  Surgeon: [name], MD;  Location[facility] ENDOSCOPY;  Service: Endoscopy;  Laterality: N/A;  . HERNIA REPAIR     with Bowel Involvement (04/2012)  . HYSTEROSCOPY W/D&C N/A 01/14/2017   Procedure: DILATATION AND CURETTAGE /HYSTEROSCOPY;  Surgeon: Honor Loh Ward, MD;  Location: [facility] ORS;  Service: Gynecology;  Laterality: N/A;  . 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 Lumpectomy Chest Area    . TOTAL KNEE ARTHROPLASTY Bilateral     Home Medications:  Allergies as of 02/08/2018      Reactions   Other       Medication List        Accurate as of 02/07/18 12:21 PM. Always use your most recent med list.          apixaban 5 MG Tabs tablet Commonly known as:  ELIQUIS Take 1 tablet (5 mg total) by mouth 2 (two)  times daily.   donepezil 5 MG tablet Commonly known as:  ARICEPT TAKE 1 TABLET BY MOUTH NIGHTLY   furosemide 20 MG tablet Commonly known as:  LASIX   lisinopril-hydrochlorothiazide 20-25 MG tablet Commonly known as:  PRINZIDE,ZESTORETIC Take 1 tablet by mouth daily.   memantine 5 MG tablet Commonly known as:  NAMENDA   PROBIOTIC-10 Chew Chew 1 tablet by mouth 2 (two) times daily.   ranitidine 150 MG tablet Commonly known as:  ZANTAC Take 1 tablet (150 mg total) by mouth 2 (two) times daily.   simvastatin 40 MG  tablet Commonly known as:  ZOCOR Take 1 tablet (40 mg total) by mouth daily.   traMADol 50 MG tablet Commonly known as:  ULTRAM Take 1 tablet (50 mg total) by mouth every 8 (eight) hours as needed.   Vitamin D3 2000 units Tabs Take 2,000 Units by mouth daily.       Allergies:  Allergies  Allergen Reactions  . Other     Family History: Family History  Problem Relation Age of Onset  . CAD Mother   . Diabetes Mellitus II Mother   . Stroke Mother   . Lung cancer Father   . Diabetes Mellitus II Sister   . Diabetes Mellitus II Brother   . Breast cancer Neg Hx   . Bladder Cancer Neg Hx   . Kidney cancer Neg Hx     Social History:  reports that she has never smoked. She has never used smokeless tobacco. She reports that she does not drink alcohol or use drugs.  ROS:                                        Physical Exam: There were no vitals taken for this visit.  Constitutional: Well nourished. Alert and oriented, No acute distress. HEENT: [place] AT, moist mucus membranes. Trachea midline, no masses. Cardiovascular: No clubbing, cyanosis, or edema. Respiratory: Normal respiratory effort, no increased work of breathing. Skin: No rashes, bruises or suspicious lesions. Lymph: No cervical or inguinal adenopathy. Neurologic: Grossly intact, no focal deficits, moving all 4 extremities. Psychiatric: Normal mood and affect.  Laboratory Data: Lab Results  Component Value Date   WBC 4.0 11/09/2017   HGB 12.6 11/09/2017   HCT 36.2 11/09/2017   MCV 87 11/09/2017   PLT 197 11/09/2017    Lab Results  Component Value Date   CREATININE 0.97 11/09/2017    Urinalysis ***  See Epic.    Assessment & Plan:    1. Gross hematuria Likely secondary to large staghorn in the setting of anticoagulation - Urinalysis, Complete -CBC drawn today -follow up pending CBC results -warning symptoms reviewed  2. Staghorn calculus Large right staghorn calculus  No  dramatic change in stone size since 08/2017 compared to most recent CT urogram on 06/07/2017, no obvious hydronephrosis or obstruction Complex surgical patient insetting of anticoagulation, severe scoliosis, and Jehovah's Witness I have again offered her referral to a tertiary care urologist for consideration of surgical intervention given the complexity of this case, the patient declined this time We'll continue to follow conservatively- warning symptoms reviewed  No follow-ups on file.  Zara Council, PA-C  [facility] [address] [place], [place] [address] [phone]

## 2018-02-08 ENCOUNTER — Ambulatory Visit: Payer: Medicare HMO | Admitting: Urology

## 2018-02-09 ENCOUNTER — Ambulatory Visit: Payer: Medicare HMO | Admitting: Urology

## 2018-02-09 ENCOUNTER — Encounter: Payer: Self-pay | Admitting: Urology

## 2018-02-09 VITALS — BP 152/74 | HR 57 | Wt 140.3 lb

## 2018-02-09 DIAGNOSIS — R310 Gross hematuria: Secondary | ICD-10-CM | POA: Diagnosis not present

## 2018-02-09 DIAGNOSIS — N200 Calculus of kidney: Secondary | ICD-10-CM

## 2018-02-09 NOTE — Progress Notes (Signed)
02/09/2018 2:46 PM   Regina Mathis 1940-11-03 [id number]  Referring provider: Casilda Carls, MD No address on file  Chief Complaint  Patient presents with  . Nephrolithiasis    HPI: 77 yo WF with severe scoliosis, dementia, CHF, a staghorn calculus and gross hematuria who presents today for a three month follow up with her sister, Regina Mathis.    Background history She does have baseline urgency, frequency, and urge incontinence which is unchanged and stable.  Review of her medical record show that she underwent a CT scan ordered by her previous PCP on 08/27/2016 for further evaluation of chronic bladder infections and microscopic hematuria.  This showed a large right sided staghorn calculus measuring 3.7 x 1.3 x 1.1 in the upper pole and 3.1 x 1.4 x 1.5 in the lower pole.  She did have a fullness of her collecting system associated with a staghorn.  She also had incidental finding of abnormal endometrial thickening in this problem seemed to take precedence.  She since undergone further evaluation and treatment with Dr. Larey Days.  Additionally, review of records reveal evidence of chronic asymptomatic bacteriuria.  CTU performed on 06/06/2017 noted nonobstructive right renal staghorn calculi.  No additional findings to explain patient's  Hematuria.  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  Aortic atherosclerosis.  Cystoscopy was unremarkable on 06/14/2017.    Today, she is complaining of frequency, urgency, nocturia and incontinence.  These are baseline symptoms.  Patient denies any gross hematuria, dysuria or suprapubic/flank pain.  Patient denies any fevers, chills, nausea or vomiting.        She does have baseline severe scoliosis. Additionally, she is a Restaurant manager, fast food.   PMH: Past Medical History:  Diagnosis Date  . GERD (gastroesophageal reflux disease)   . Hyperlipidemia   . Hypertension   . Osteoarthritis   .  Pre-diabetes   . Scoliosis deformity of spine   . Valvular heart disease    Echocardiogram 2014- moderate mitral regurg, moderate tricuspid regurg, moderate dilated left atrium and right atrium, EF 50-55%    Surgical History: Past Surgical History:  Procedure Laterality Date  . CATARACT EXTRACTION W/ INTRAOCULAR LENS  IMPLANT, BILATERAL    . CHOLECYSTECTOMY    . COLONOSCOPY WITH PROPOFOL N/A 01/16/2016   Procedure: COLONOSCOPY WITH PROPOFOL;  Surgeon: [name], MD;  Location[facility] ENDOSCOPY;  Service: Endoscopy;  Laterality: N/A;  . HERNIA REPAIR     with Bowel Involvement (04/2012)  . HYSTEROSCOPY W/D&C N/A 01/14/2017   Procedure: DILATATION AND CURETTAGE /HYSTEROSCOPY;  Surgeon: Honor Loh Ward, MD;  Location: [facility] ORS;  Service: Gynecology;  Laterality: N/A;  . 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 Lumpectomy Chest Area    . TOTAL KNEE ARTHROPLASTY Bilateral     Home Medications:  Allergies as of 02/09/2018      Reactions   Other       Medication List        Accurate as of 02/09/18  2:46 PM. Always use your most recent med list.          apixaban 5 MG Tabs tablet Commonly known as:  ELIQUIS Take 1 tablet (5 mg total) by mouth 2 (two) times daily.   donepezil 5 MG tablet Commonly known as:  ARICEPT TAKE 1 TABLET BY MOUTH NIGHTLY   furosemide 20 MG tablet Commonly known as:  LASIX   lisinopril-hydrochlorothiazide 20-25 MG tablet  Commonly known as:  PRINZIDE,ZESTORETIC Take 1 tablet by mouth daily.   memantine 5 MG tablet Commonly known as:  NAMENDA   PROBIOTIC-10 Chew Chew 1 tablet by mouth 2 (two) times daily.   ranitidine 150 MG tablet Commonly known as:  ZANTAC Take 1 tablet (150 mg total) by mouth 2 (two) times daily.   simvastatin 40 MG tablet Commonly known as:  ZOCOR Take 1 tablet (40 mg total) by mouth daily.   traMADol 50 MG tablet Commonly known as:  ULTRAM Take 1 tablet (50 mg total) by mouth  every 8 (eight) hours as needed.   vitamin C 1000 MG tablet Take by mouth.   Vitamin D3 2000 units Tabs Take 2,000 Units by mouth daily.       Allergies:  Allergies  Allergen Reactions  . Other     Family History: Family History  Problem Relation Age of Onset  . CAD Mother   . Diabetes Mellitus II Mother   . Stroke Mother   . Lung cancer Father   . Diabetes Mellitus II Sister   . Diabetes Mellitus II Brother   . Breast cancer Neg Hx   . Bladder Cancer Neg Hx   . Kidney cancer Neg Hx     Social History:  reports that she has never smoked. She has never used smokeless tobacco. She reports that she does not drink alcohol or use drugs.  ROS: UROLOGY Frequent Urination?: Yes Hard to postpone urination?: Yes Burning/pain with urination?: No Get up at night to urinate?: Yes Leakage of urine?: Yes Urine stream starts and stops?: No Trouble starting stream?: No Do you have to strain to urinate?: No Blood in urine?: No Urinary tract infection?: No Sexually transmitted disease?: No Injury to kidneys or bladder?: No Painful intercourse?: No Weak stream?: No Currently pregnant?: No Vaginal bleeding?: No Last menstrual period?: N/A  Gastrointestinal Nausea?: No Vomiting?: No Indigestion/heartburn?: Yes Diarrhea?: No Constipation?: No  Constitutional Fever: No Night sweats?: No Fatigue?: Yes  Skin Skin rash/lesions?: No Itching?: Yes  Eyes Blurred vision?: No Double vision?: No  Ears/Nose/Throat Sore throat?: No Sinus problems?: No  Hematologic/Lymphatic Swollen glands?: No Easy bruising?: Yes  Cardiovascular Leg swelling?: Yes Chest pain?: Yes  Respiratory Cough?: No Shortness of breath?: No  Endocrine Excessive thirst?: No  Musculoskeletal Back pain?: Yes Joint pain?: Yes  Neurological Headaches?: No Dizziness?: Yes  Psychologic Depression?: No Anxiety?: No  Physical Exam: There were no vitals taken for this visit.    Constitutional: Well nourished. Alert and oriented, No acute distress. HEENT: [place] AT, moist mucus membranes. Trachea midline, no masses. Cardiovascular: No clubbing, cyanosis, or edema. Respiratory: Normal respiratory effort, no increased work of breathing. Skin: No rashes, bruises or suspicious lesions. Lymph: No cervical or inguinal adenopathy. Neurologic: Grossly intact, no focal deficits, moving all 4 extremities. Psychiatric: Normal mood and affect.  Laboratory Data: Lab Results  Component Value Date   WBC 4.0 11/09/2017   HGB 12.6 11/09/2017   HCT 36.2 11/09/2017   MCV 87 11/09/2017   PLT 197 11/09/2017    Lab Results  Component Value Date   CREATININE 0.97 11/09/2017   I have reviewed the labs  Assessment & Plan:    1. History of hematuria Likely secondary to large staghorn in the setting of anticoagulation CBC drawn today Warning symptoms reviewed RTC in 6 months for UA and CBC  2. Staghorn calculus Large right staghorn calculus  No dramatic change in stone size since 08/2016 compared to most  recent CT urogram on 06/07/2017, no obvious hydronephrosis or obstruction Complex surgical patient insetting of anticoagulation, severe scoliosis, and Jehovah's Witness She is not desiring any surgical procedure at this time We'll continue to follow conservatively- warning symptoms reviewed RTC in 6 months for BMP and KUB  Return in about 6 months (around 08/12/2018) for CBC, BMP, KUB and UA  and office visit .  Zara Council, PA-C  [facility] [address] [place], [place] [address] [phone]

## 2018-02-10 LAB — BASIC METABOLIC PANEL
BUN/Creatinine Ratio: 26 (ref 12–28)
BUN: 24 mg/dL (ref 8–27)
CALCIUM: 9.6 mg/dL (ref 8.7–10.3)
CO2: 28 mmol/L (ref 20–29)
CREATININE: 0.91 mg/dL (ref 0.57–1.00)
Chloride: 97 mmol/L (ref 96–106)
GFR, EST AFRICAN AMERICAN: 71 mL/min/{1.73_m2} (ref >59)
GFR, EST NON AFRICAN AMERICAN: 62 mL/min/{1.73_m2} (ref >59)
Glucose: 97 mg/dL (ref 65–99)
Potassium: 4.1 mmol/L (ref 3.5–5.2)
Sodium: 140 mmol/L (ref 134–144)

## 2018-02-10 LAB — CBC WITH DIFFERENTIAL/PLATELET
BASOS: 1 %
Basophils Absolute: 0 10*3/uL (ref 0.0–0.2)
EOS (ABSOLUTE): 0 10*3/uL (ref 0.0–0.4)
EOS: 1 %
HEMOGLOBIN: 13 g/dL (ref 11.1–15.9)
Hematocrit: 38.8 % (ref 34.0–46.6)
IMMATURE GRANS (ABS): 0 10*3/uL (ref 0.0–0.1)
IMMATURE GRANULOCYTES: 0 %
LYMPHS: 11 %
Lymphocytes Absolute: 0.6 10*3/uL — ABNORMAL LOW (ref 0.7–3.1)
MCH: 29.9 pg (ref 26.6–33.0)
MCHC: 33.5 g/dL (ref 31.5–35.7)
MCV: 89 fL (ref 79–97)
MONOCYTES: 8 %
Monocytes Absolute: 0.4 10*3/uL (ref 0.1–0.9)
NEUTROS ABS: 4 10*3/uL (ref 1.4–7.0)
Neutrophils: 79 %
Platelets: 215 10*3/uL (ref 150–379)
RBC: 4.35 x10E6/uL (ref 3.77–5.28)
RDW: 14.2 % (ref 12.3–15.4)
WBC: 5.1 10*3/uL (ref 3.4–10.8)

## 2018-02-11 IMAGING — CR DG CHEST 2V
1 series · 2 of 2 positions shown · non-contrast
Comparison: Bilateral shoulder radiographs performed 09/24/2014,
and chest radiograph performed 05/20/2005

CLINICAL DATA: Preoperative chest radiograph for surgery on uterine
mass. Initial encounter.

EXAM:
CHEST  2 VIEW

[Series 1: w chest pa · 0.14mm/px · 2 of 2 slices shown]
[im 1/2]
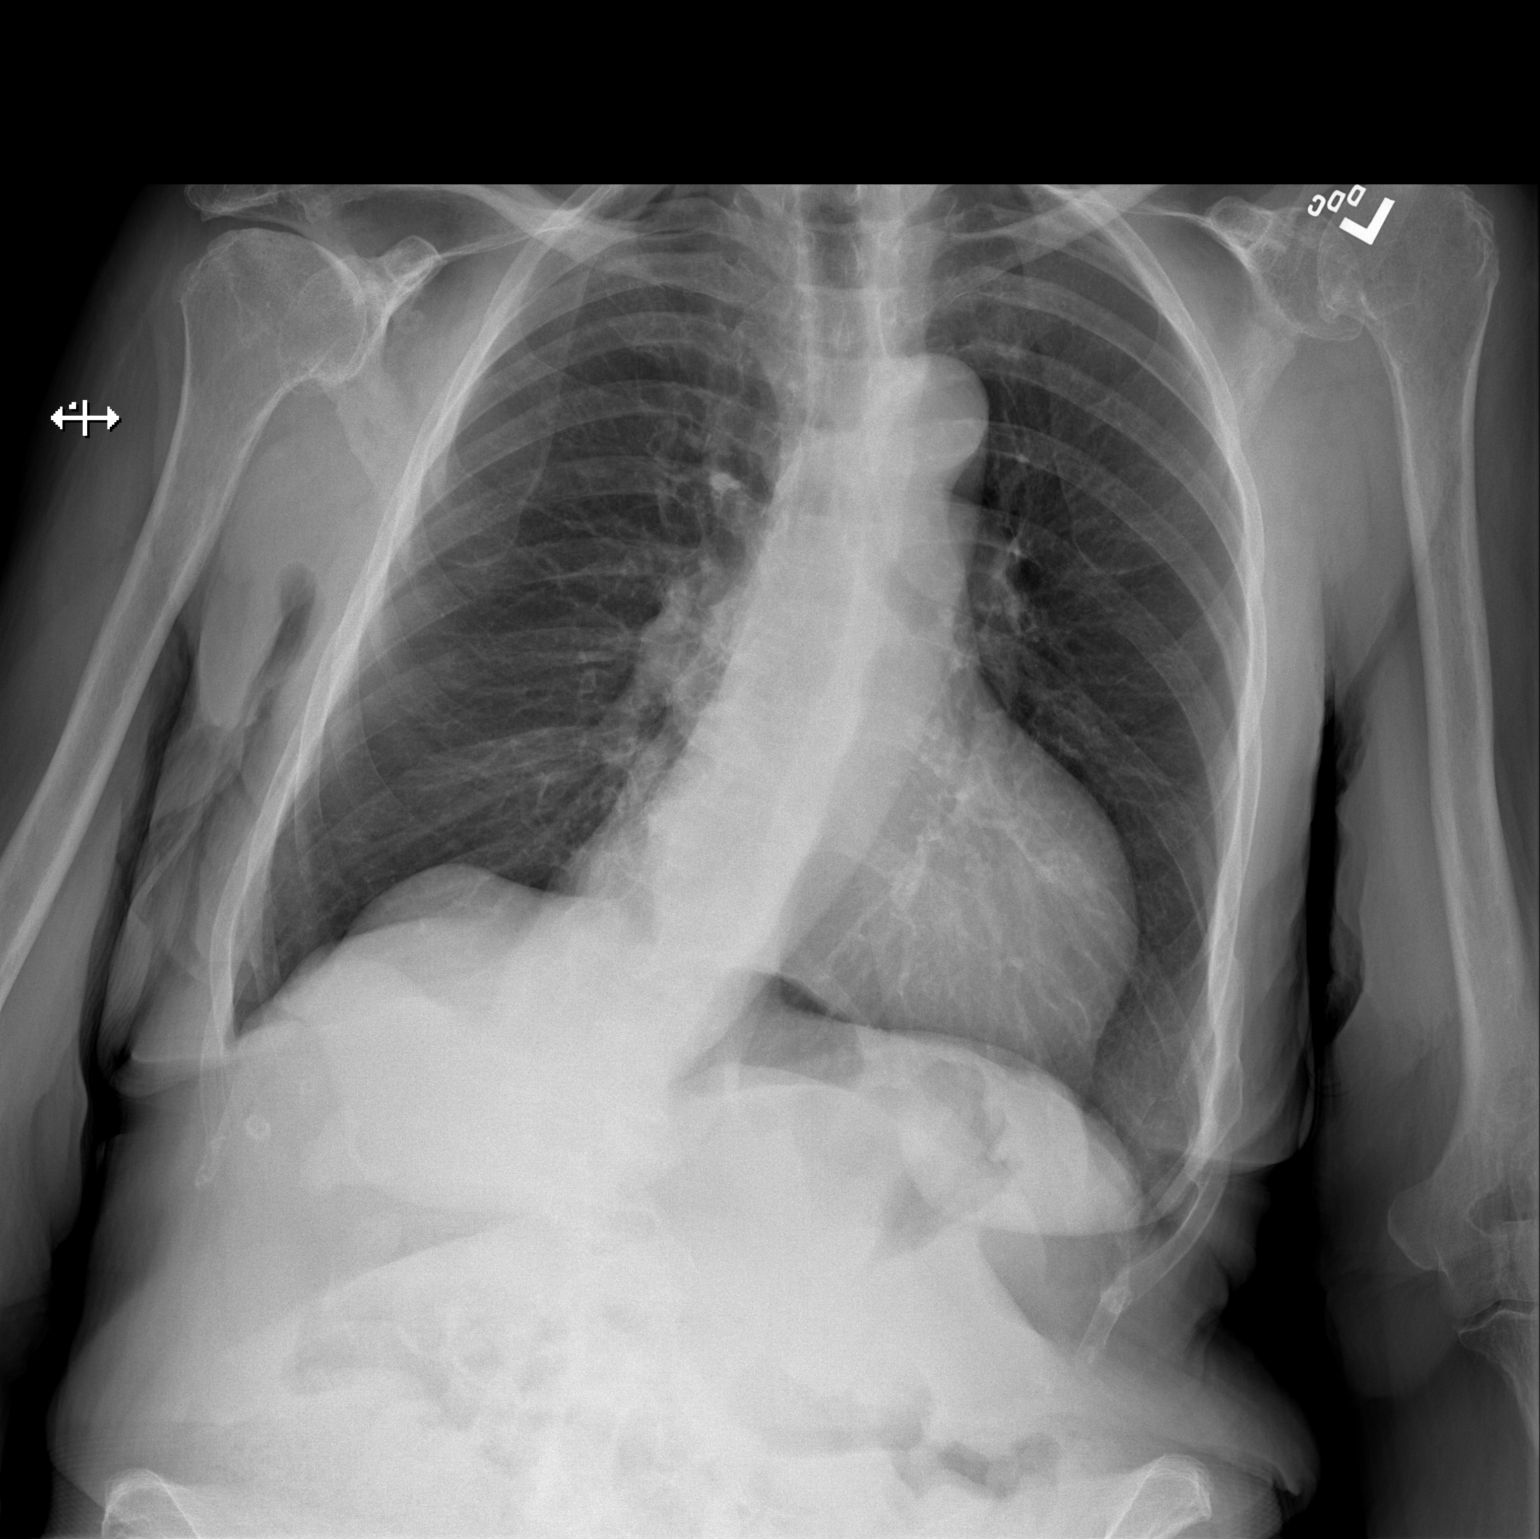
[im 2/2]
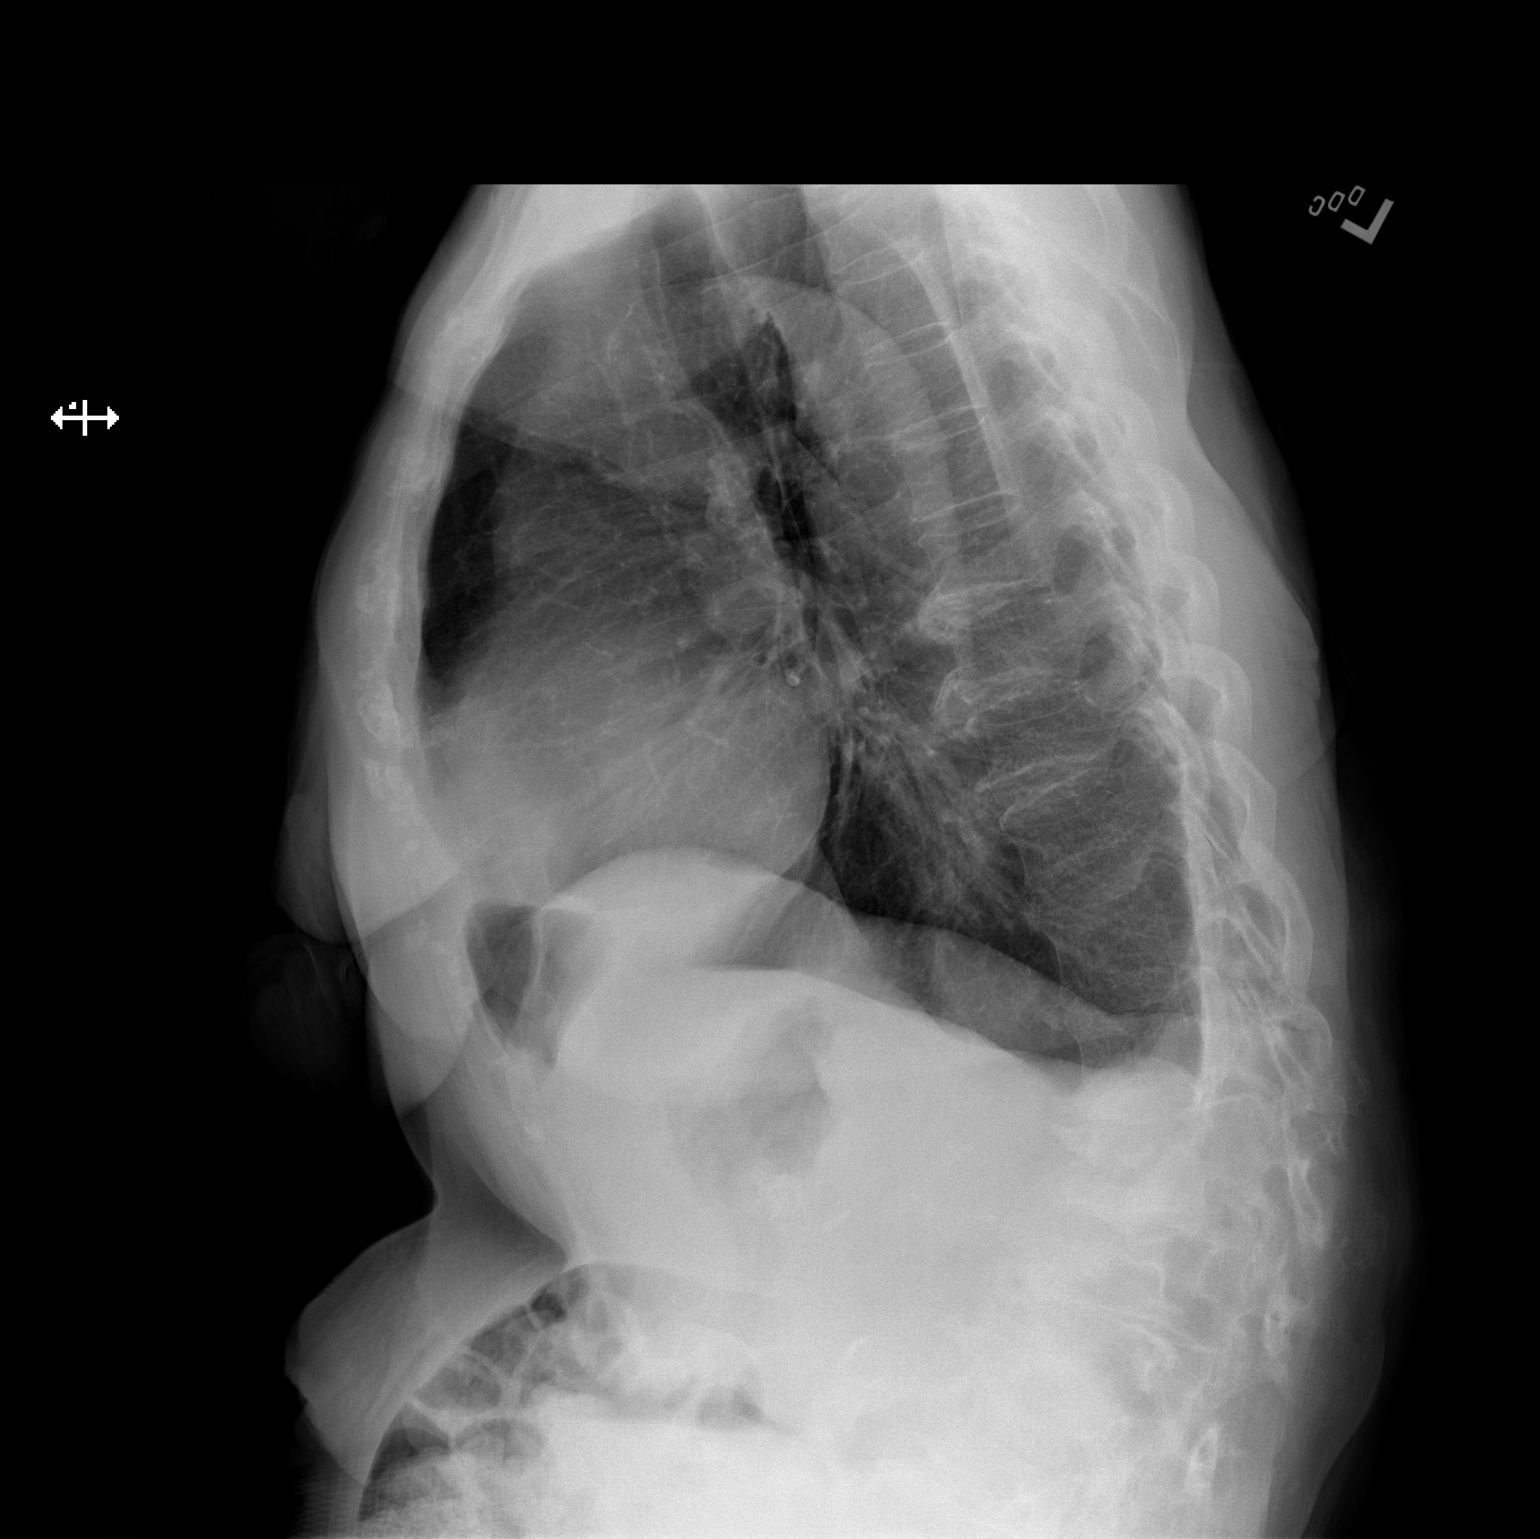

[2 of 2 positions shown; findings below may reference images not displayed]

FINDINGS: The lungs are well-aerated and clear. There is no evidence of focal
opacification, pleural effusion or pneumothorax.

The heart is normal in size; the mediastinal contour is within
normal limits. No acute osseous abnormalities are seen. Right convex
thoracolumbar scoliosis is again noted. Mild chronic left-sided rib
deformities are noted. Clips are noted within the right upper
quadrant, reflecting prior cholecystectomy.
IMPRESSION: 1. No acute cardiopulmonary process seen.
2. Right convex thoracolumbar scoliosis noted.

## 2018-02-22 ENCOUNTER — Telehealth: Payer: Self-pay

## 2018-02-22 NOTE — Telephone Encounter (Signed)
Pt informed

## 2018-02-22 NOTE — Telephone Encounter (Signed)
-----   Message from Nori Riis, PA-C sent at 02/21/2018 11:54 AM EDT ----- Please let Mrs. Riesen know that her lab work is stable.

## 2018-02-23 ENCOUNTER — Encounter: Payer: Self-pay | Admitting: Podiatry

## 2018-02-23 ENCOUNTER — Ambulatory Visit: Payer: Medicare HMO | Admitting: Podiatry

## 2018-02-23 DIAGNOSIS — Q828 Other specified congenital malformations of skin: Secondary | ICD-10-CM

## 2018-02-23 DIAGNOSIS — M216X2 Other acquired deformities of left foot: Secondary | ICD-10-CM

## 2018-02-23 DIAGNOSIS — M216X1 Other acquired deformities of right foot: Secondary | ICD-10-CM

## 2018-02-23 NOTE — Progress Notes (Signed)
This patient presents the office with chief complaint of a painful callus under the outside ball of both feet. She says the calluses have become very painful  walking and wearing her shoes.     She presents the office today for an evaluation and treatment of her painful calluses both feet.  Patient is taking Eliquiss.  GENERAL APPEARANCE: Alert, conversant. Appropriately groomed. No acute distress.  VASCULAR: Pedal pulses are  palpable at  [facility] and PT bilateral.  Capillary refill time is immediate to all digits,  Normal temperature gradient.   NEUROLOGIC: sensation is normal to 5.07 monofilament at 5/5 sites bilateral.  Light touch is intact bilateral, Muscle strength normal.  MUSCULOSKELETAL: acceptable muscle strength, tone and stability bilateral.  Intrinsic muscluature intact bilateral.  Plantarflexed fifth metatarsal both feet.  DERMATOLOGIC: skin color, texture, and turgor are within normal limits.  No preulcerative lesions or ulcers  are seen, no interdigital maceration noted.  No open lesions present.  Digital nails are asymptomatic. No drainage noted.   Painful callus sub 5th met left foot.   Porokeratosis  B/L    Plantarflexed 5th met B/L.  ROV  Debridement of porokeratosis both feet   Return to clinic 10 weeks..  Padding dispensed. Patient has been dispensed padding  Suggested she make an appointment with Rick for dispersion padding for plantar flexed fifth metatarsals  B/L.     DPM . 

## 2018-03-01 ENCOUNTER — Ambulatory Visit: Payer: Medicare HMO | Admitting: Orthotics

## 2018-03-01 DIAGNOSIS — M216X1 Other acquired deformities of right foot: Secondary | ICD-10-CM

## 2018-03-01 DIAGNOSIS — M216X2 Other acquired deformities of left foot: Secondary | ICD-10-CM

## 2018-03-01 DIAGNOSIS — Q828 Other specified congenital malformations of skin: Secondary | ICD-10-CM

## 2018-03-01 NOTE — Progress Notes (Signed)
Patient was concerned about cost of f/o to disperse painful keratomas; I modified an OTS insert for her to see if it offered relief. It did and she is making an appointment for 3 weeks to make custom f/o to address also her high arch.

## 2018-03-02 ENCOUNTER — Ambulatory Visit: Payer: Medicare HMO | Admitting: Podiatry

## 2018-03-24 ENCOUNTER — Ambulatory Visit (INDEPENDENT_AMBULATORY_CARE_PROVIDER_SITE_OTHER): Payer: Self-pay | Admitting: Orthotics

## 2018-03-24 DIAGNOSIS — Q828 Other specified congenital malformations of skin: Secondary | ICD-10-CM

## 2018-03-24 DIAGNOSIS — M216X1 Other acquired deformities of right foot: Secondary | ICD-10-CM

## 2018-03-24 DIAGNOSIS — M779 Enthesopathy, unspecified: Secondary | ICD-10-CM

## 2018-03-24 NOTE — Progress Notes (Signed)
Patient came into today to be cast for Custom Foot Orthotics. Upon recommendation of Dr. Prudence Davidson Patient presents with painful callus/thin fat pad Goals are forefoot cushioning, offloanding 1/5 mets b/l Plan vendor

## 2018-05-01 DIAGNOSIS — M12812 Other specific arthropathies, not elsewhere classified, left shoulder: Secondary | ICD-10-CM | POA: Insufficient documentation

## 2018-05-01 DIAGNOSIS — M75122 Complete rotator cuff tear or rupture of left shoulder, not specified as traumatic: Secondary | ICD-10-CM | POA: Insufficient documentation

## 2018-05-03 ENCOUNTER — Ambulatory Visit: Payer: Medicare HMO | Admitting: Orthotics

## 2018-05-03 DIAGNOSIS — M216X1 Other acquired deformities of right foot: Secondary | ICD-10-CM

## 2018-05-03 DIAGNOSIS — M216X2 Other acquired deformities of left foot: Secondary | ICD-10-CM

## 2018-05-03 NOTE — Progress Notes (Signed)
Patient came in today to pick up custom made foot orthotics.  The goals were accomplished and the patient reported no dissatisfaction with said orthotics.  Patient was advised of breakin period and how to report any issues. 

## 2018-05-25 ENCOUNTER — Ambulatory Visit: Payer: Medicare HMO | Admitting: Podiatry

## 2018-06-19 ENCOUNTER — Encounter: Payer: Self-pay | Admitting: Podiatry

## 2018-06-19 ENCOUNTER — Ambulatory Visit: Payer: Medicare HMO | Admitting: Podiatry

## 2018-06-19 DIAGNOSIS — Q828 Other specified congenital malformations of skin: Secondary | ICD-10-CM

## 2018-06-19 DIAGNOSIS — D689 Coagulation defect, unspecified: Secondary | ICD-10-CM | POA: Diagnosis not present

## 2018-06-19 DIAGNOSIS — M216X1 Other acquired deformities of right foot: Secondary | ICD-10-CM

## 2018-06-19 NOTE — Progress Notes (Signed)
This patient presents the office with chief complaint of a painful callus under the outside ball of both feet. She says the calluses have become very painful  walking and wearing her shoes.     She presents the office today for an evaluation and treatment of her painful calluses both feet.  Patient is taking Eliquiss.  GENERAL APPEARANCE: Alert, conversant. Appropriately groomed. No acute distress.  VASCULAR: Pedal pulses are  palpable at  [facility] and PT bilateral.  Capillary refill time is immediate to all digits,  Normal temperature gradient.   NEUROLOGIC: sensation is normal to 5.07 monofilament at 5/5 sites bilateral.  Light touch is intact bilateral, Muscle strength normal.  MUSCULOSKELETAL: acceptable muscle strength, tone and stability bilateral.  Intrinsic muscluature intact bilateral.  Plantarflexed fifth metatarsal both feet.  DERMATOLOGIC: skin color, texture, and turgor are within normal limits.  No preulcerative lesions or ulcers  are seen, no interdigital maceration noted.  No open lesions present.  Digital nails are asymptomatic. No drainage noted.   Painful callus sub 5th met left foot.   Porokeratosis  B/L    Plantarflexed 5th met B/L.  ROV  Debridement of porokeratosis both feet   Return to clinic 10 weeks..  Padding dispensed. Patient has been dispensed padding     Gardiner Barefoot DPM .

## 2018-07-28 ENCOUNTER — Other Ambulatory Visit: Payer: Self-pay | Admitting: Family Medicine

## 2018-08-04 DIAGNOSIS — I493 Ventricular premature depolarization: Secondary | ICD-10-CM | POA: Insufficient documentation

## 2018-08-14 ENCOUNTER — Ambulatory Visit: Payer: Medicare HMO | Admitting: Urology

## 2018-08-16 ENCOUNTER — Encounter: Payer: Self-pay | Admitting: Urology

## 2018-08-16 ENCOUNTER — Ambulatory Visit: Payer: Medicare HMO | Admitting: Urology

## 2018-08-16 VITALS — BP 148/69 | HR 67 | Resp 15 | Ht 62.0 in | Wt 140.0 lb

## 2018-08-16 DIAGNOSIS — N200 Calculus of kidney: Secondary | ICD-10-CM | POA: Diagnosis not present

## 2018-08-16 DIAGNOSIS — N3946 Mixed incontinence: Secondary | ICD-10-CM

## 2018-08-16 DIAGNOSIS — R310 Gross hematuria: Secondary | ICD-10-CM | POA: Diagnosis not present

## 2018-08-16 MED ORDER — MIRABEGRON ER 50 MG PO TB24: 50.0000 mg | ORAL_TABLET | Freq: Every day | ORAL | 0 refills | Status: DC

## 2018-08-16 NOTE — Progress Notes (Signed)
08/16/2018 3:35 PM   Regina Mathis 26-Jul-1941 [id number]  Referring provider: Casilda Carls, MD [address] [place], [place] [address]  Chief Complaint  Patient presents with  . Follow-up    HPI: 77 yo WF with severe scoliosis, dementia, CHF, a staghorn calculus and gross hematuria who presents today for a six month follow up with her sister, Regina Mathis.    Staghorn calculus/Gross hematuria Review of her medical record show that she underwent a CT scan ordered by her previous PCP on 08/27/2016 for further evaluation of chronic bladder infections and microscopic hematuria.  This showed a large right sided staghorn calculus measuring 3.7 x 1.3 x 1.1 in the upper pole and 3.1 x 1.4 x 1.5 in the lower pole.  She did have a fullness of her collecting system associated with a staghorn.  She also had incidental finding of abnormal endometrial thickening in this problem seemed to take precedence.  She since undergone further evaluation and treatment with Dr. Larey Days.  Additionally, review of records reveal evidence of chronic asymptomatic bacteriuria.  CTU performed on 06/06/2017 noted nonobstructive right renal staghorn calculi.  No additional findings to explain patient's  Hematuria.  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  Aortic atherosclerosis.  Cystoscopy was unremarkable on 06/14/2017.   Patient did not have a KUB today as she got lost on the way to the office.  She will have a KUB prior to her appointment in 3 weeks.  See incontinence.  Mixed incontinence Today, she is complaining of nocturia, incontinence and gross hematuria.  She is also having fecal incontinence.  Patient denies any gross hematuria, dysuria or suprapubic/flank pain.  Patient denies any fevers, chills, nausea or vomiting.   Her UA today is positive for 11-30 WBC's, > 30 RBC's and many bacteria.  Her BP is 148/69.   She is currently on Myrbetriq 25 mg daily.  She  states she is still having significant incontinence with stress and urge.  She does have baseline severe scoliosis. Additionally, she is a Restaurant manager, fast food.   PMH: Past Medical History:  Diagnosis Date  . GERD (gastroesophageal reflux disease)   . Hyperlipidemia   . Hypertension   . Osteoarthritis   . Pre-diabetes   . Scoliosis deformity of spine   . Valvular heart disease    Echocardiogram 2014- moderate mitral regurg, moderate tricuspid regurg, moderate dilated left atrium and right atrium, EF 50-55%    Surgical History: Past Surgical History:  Procedure Laterality Date  . CATARACT EXTRACTION W/ INTRAOCULAR LENS  IMPLANT, BILATERAL    . CHOLECYSTECTOMY    . COLONOSCOPY WITH PROPOFOL N/A 01/16/2016   Procedure: COLONOSCOPY WITH PROPOFOL;  Surgeon: [name], MD;  Location: [facility] ENDOSCOPY;  Service: Endoscopy;  Laterality: N/A;  . HERNIA REPAIR     with Bowel Involvement (04/2012)  . HYSTEROSCOPY W/D&C N/A 01/14/2017   Procedure: DILATATION AND CURETTAGE /HYSTEROSCOPY;  Surgeon: Honor Loh Ward, MD;  Location: [facility] ORS;  Service: Gynecology;  Laterality: N/A;  . 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 Lumpectomy Chest Area    . TOTAL KNEE ARTHROPLASTY Bilateral     Home Medications:  Allergies as of 08/16/2018      Reactions   Other       Medication List        Accurate as of 08/16/18  3:35 PM. Always use your most recent med list.  apixaban 5 MG Tabs tablet Commonly known as:  ELIQUIS Take by mouth 2 (two) times daily.   ciprofloxacin 500 MG tablet Commonly known as:  CIPRO ciprofloxacin 500 mg tablet   donepezil 10 MG tablet Commonly known as:  ARICEPT TAKE 1 TABLET BY MOUTH ONCE DAILY AT NIGHT   furosemide 20 MG tablet Commonly known as:  LASIX furosemide 20 mg tablet   lisinopril-hydrochlorothiazide 20-25 MG tablet Commonly known as:  PRINZIDE,ZESTORETIC lisinopril 20 mg-hydrochlorothiazide 25  mg tablet   memantine 5 MG tablet Commonly known as:  NAMENDA   mirabegron ER 50 MG Tb24 tablet Commonly known as:  MYRBETRIQ Take 1 tablet (50 mg total) by mouth daily.   NORCO 5-325 MG tablet Generic drug:  HYDROcodone-acetaminophen Norco 5 mg-325 mg tablet  Take 1- 2  tablet(s) EVERY 4 - 6 HOURS by oral route. PRN PAIN   PROBIOTIC-10 Chew Chew 1 tablet by mouth 2 (two) times daily.   ranitidine 150 MG tablet Commonly known as:  ZANTAC Take 150 mg by mouth at bedtime.   vitamin C 1000 MG tablet Take 1,000 mg by mouth daily.   Vitamin D3 50 MCG (2000 UT) Tabs Take 2,000 Units by mouth daily.       Allergies:  Allergies  Allergen Reactions  . Other     Family History: Family History  Problem Relation Age of Onset  . CAD Mother   . Diabetes Mellitus II Mother   . Stroke Mother   . Lung cancer Father   . Diabetes Mellitus II Sister   . Diabetes Mellitus II Brother   . Breast cancer Neg Hx   . Bladder Cancer Neg Hx   . Kidney cancer Neg Hx     Social History:  reports that she has never smoked. She has never used smokeless tobacco. She reports that she does not drink alcohol or use drugs.  ROS: UROLOGY Frequent Urination?: No Hard to postpone urination?: No Burning/pain with urination?: No Get up at night to urinate?: Yes Leakage of urine?: Yes Urine stream starts and stops?: No Trouble starting stream?: No Do you have to strain to urinate?: No Blood in urine?: Yes Urinary tract infection?: No Sexually transmitted disease?: No Injury to kidneys or bladder?: No Painful intercourse?: No Weak stream?: No Currently pregnant?: No Vaginal bleeding?: No Last menstrual period?: n  Gastrointestinal Nausea?: No Vomiting?: No Indigestion/heartburn?: Yes Diarrhea?: No Constipation?: No  Constitutional Fever: No Night sweats?: No Weight loss?: No Fatigue?: No  Skin Skin rash/lesions?: No Itching?: No  Eyes Blurred vision?: No Double vision?:  No  Ears/Nose/Throat Sore throat?: No Sinus problems?: No  Hematologic/Lymphatic Swollen glands?: No Easy bruising?: Yes  Cardiovascular Leg swelling?: No Chest pain?: Yes  Respiratory Cough?: No Shortness of breath?: No  Endocrine Excessive thirst?: No  Musculoskeletal Back pain?: Yes Joint pain?: Yes  Neurological Headaches?: No Dizziness?: No  Psychologic Depression?: No Anxiety?: No  Physical Exam: BP (!) 148/69   Pulse 67   Resp 15   Ht 5\' 2"  (1.575 m)   Wt 140 lb (63.5 kg)   BMI 25.61 kg/m   Constitutional: Well nourished. Alert and oriented, No acute distress. HEENT: [place] AT, moist mucus membranes. Trachea midline, no masses. Cardiovascular: No clubbing, cyanosis, or edema. Respiratory: Normal respiratory effort, no increased work of breathing. Skin: No rashes, bruises or suspicious lesions. Neurologic: Grossly intact, no focal deficits, moving all 4 extremities. Psychiatric: Normal mood and affect.  Laboratory Data: Lab Results  Component Value Date  WBC 5.1 02/09/2018   HGB 13.0 02/09/2018   HCT 38.8 02/09/2018   MCV 89 02/09/2018   PLT 215 02/09/2018    Lab Results  Component Value Date   CREATININE 0.91 02/09/2018   I have reviewed the labs  Assessment & Plan:    1. History of hematuria Likely secondary to large staghorn in the setting of anticoagulation CBC drawn today Warning symptoms reviewed RTC pending lab results   2. Staghorn calculus Large right staghorn calculus  No dramatic change in stone size since 08/2016 compared to most recent CT urogram on 06/07/2017, no obvious hydronephrosis or obstruction Complex surgical patient insetting of anticoagulation, severe scoliosis, and Jehovah's Witness She is not desiring any surgical procedure at this time We'll continue to follow conservatively- warning symptoms reviewed BMP drawn today RTC pending lab results RTC in 3 weeks for KUB  3. Mixed incontinence We will increase  the patient's Myrbetriq to 50 mg daily, #28 samples are given She will return in 3 weeks for OAB questionnaire and PVR  Return in about 3 weeks (around 09/06/2018) for PVR and OAB questionnaire.  Zara Council, PA-C  [facility] [address], [place], [place] [address] [phone]

## 2018-08-17 ENCOUNTER — Telehealth: Payer: Self-pay

## 2018-08-17 LAB — CBC
HEMOGLOBIN: 13.2 g/dL (ref 11.1–15.9)
Hematocrit: 39.5 % (ref 34.0–46.6)
MCH: 30.1 pg (ref 26.6–33.0)
MCHC: 33.4 g/dL (ref 31.5–35.7)
MCV: 90 fL (ref 79–97)
PLATELETS: 210 10*3/uL (ref 150–450)
RBC: 4.39 x10E6/uL (ref 3.77–5.28)
RDW: 12.6 % (ref 12.3–15.4)
WBC: 5 10*3/uL (ref 3.4–10.8)

## 2018-08-17 LAB — BASIC METABOLIC PANEL
BUN/Creatinine Ratio: 18 (ref 12–28)
BUN: 21 mg/dL (ref 8–27)
CO2: 25 mmol/L (ref 20–29)
Calcium: 9.9 mg/dL (ref 8.7–10.3)
Chloride: 100 mmol/L (ref 96–106)
Creatinine, Ser: 1.14 mg/dL — ABNORMAL HIGH (ref 0.57–1.00)
GFR calc Af Amer: 54 mL/min/{1.73_m2} — ABNORMAL LOW (ref >59)
GFR, EST NON AFRICAN AMERICAN: 47 mL/min/{1.73_m2} — AB (ref >59)
GLUCOSE: 78 mg/dL (ref 65–99)
POTASSIUM: 3.8 mmol/L (ref 3.5–5.2)
SODIUM: 143 mmol/L (ref 134–144)

## 2018-08-17 LAB — URINALYSIS, COMPLETE
BILIRUBIN UA: NEGATIVE
GLUCOSE, UA: NEGATIVE
KETONES UA: NEGATIVE
NITRITE UA: NEGATIVE
SPEC GRAV UA: 1.02 (ref 1.005–1.030)
UUROB: 0.2 mg/dL (ref 0.2–1.0)
pH, UA: 7 (ref 5.0–7.5)

## 2018-08-17 LAB — MICROSCOPIC EXAMINATION: EPITHELIAL CELLS (NON RENAL): NONE SEEN /HPF (ref 0–10)

## 2018-08-17 NOTE — Telephone Encounter (Signed)
-----   Message from Nori Riis, PA-C sent at 08/17/2018  7:49 AM EST ----- Please let Regina Mathis know that her kidney function is a little worse.  I would like her to have a CT Renal stone study at this time.

## 2018-08-17 NOTE — Telephone Encounter (Signed)
Pt informed, please arrange CT scan.

## 2018-08-20 LAB — CULTURE, URINE COMPREHENSIVE

## 2018-08-21 ENCOUNTER — Other Ambulatory Visit: Payer: Self-pay | Admitting: Urology

## 2018-08-21 DIAGNOSIS — N200 Calculus of kidney: Secondary | ICD-10-CM

## 2018-08-21 NOTE — Progress Notes (Signed)
Orders are in for CT Renal stone study.

## 2018-08-29 ENCOUNTER — Encounter: Payer: Self-pay | Admitting: *Deleted

## 2018-09-01 ENCOUNTER — Ambulatory Visit
Admission: RE | Admit: 2018-09-01 | Discharge: 2018-09-01 | Disposition: A | Discharge status: Home / Self Care | Payer: Medicare HMO | Source: Ambulatory Visit | Attending: Urology | Admitting: Urology

## 2018-09-01 DIAGNOSIS — N200 Calculus of kidney: Secondary | ICD-10-CM

## 2018-09-06 ENCOUNTER — Encounter: Payer: Self-pay | Admitting: Urology

## 2018-09-06 ENCOUNTER — Ambulatory Visit: Payer: Medicare HMO | Admitting: Urology

## 2018-09-06 VITALS — BP 151/72 | HR 62 | Ht 62.0 in | Wt 144.0 lb

## 2018-09-06 DIAGNOSIS — Z87448 Personal history of other diseases of urinary system: Secondary | ICD-10-CM | POA: Diagnosis not present

## 2018-09-06 DIAGNOSIS — N200 Calculus of kidney: Secondary | ICD-10-CM | POA: Diagnosis not present

## 2018-09-06 DIAGNOSIS — N3946 Mixed incontinence: Secondary | ICD-10-CM

## 2018-09-06 LAB — BLADDER SCAN AMB NON-IMAGING

## 2018-09-06 MED ORDER — MIRABEGRON ER 25 MG PO TB24: 25.0000 mg | ORAL_TABLET | Freq: Every day | ORAL | 3 refills | Status: DC

## 2018-09-06 MED ORDER — MIRABEGRON ER 50 MG PO TB24: 50.0000 mg | ORAL_TABLET | Freq: Every day | ORAL | 0 refills | Status: DC

## 2018-09-06 NOTE — Progress Notes (Signed)
09/06/2018 4:59 PM   Regina Mathis 10-Jul-1941 [id number]  Referring provider: Casilda Carls, MD [address] [place], [place] [address]  Chief Complaint  Patient presents with  . Follow-up    HPI: 77 yo WF with severe scoliosis, dementia, CHF, a staghorn calculus and gross hematuria who presents today for a six month follow up with her sister, Regina Mathis.    Staghorn calculus/Gross hematuria Review of her medical record show that she underwent a CT scan ordered by her previous PCP on 08/27/2016 for further evaluation of chronic bladder infections and microscopic hematuria.  This showed a large right sided staghorn calculus measuring 3.7 x 1.3 x 1.1 in the upper pole and 3.1 x 1.4 x 1.5 in the lower pole.  She did have a fullness of her collecting system associated with a staghorn.  She also had incidental finding of abnormal endometrial thickening in this problem seemed to take precedence.  She since undergone further evaluation and treatment with Dr. Larey Mathis.  Additionally, review of records reveal evidence of chronic asymptomatic bacteriuria.  CTU performed on 06/06/2017 noted nonobstructive right renal staghorn calculi.  No additional findings to explain patient's  Hematuria.  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  Aortic atherosclerosis.  Cystoscopy was unremarkable on 06/14/2017.   CT Renal stone study on 09/01/2018 revealed staghorn calculus in the right renal collecting system shows no significant change. No evidence of ureteral calculi or hydronephrosis. Unremarkable unopacified urinary bladder.  Mixed incontinence Today, she is complaining of nocturia, incontinence and gross hematuria.  She is also having fecal incontinence.  Patient denies any gross hematuria, dysuria or suprapubic/flank pain.  Patient denies any fevers, chills, nausea or vomiting.  Her BP is 151/72.   She is currently on Myrbetriq 25 mg daily.  She states  she feels the Myrbetriq has improved her symptoms and would like to continue.  The patient is  experiencing urgency x 0-3, frequency x 0-3, not restricting fluids to avoid visits to the restroom, is engaging in toilet mapping, incontinence x 0-3 and nocturia x 0-3.  Her PVR is 16 mL.     She does have baseline severe scoliosis. Additionally, she is a Restaurant manager, fast food.   PMH: Past Medical History:  Diagnosis Date  . GERD (gastroesophageal reflux disease)   . Hyperlipidemia   . Hypertension   . Osteoarthritis   . Pre-diabetes   . Scoliosis deformity of spine   . Valvular heart disease    Echocardiogram 2014- moderate mitral regurg, moderate tricuspid regurg, moderate dilated left atrium and right atrium, EF 50-55%    Surgical History: Past Surgical History:  Procedure Laterality Date  . CATARACT EXTRACTION W/ INTRAOCULAR LENS  IMPLANT, BILATERAL    . CHOLECYSTECTOMY    . COLONOSCOPY WITH PROPOFOL N/A 01/16/2016   Procedure: COLONOSCOPY WITH PROPOFOL;  Surgeon: Regina Class, MD;  Location: [facility] ENDOSCOPY;  Service: Endoscopy;  Laterality: N/A;  . HERNIA REPAIR     with Bowel Involvement (04/2012)  . HYSTEROSCOPY W/D&C N/A 01/14/2017   Procedure: DILATATION AND CURETTAGE /HYSTEROSCOPY;  Surgeon: Regina Loh Ward, MD;  Location: [facility] ORS;  Service: Gynecology;  Laterality: N/A;  . 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 Lumpectomy Chest Area    . TOTAL KNEE ARTHROPLASTY Bilateral     Home Medications:  Allergies as of 09/06/2018      Reactions   Other  Medication List        Accurate as of 09/06/18  4:59 PM. Always use your most recent med list.          apixaban 5 MG Tabs tablet Commonly known as:  ELIQUIS Take by mouth 2 (two) times daily.   ciprofloxacin 500 MG tablet Commonly known as:  CIPRO ciprofloxacin 500 mg tablet   donepezil 10 MG tablet Commonly known as:  ARICEPT TAKE 1 TABLET BY MOUTH ONCE DAILY AT  NIGHT   furosemide 20 MG tablet Commonly known as:  LASIX furosemide 20 mg tablet   lisinopril-hydrochlorothiazide 20-25 MG tablet Commonly known as:  PRINZIDE,ZESTORETIC lisinopril 20 mg-hydrochlorothiazide 25 mg tablet   memantine 5 MG tablet Commonly known as:  NAMENDA   mirabegron ER 25 MG Tb24 tablet Commonly known as:  MYRBETRIQ Take 1 tablet (25 mg total) by mouth daily.   NORCO 5-325 MG tablet Generic drug:  HYDROcodone-acetaminophen Norco 5 mg-325 mg tablet  Take 1- 2  tablet(s) EVERY 4 - 6 HOURS by oral route. PRN PAIN   PROBIOTIC-10 Chew Chew 1 tablet by mouth 2 (two) times daily.   ranitidine 150 MG tablet Commonly known as:  ZANTAC Take 150 mg by mouth at bedtime.   vitamin C 1000 MG tablet Take 1,000 mg by mouth daily.   Vitamin D3 50 MCG (2000 UT) Tabs Take 2,000 Units by mouth daily.       Allergies:  Allergies  Allergen Reactions  . Other     Family History: Family History  Problem Relation Age of Onset  . CAD Mother   . Diabetes Mellitus II Mother   . Stroke Mother   . Lung cancer Father   . Diabetes Mellitus II Sister   . Diabetes Mellitus II Brother   . Breast cancer Neg Hx   . Bladder Cancer Neg Hx   . Kidney cancer Neg Hx     Social History:  reports that she has never smoked. She has never used smokeless tobacco. She reports that she does not drink alcohol or use drugs.  ROS: UROLOGY Frequent Urination?: Yes Hard to postpone urination?: No Burning/pain with urination?: No Get up at night to urinate?: Yes Leakage of urine?: No Urine stream starts and stops?: No Trouble starting stream?: No Do you have to strain to urinate?: No Blood in urine?: Yes Urinary tract infection?: No Sexually transmitted disease?: No Injury to kidneys or bladder?: No Painful intercourse?: No Weak stream?: No Currently pregnant?: No Vaginal bleeding?: No Last menstrual period?: n  Gastrointestinal Nausea?: No Vomiting?:  No Indigestion/heartburn?: Yes Diarrhea?: No Constipation?: No  Constitutional Fever: No Night sweats?: No Weight loss?: No Fatigue?: No  Skin Skin rash/lesions?: No Itching?: Yes  Eyes Blurred vision?: No Double vision?: No  Ears/Nose/Throat Sore throat?: No Sinus problems?: No  Hematologic/Lymphatic Swollen glands?: No Easy bruising?: Yes  Cardiovascular Leg swelling?: No Chest pain?: No  Respiratory Cough?: No Shortness of breath?: No  Endocrine Excessive thirst?: No  Musculoskeletal Back pain?: Yes Joint pain?: Yes  Neurological Headaches?: No Dizziness?: No  Psychologic Depression?: No Anxiety?: No  Physical Exam: BP (!) 151/72   Pulse 62   Ht 5\' 2"  (1.575 m)   Wt 144 lb (65.3 kg)   BMI 26.34 kg/m   Constitutional:  Well nourished. Alert and oriented, No acute distress. HEENT: [place] AT, moist mucus membranes.  Trachea midline, no masses. Cardiovascular: No clubbing, cyanosis, or edema. Respiratory: Normal respiratory effort, no increased work of breathing. Skin: No  rashes, bruises or suspicious lesions. Neurologic: Grossly intact, no focal deficits, moving all 4 extremities. Psychiatric: Normal mood and affect.   Laboratory Data: Lab Results  Component Value Date   WBC 5.0 08/16/2018   HGB 13.2 08/16/2018   HCT 39.5 08/16/2018   MCV 90 08/16/2018   PLT 210 08/16/2018    Lab Results  Component Value Date   CREATININE 1.14 (H) 08/16/2018   I have reviewed the labs  Assessment & Plan:    1. History of hematuria Likely secondary to large staghorn in the setting of anticoagulation CBC stable  Warning symptoms reviewed  2. Staghorn calculus Large right staghorn calculus  No dramatic change in stone size since 08/2016 compared to most recent CT urogram on 06/07/2017 and CT renal stone study on 09/01/2018 no obvious hydronephrosis or obstruction Complex surgical patient insetting of anticoagulation, severe scoliosis, and Jehovah's  Witness She is not desiring any surgical procedure at this time We'll continue to follow conservatively- warning symptoms reviewed Creatinine is up to 1.14 from 0.91, but the stone is stable and she is not wanting to have any procedure at this time and given her co morbidities this is appropriate  3. Mixed incontinence Patient's insurance will cover the 25 mg of Myrbetriq at an affordable price She will continue this dose and RTC in three months for OAB questionnaire and PVR   Return in about 3 months (around 12/06/2018) for PVR and OAB questionnaire.  Zara Council, PA-C  [facility] [address] [place], [place] [address] [phone]

## 2018-09-08 IMAGING — CT CT ABD-PEL WO/W CM
2 of 10 series · 10 of 46 positions shown, 16 images · IV contrast (APPLIED)
Comparison: 08/27/2016

CLINICAL DATA: Low back pain.  Intermittent hematuria.

EXAM:
CT ABDOMEN AND PELVIS WITHOUT AND WITH CONTRAST
TECHNIQUE: Multidetector CT imaging of the abdomen and pelvis was performed
following the standard protocol before and following the bolus
administration of intravenous contrast.
CONTRAST:  125mL YVM5NA-GHH IOPAMIDOL (YVM5NA-GHH) INJECTION 61%

[Series 2: axial pre · axial · non-contrast · 0.70mm/px · z∈[-396,-56]mm · 8 of 88 slices shown, 13 images]
[im 10/88  soft-tissue]
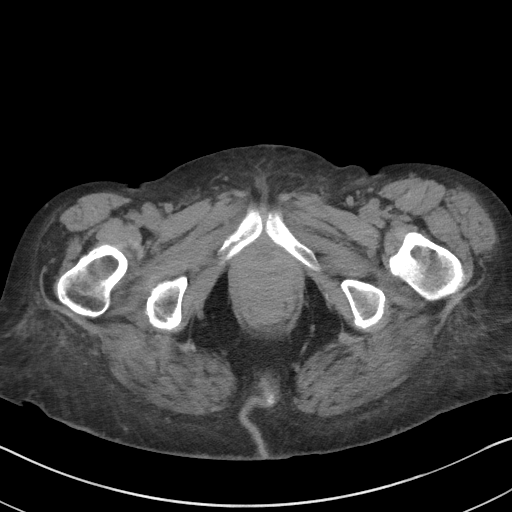
[im 10/88  bone]
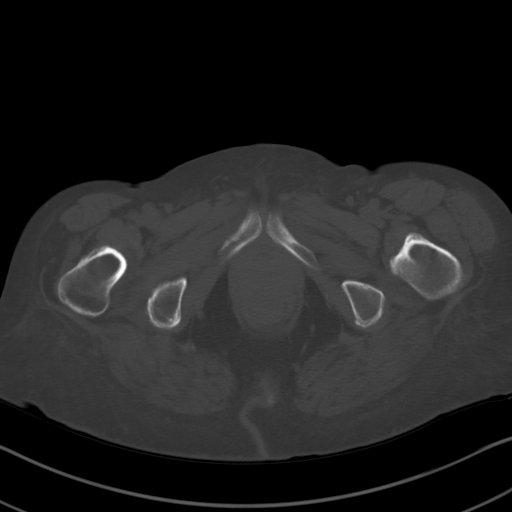
[im 20/88  soft-tissue]
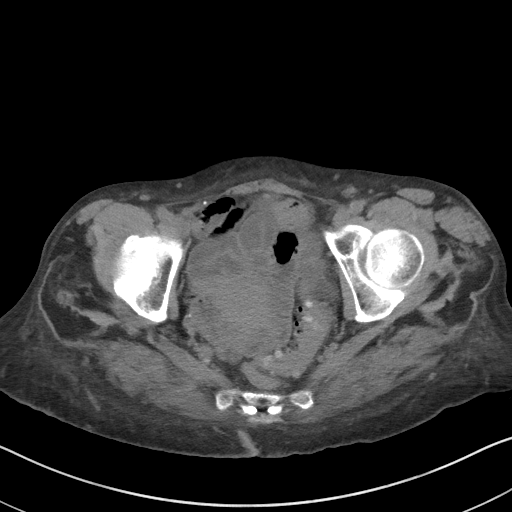
[im 30/88  soft-tissue]
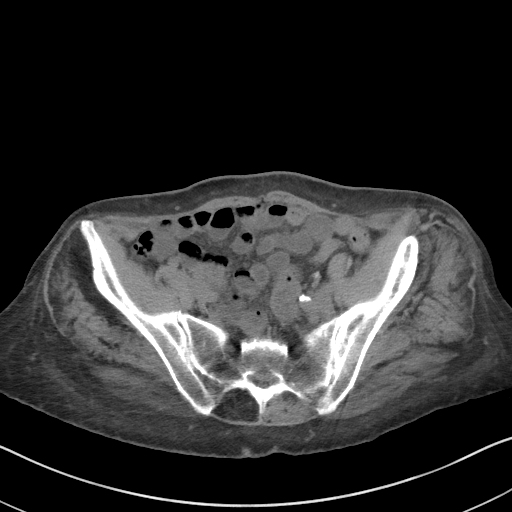
[im 39/88  soft-tissue]
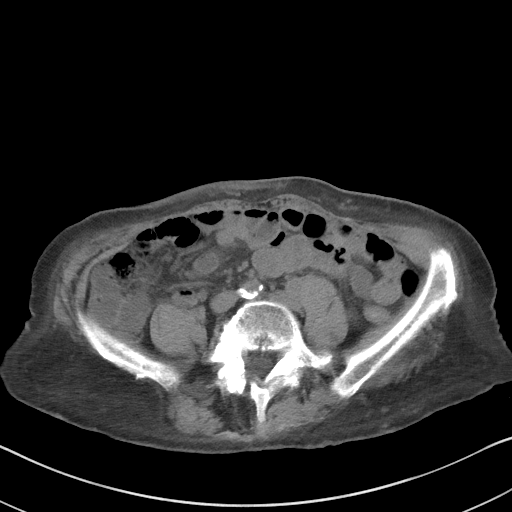
[im 49/88  soft-tissue]
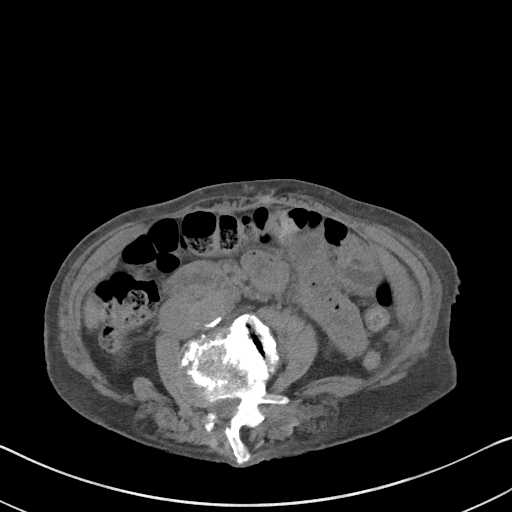
[im 49/88  lung]
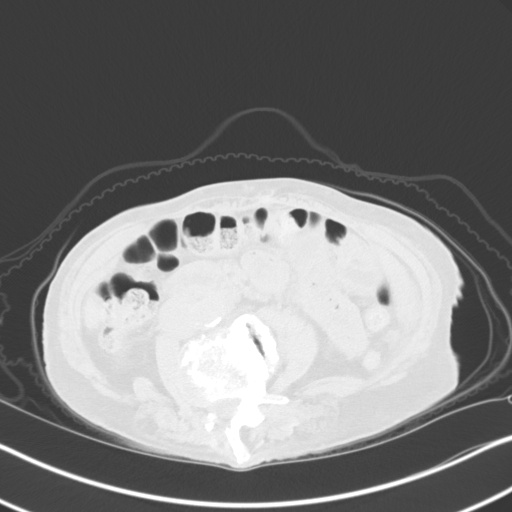
[im 59/88  soft-tissue]
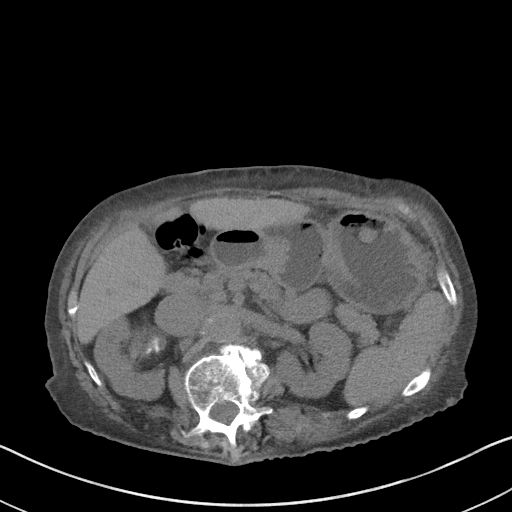
[im 59/88  lung]
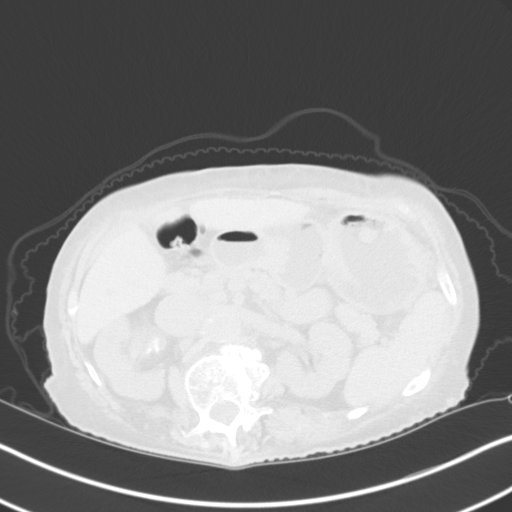
[im 68/88  soft-tissue]
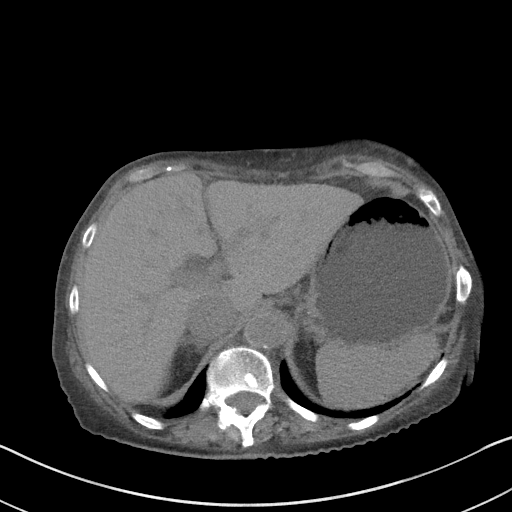
[im 68/88  lung]
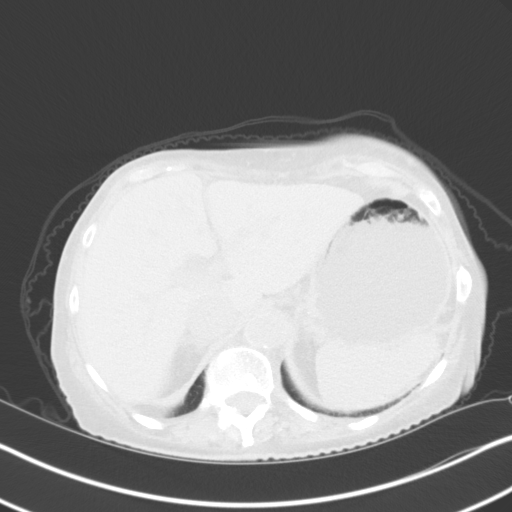
[im 78/88  soft-tissue]
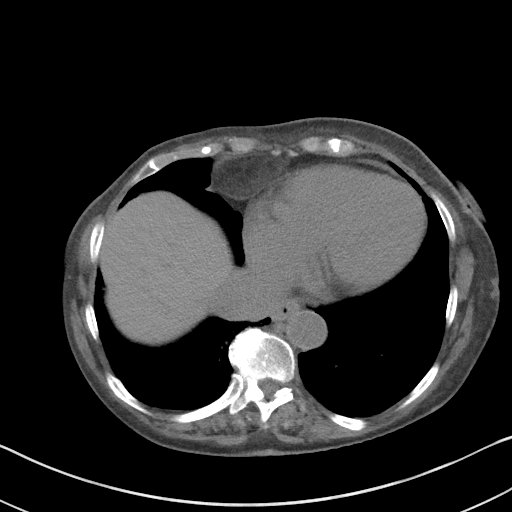
[im 78/88  lung]
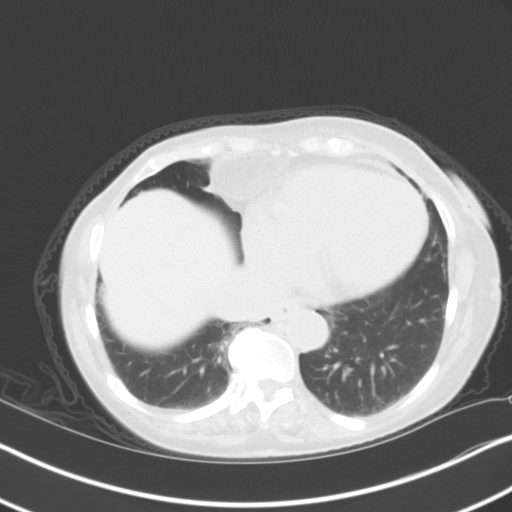

[Series 5: coronal pre · coronal · non-contrast · 0.62mm/px · 2 of 86 slices shown, 3 images]
[im 29/86  soft-tissue]
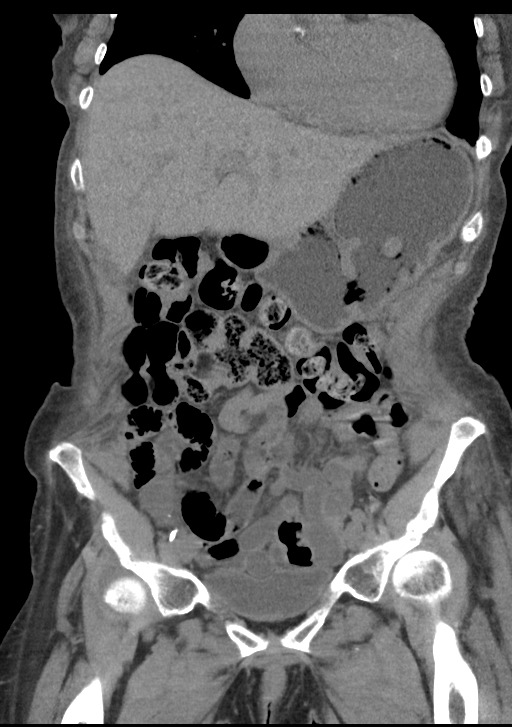
[im 29/86  bone]
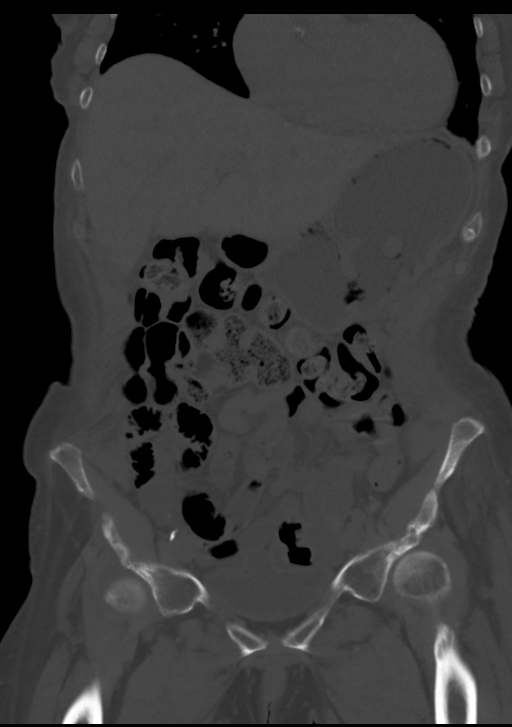
[im 57/86  soft-tissue]
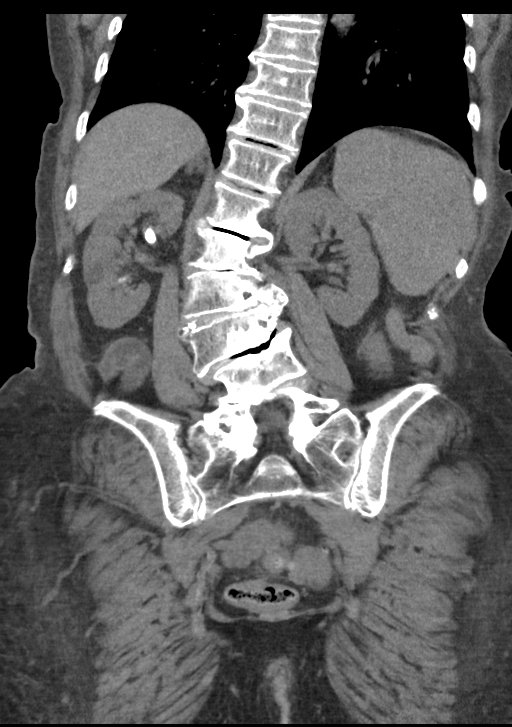

[10 of 46 positions shown; findings below may reference images not displayed]

FINDINGS: Lower chest: No acute abnormality. There is a defect within the
medial aspect of the right hemidiaphragm through which a abdominal
fat herniates into the right CP angle, image 13 of series 5.

Hepatobiliary: No focal liver abnormality is seen. Status post
cholecystectomy. The common bile duct measures 8 mm in maximum
diameter. No obstructing stone or mass noted.

Pancreas: Unremarkable. No pancreatic ductal dilatation or
surrounding inflammatory changes.

Spleen: Normal in size without focal abnormality.

Adrenals/Urinary Tract: Normal adrenal glands. No left renal
calculi. Right-sided staghorn calculus is identified upper pole
component measures 2.1 cm. The inferior pole component measures
cm. Several small right kidney cysts noted. No ureteral calculi. The
urinary bladder is normal.

Stomach/Bowel: Stomach is within normal limits. No evidence of bowel
wall thickening, distention, or inflammatory changes.

Vascular/Lymphatic: Aortic atherosclerosis. No aneurysm. No pelvic
or inguinal adenopathy.

Reproductive: Uterus and bilateral adnexa are unremarkable.

Other: No abdominal wall hernia or abnormality. No abdominopelvic
ascites.

Musculoskeletal: There is a marked scoliosis deformity involving the
lumbar spine which is convex towards the right. Multi level lumbar
degenerative disc disease is identified.
IMPRESSION: 1. Nonobstructive right renal staghorn calculi.
2. No additional findings to explain patient's hematuria.
3. Right medial diaphragmatic hernia which contains abdominal fat
and involves the right CP angle.
4. Aortic atherosclerosis.

## 2018-09-18 ENCOUNTER — Encounter: Payer: Self-pay | Admitting: Podiatry

## 2018-09-18 ENCOUNTER — Ambulatory Visit: Payer: Medicare HMO | Admitting: Podiatry

## 2018-09-18 DIAGNOSIS — Q828 Other specified congenital malformations of skin: Secondary | ICD-10-CM | POA: Diagnosis not present

## 2018-09-18 DIAGNOSIS — M216X1 Other acquired deformities of right foot: Secondary | ICD-10-CM

## 2018-09-18 DIAGNOSIS — D689 Coagulation defect, unspecified: Secondary | ICD-10-CM

## 2018-09-18 NOTE — Progress Notes (Signed)
This patient presents the office with chief complaint of a painful callus under the outside ball of both feet. She says the calluses have become very painful  walking and wearing her shoes.     She presents the office today for an evaluation and treatment of her painful calluses both feet.  Patient is taking Eliquiss.  GENERAL APPEARANCE: Alert, conversant. Appropriately groomed. No acute distress.  VASCULAR: Pedal pulses are  palpable at  [facility] and PT bilateral.  Capillary refill time is immediate to all digits,  Normal temperature gradient.   NEUROLOGIC: sensation is normal to 5.07 monofilament at 5/5 sites bilateral.  Light touch is intact bilateral, Muscle strength normal.  MUSCULOSKELETAL: acceptable muscle strength, tone and stability bilateral.  Intrinsic muscluature intact bilateral.  Plantarflexed fifth metatarsal both feet.  DERMATOLOGIC: skin color, texture, and turgor are within normal limits.  No preulcerative lesions or ulcers  are seen, no interdigital maceration noted.  No open lesions present.  Digital nails are asymptomatic. No drainage noted.   Painful callus sub 5th met left foot.   Porokeratosis  B/L    Plantarflexed 5th met B/L.  ROV  Debridement of porokeratosis both feet   Return to clinic 10 weeks..  Padding dispensed. Patient has been dispensed padding     Gardiner Barefoot DPM .

## 2018-10-25 ENCOUNTER — Ambulatory Visit: Payer: Medicare HMO | Admitting: Gastroenterology

## 2018-10-25 ENCOUNTER — Encounter: Payer: Self-pay | Admitting: Gastroenterology

## 2018-10-25 DIAGNOSIS — K921 Melena: Secondary | ICD-10-CM

## 2018-11-30 ENCOUNTER — Other Ambulatory Visit: Payer: Self-pay | Admitting: Internal Medicine

## 2018-11-30 DIAGNOSIS — Z1231 Encounter for screening mammogram for malignant neoplasm of breast: Secondary | ICD-10-CM

## 2018-12-05 NOTE — Progress Notes (Incomplete)
12/07/2018 4:27 PM   Regina Mathis 08-Sep-1941 [id number]  Referring provider: Casilda Carls, MD [place], [place] [address]  No chief complaint on file.   HPI: Regina Mathis is a 78 y.o. female Caucasian with severe scoliosis, dementia, CHF, a staghorn calculus and gross hematuria who presents today for a three month follow up{ with her sister, Regina Mathis}.  Staghorn calculus/Gross hematuria Review of her medical record show that she underwent a CT scan ordered by her previous PCP on 08/27/2016 for further evaluation of chronic bladder infections and microscopic hematuria.  This showed a large right sided staghorn calculus measuring 3.7 x 1.3 x 1.1 in the upper pole and 3.1 x 1.4 x 1.5 in the lower pole.  She did have a fullness of her collecting system associated with a staghorn.  She also had incidental finding of abnormal endometrial thickening in this problem seemed to take precedence.  She since undergone further evaluation and treatment with Dr. Larey Days.  Additionally, review of records reveal evidence of chronic asymptomatic bacteriuria.  CTU performed on 06/06/2017 noted nonobstructive right renal staghorn calculi.  No additional findings to explain patient's  Hematuria.  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  Aortic atherosclerosis.  Cystoscopy was unremarkable on 06/14/2017.   CT Renal stone study on 09/01/2018 revealed staghorn calculus in the right renal collecting system shows no significant change. No evidence of ureteral calculi or hydronephrosis. Unremarkable unopacified urinary bladder.  Mixed incontinence On 09/06/2018, she was complaining of nocturia, incontinence and gross hematuria.  She was also having fecal incontinence.  Patient denied any gross hematuria, dysuria or suprapubic/flank pain.  Patient denied any fevers, chills, nausea or vomiting.  Her BP was 151/72.  She was currently on Myrbetriq 25  mg daily.  She stated that she feels the Myrbetriq has improved her symptoms and would like to continue.  The patient was experiencing urgency x 0-3, frequency x 0-3, not restricting fluids to avoid visits to the restroom, was engaging in toilet mapping, incontinence x 0-3 and nocturia x 0-3.  Her PVR was 16 mL.  She does have baseline severe scoliosis. Additionally, she is a Restaurant manager, fast food.  ***  PMH: Past Medical History:  Diagnosis Date   GERD (gastroesophageal reflux disease)    Hyperlipidemia    Hypertension    Osteoarthritis    Pre-diabetes    Scoliosis deformity of spine    Valvular heart disease    Echocardiogram 2014- moderate mitral regurg, moderate tricuspid regurg, moderate dilated left atrium and right atrium, EF 50-55%    Surgical History: Past Surgical History:  Procedure Laterality Date   CATARACT EXTRACTION W/ INTRAOCULAR LENS  IMPLANT, BILATERAL     CHOLECYSTECTOMY     COLONOSCOPY WITH PROPOFOL N/A 01/16/2016   Procedure: COLONOSCOPY WITH PROPOFOL;  Surgeon: [name], MD;  Location: [facility] ENDOSCOPY;  Service: Endoscopy;  Laterality: N/A;   HERNIA REPAIR     with Bowel Involvement (04/2012)   HYSTEROSCOPY W/D&C N/A 01/14/2017   Procedure: DILATATION AND CURETTAGE Pollyann Glen;  Surgeon: Honor Loh Ward, MD;  Location: [facility] ORS;  Service: Gynecology;  Laterality: N/A;   JOINT REPLACEMENT Bilateral    Total Knee Arthroplasty (LT 2012) (RT 2013)   Lumpectomy Chest Area     TOTAL KNEE ARTHROPLASTY Bilateral     Home Medications:  Allergies as of 12/07/2018      Reactions   Other       Medication List  Accurate as of December 05, 2018  4:27 PM. Always use your most recent med list.        apixaban 5 MG Tabs tablet Commonly known as:  ELIQUIS Take by mouth 2 (two) times daily.   ciprofloxacin 500 MG tablet Commonly known as:  CIPRO ciprofloxacin 500 mg tablet   donepezil 10 MG tablet Commonly known as:  ARICEPT TAKE 1  TABLET BY MOUTH ONCE DAILY AT NIGHT   furosemide 20 MG tablet Commonly known as:  LASIX furosemide 20 mg tablet   lisinopril-hydrochlorothiazide 20-25 MG tablet Commonly known as:  PRINZIDE,ZESTORETIC lisinopril 20 mg-hydrochlorothiazide 25 mg tablet   memantine 5 MG tablet Commonly known as:  NAMENDA   mirabegron ER 25 MG Tb24 tablet Commonly known as:  MYRBETRIQ Take 1 tablet (25 mg total) by mouth daily.   Norco 5-325 MG tablet Generic drug:  HYDROcodone-acetaminophen Norco 5 mg-325 mg tablet  Take 1- 2  tablet(s) EVERY 4 - 6 HOURS by oral route. PRN PAIN   Probiotic-10 Chew Chew 1 tablet by mouth 2 (two) times daily.   ranitidine 150 MG tablet Commonly known as:  ZANTAC Take 150 mg by mouth at bedtime.   vitamin C 1000 MG tablet Take 1,000 mg by mouth daily.   Vitamin D3 50 MCG (2000 UT) Tabs Take 2,000 Units by mouth daily.       Allergies:  Allergies  Allergen Reactions   Other     Family History: Family History  Problem Relation Age of Onset   CAD Mother    Diabetes Mellitus II Mother    Stroke Mother    Lung cancer Father    Diabetes Mellitus II Sister    Diabetes Mellitus II Brother    Breast cancer Neg Hx    Bladder Cancer Neg Hx    Kidney cancer Neg Hx     Social History:  reports that she has never smoked. She has never used smokeless tobacco. She reports that she does not drink alcohol or use drugs.  ROS:                                        Physical Exam: There were no vitals taken for this visit.  Constitutional: Well nourished. Alert and oriented, No acute distress. {HEENT: B[place] AT, moist mucus membranes.  Trachea midline, no masses.} Cardiovascular: No clubbing, cyanosis, or edema. Respiratory: Normal respiratory effort, no increased work of breathing. {GI: Abdomen is soft, non tender, non distended, no abdominal masses. Liver and spleen not palpable.  No hernias appreciated.  Stool sample for  occult testing is not indicated.} {GU: No CVA tenderness.  No bladder fullness or masses.  *** external genitalia, *** pubic hair distribution, no lesions.  Normal urethral meatus, no lesions, no prolapse, no discharge.   No urethral masses, tenderness and/or tenderness. No bladder fullness, tenderness or masses. *** vagina mucosa, *** estrogen effect, no discharge, no lesions, *** pelvic support, *** cystocele and *** rectocele noted.  No cervical motion tenderness.  Uterus is freely mobile and non-fixed.  No adnexal/parametria masses or tenderness noted.  Anus and perineum are without rashes or lesions.   *** } Skin: No rashes, bruises or suspicious lesions. {Lymph: No cervical or inguinal adenopathy.} Neurologic: Grossly intact, no focal deficits, moving all 4 extremities. Psychiatric: Normal mood and affect.   Laboratory Data: Lab Results  Component Value Date   WBC  5.0 08/16/2018   HGB 13.2 08/16/2018   HCT 39.5 08/16/2018   MCV 90 08/16/2018   PLT 210 08/16/2018    Lab Results  Component Value Date   CREATININE 1.14 (H) 08/16/2018    No results found for: PSA  No results found for: TESTOSTERONE  No results found for: HGBA1C  No results found for: TSH  No results found for: CHOL, HDL, CHOLHDL, VLDL, LDLCALC  No results found for: AST No results found for: ALT No components found for: ALKALINEPHOPHATASE No components found for: BILIRUBINTOTAL  No results found for: ESTRADIOL  Urinalysis    Component Value Date/Time   COLORURINE Yellow 03/21/2012 1300   APPEARANCEUR Cloudy (A) 08/16/2018 1435   LABSPEC 1.011 03/21/2012 1300   PHURINE 6.0 03/21/2012 1300   GLUCOSEU Negative 08/16/2018 1435   GLUCOSEU Negative 03/21/2012 1300   HGBUR Negative 03/21/2012 1300   BILIRUBINUR Negative 08/16/2018 1435   BILIRUBINUR Negative 03/21/2012 1300   KETONESUR Negative 03/21/2012 1300   PROTEINUR 2+ (A) 08/16/2018 1435   PROTEINUR Negative 03/21/2012 1300   NITRITE Negative  08/16/2018 1435   NITRITE Negative 03/21/2012 1300   LEUKOCYTESUR 3+ (A) 08/16/2018 1435   LEUKOCYTESUR 3+ 03/21/2012 1300   I have reviewed the labs.  Assessment & Plan:    1. History of hematuria *** - Likely secondary to large staghorn in the setting of anticoagulation *** - CBC stable  *** - Warning symptoms reviewed  2. Staghorn calculus *** - Large right staghorn calculus  *** - No dramatic change in stone size since 08/2016 compared to most recent CT urogram on 06/07/2017 and CT renal stone study on 09/01/2018 no obvious hydronephrosis or obstruction *** - Complex surgical patient insetting of anticoagulation, severe scoliosis, and Jehov[employer]'s Witness *** - She is not desiring any surgical procedure at this time *** - We'll continue to follow conservatively- warning symptoms reviewed *** - Creatinine is up to 1.14 from 0.91, but the stone is stable and she is not wanting to have any procedure at this time and given her co morbidities this is appropriate  3. Mixed incontinence *** - Patient's insurance will cover the 25 mg of Myrbetriq at an affordable price *** - She will continue this dose and RTC in three months for OAB questionnaire and PVR   No follow-ups on file.  Zara Council, PA-C  [facility] [address], [place], [place] [address] [phone]  I, Adele Schilder, am acting as a Education administrator for [employer], PA-C.   {Add Scribe Attestation Statement}

## 2018-12-07 ENCOUNTER — Ambulatory Visit: Payer: Medicare HMO | Admitting: Urology

## 2018-12-11 ENCOUNTER — Encounter: Payer: Self-pay | Admitting: Gastroenterology

## 2018-12-11 ENCOUNTER — Other Ambulatory Visit: Payer: Self-pay

## 2018-12-11 ENCOUNTER — Ambulatory Visit: Payer: Medicare HMO | Admitting: Gastroenterology

## 2018-12-11 VITALS — BP 91/57 | HR 71 | Ht 62.0 in | Wt 148.0 lb

## 2018-12-11 DIAGNOSIS — K625 Hemorrhage of anus and rectum: Secondary | ICD-10-CM

## 2018-12-11 NOTE — Progress Notes (Signed)
Jonathon Bellows MD, MRCP(U.K) [address]  [place]  [place],  [address]  Main: [phone]  Fax: [phone]   Gastroenterology Consultation  Referring Provider:     Casilda Carls, MD Primary Care Physician:  Casilda Carls, MD Primary Gastroenterologist:  Dr. Jonathon Bellows  Reason for Consultation:     Rectal bleeding         HPI:   Regina Mathis is a 78 y.o. y/o female referred for consultation & management  by Dr. Casilda Carls, MD.    She was referred in 07/2018 for blood in her stool. Labs from 07/2018 shows a Hb of 13.2 grams. At the same time her urine showed 3+ RBC .Last colonoscopy in 12/2015 by Dr Rayann Heman showed 4 polyps and internal hemorrhoids. 3 of which were tubular adenomas. The polyps were < 32mm in size.   She says last episode was blood on toilet paper 6 months back, she is on eloquis . Occurs very seldom, diet low in fiber , no other symptoms. She bruises easily.   Past Medical History:  Diagnosis Date  . GERD (gastroesophageal reflux disease)   . Hyperlipidemia   . Hypertension   . Osteoarthritis   . Pre-diabetes   . Scoliosis deformity of spine   . Valvular heart disease    Echocardiogram 2014- moderate mitral regurg, moderate tricuspid regurg, moderate dilated left atrium and right atrium, EF 50-55%    Past Surgical History:  Procedure Laterality Date  . CATARACT EXTRACTION W/ INTRAOCULAR LENS  IMPLANT, BILATERAL    . CHOLECYSTECTOMY    . COLONOSCOPY WITH PROPOFOL N/A 01/16/2016   Procedure: COLONOSCOPY WITH PROPOFOL;  Surgeon: [name], MD;  Location: [facility] ENDOSCOPY;  Service: Endoscopy;  Laterality: N/A;  . HERNIA REPAIR     with Bowel Involvement (04/2012)  . HYSTEROSCOPY W/D&C N/A 01/14/2017   Procedure: DILATATION AND CURETTAGE /HYSTEROSCOPY;  Surgeon: Honor Loh Ward, MD;  Location: [facility] ORS;  Service: Gynecology;  Laterality: N/A;  . 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 Lumpectomy  Chest Area    . TOTAL KNEE ARTHROPLASTY Bilateral     Prior to Admission medications   Medication Sig Start Date End Date Taking? Authorizing Provider  apixaban (ELIQUIS) 5 MG TABS tablet Take by mouth 2 (two) times daily.  04/07/18  Yes [provider]  Ascorbic Acid (VITAMIN C) 1000 MG tablet Take 1,000 mg by mouth daily.    Yes [provider]  Cholecalciferol (VITAMIN D3) 2000 units TABS Take 2,000 Units by mouth daily. 03/31/17  Yes Cook, Jayce G, DO  donepezil (ARICEPT) 10 MG tablet TAKE 1 TABLET BY MOUTH ONCE DAILY AT NIGHT 06/15/18  Yes [provider]  furosemide (LASIX) 20 MG tablet furosemide 20 mg tablet   Yes [provider]  HYDROcodone-acetaminophen (NORCO) 5-325 MG tablet Norco 5 mg-325 mg tablet  Take 1- 2  tablet(s) EVERY 4 - 6 HOURS by oral route. PRN PAIN   Yes [provider]  lisinopril-hydrochlorothiazide (PRINZIDE,ZESTORETIC) 20-25 MG tablet lisinopril 20 mg-hydrochlorothiazide 25 mg tablet   Yes [provider]  memantine (NAMENDA) 5 MG tablet  06/12/17  Yes [provider]  mirabegron ER (MYRBETRIQ) 25 MG TB24 tablet Take 1 tablet (25 mg total) by mouth daily. 09/06/18  Yes McGowan, Larene Beach A, PA-C  Probiotic Product (PROBIOTIC-10) CHEW Chew 1 tablet by mouth 2 (two) times daily. 03/31/17  Yes Cook, Jayce G, DO  ciprofloxacin (CIPRO) 500  MG tablet ciprofloxacin 500 mg tablet    [provider]  ranitidine (ZANTAC) 150 MG tablet Take 150 mg by mouth at bedtime.  03/11/17   [provider]    Family History  Problem Relation Age of Onset  . CAD Mother   . Diabetes Mellitus II Mother   . Stroke Mother   . Lung cancer Father   . Diabetes Mellitus II Sister   . Diabetes Mellitus II Brother   . Breast cancer Neg Hx   . Bladder Cancer Neg Hx   . Kidney cancer Neg Hx      Social History   Tobacco Use  . Smoking status: Never Smoker  . Smokeless tobacco: Never Used  Substance Use Topics   . Alcohol use: No    Alcohol/week: 0.0 standard drinks  . Drug use: No    Allergies as of 12/11/2018 - Review Complete 12/11/2018  Allergen Reaction Noted  . Other      Review of Systems:    All systems reviewed and negative except where noted in HPI.   Physical Exam:  BP (!) 91/57   Pulse 71   Ht 5\' 2"  (1.575 m)   Wt 148 lb (67.1 kg)   BMI 27.07 kg/m  No LMP recorded. Patient has had a hysterectomy. Psych:  Alert and cooperative. Normal mood and affect. General:   Alert,  Well-developed, well-nourished, pleasant and cooperative in NAD Head:  Normocephalic and atraumatic. Eyes:  Sclera clear, no icterus.   Conjunctiva pink. Ears:  Normal auditory acuity. Nose:  No deformity, discharge, or lesions. Mouth:  No deformity or lesions,oropharynx pink & moist. Neck:  Supple; no masses or thyromegaly. Neurologic:  Alert and oriented x3;  grossly normal neurologically. Skin:  Intact without significant lesions or rashes. No jaundice. Psych:  Alert and cooperative. Normal mood and affect.  Imaging Studies: No results found.  Assessment and Plan:   Regina Mathis is a 78 y.o. y/o female here for rectal bleeding. Normal HB in 07/2018, colonoscopy in 2017 showed 4 polyps < 5 mm and internal hemorrhoids. It is possible that the bleeding is from internal hemorrhoids. On eloquis . Likely bleeding form hemorrhoids . Advised that options are to change to high fiber diet and empirically treat her hemorrhoids. Counseled on peri anal hygiene and wiping . Did explain occasionally a polyp either interval growth or missed in 2017 could cause similar issues and only way to evaluate is a colonoscopy which is is not keen . IF she changes her mind please refer her back. I will plan to check her  CBC today   Follow up PRN  Dr Jonathon Bellows MD,MRCP(U.K)

## 2018-12-12 LAB — CBC WITH DIFFERENTIAL/PLATELET
Basophils Absolute: 0 10*3/uL (ref 0.0–0.2)
Basos: 1 %
EOS (ABSOLUTE): 0 10*3/uL (ref 0.0–0.4)
Eos: 1 %
HEMOGLOBIN: 12.1 g/dL (ref 11.1–15.9)
Hematocrit: 35.9 % (ref 34.0–46.6)
IMMATURE GRANS (ABS): 0 10*3/uL (ref 0.0–0.1)
Immature Granulocytes: 0 %
LYMPHS: 8 %
Lymphocytes Absolute: 0.4 10*3/uL — ABNORMAL LOW (ref 0.7–3.1)
MCH: 29.8 pg (ref 26.6–33.0)
MCHC: 33.7 g/dL (ref 31.5–35.7)
MCV: 88 fL (ref 79–97)
MONOCYTES: 6 %
Monocytes Absolute: 0.3 10*3/uL (ref 0.1–0.9)
NEUTROS ABS: 4 10*3/uL (ref 1.4–7.0)
Neutrophils: 84 %
PLATELETS: 224 10*3/uL (ref 150–450)
RBC: 4.06 x10E6/uL (ref 3.77–5.28)
RDW: 13.4 % (ref 11.7–15.4)
WBC: 4.8 10*3/uL (ref 3.4–10.8)

## 2018-12-15 ENCOUNTER — Encounter: Payer: Self-pay | Admitting: Gastroenterology

## 2018-12-18 ENCOUNTER — Ambulatory Visit: Payer: Medicare HMO | Admitting: Podiatry

## 2019-01-29 ENCOUNTER — Ambulatory Visit: Payer: Medicare HMO | Admitting: Podiatry

## 2019-02-08 ENCOUNTER — Encounter: Payer: Self-pay | Admitting: Podiatry

## 2019-02-08 ENCOUNTER — Other Ambulatory Visit: Payer: Self-pay

## 2019-02-08 ENCOUNTER — Ambulatory Visit: Payer: Medicare HMO | Admitting: Podiatry

## 2019-02-08 VITALS — Temp 98.1°F

## 2019-02-08 DIAGNOSIS — M79675 Pain in left toe(s): Secondary | ICD-10-CM | POA: Diagnosis not present

## 2019-02-08 DIAGNOSIS — B351 Tinea unguium: Secondary | ICD-10-CM | POA: Diagnosis not present

## 2019-02-08 DIAGNOSIS — Q828 Other specified congenital malformations of skin: Secondary | ICD-10-CM | POA: Diagnosis not present

## 2019-02-08 DIAGNOSIS — M216X1 Other acquired deformities of right foot: Secondary | ICD-10-CM

## 2019-02-08 NOTE — Progress Notes (Signed)
This patient presents the office with chief complaint of a painful callus under the outside ball of both feet. She says the calluses have become very painful  walking and wearing her shoes.     She presents the office today for an evaluation and treatment of her painful calluses both feet.   Patient also has pain in big toenail left foot.  Patient is taking Eliquiss.  GENERAL APPEARANCE: Alert, conversant. Appropriately groomed. No acute distress.  VASCULAR: Pedal pulses are  palpable at  [facility] and PT bilateral.  Capillary refill time is immediate to all digits,  Normal temperature gradient.   NEUROLOGIC: sensation is normal to 5.07 monofilament at 5/5 sites bilateral.  Light touch is intact bilateral, Muscle strength normal.  MUSCULOSKELETAL: acceptable muscle strength, tone and stability bilateral.  Intrinsic muscluature intact bilateral.  Plantarflexed fifth metatarsal both feet. SKIN  Thick disfigured discolored nail left hallux. DERMATOLOGIC: skin color, texture, and turgor are within normal limits.  No preulcerative lesions or ulcers  are seen, no interdigital maceration noted.  No open lesions present.  Digital nails are asymptomatic. No drainage noted.   Painful callus sub 5th met left foot.   Porokeratosis  B/L    Plantarflexed 5th met B/L. Onychomycosis left hallux.  ROV  Debridement of porokeratosis both feet   Debride nail left hallux.  Patient was in pain as her nail was treated so Angie needed to finish trimming her nail.   Gardiner Barefoot DPM .

## 2019-03-09 ENCOUNTER — Other Ambulatory Visit
Admission: RE | Admit: 2019-03-09 | Discharge: 2019-03-09 | Disposition: A | Discharge status: Home / Self Care | Payer: Medicare HMO | Source: Ambulatory Visit | Attending: Internal Medicine | Admitting: Internal Medicine

## 2019-03-09 DIAGNOSIS — E785 Hyperlipidemia, unspecified: Secondary | ICD-10-CM | POA: Insufficient documentation

## 2019-03-09 DIAGNOSIS — I959 Hypotension, unspecified: Secondary | ICD-10-CM | POA: Diagnosis not present

## 2019-03-09 DIAGNOSIS — N289 Disorder of kidney and ureter, unspecified: Secondary | ICD-10-CM | POA: Diagnosis not present

## 2019-03-09 DIAGNOSIS — I4891 Unspecified atrial fibrillation: Secondary | ICD-10-CM | POA: Insufficient documentation

## 2019-03-09 DIAGNOSIS — R319 Hematuria, unspecified: Secondary | ICD-10-CM | POA: Insufficient documentation

## 2019-03-09 DIAGNOSIS — M129 Arthropathy, unspecified: Secondary | ICD-10-CM | POA: Insufficient documentation

## 2019-03-09 DIAGNOSIS — I509 Heart failure, unspecified: Secondary | ICD-10-CM | POA: Diagnosis not present

## 2019-03-09 DIAGNOSIS — R412 Retrograde amnesia: Secondary | ICD-10-CM | POA: Insufficient documentation

## 2019-03-09 LAB — BASIC METABOLIC PANEL
Anion gap: 9 (ref 5–15)
BUN: 23 mg/dL (ref 8–23)
CO2: 28 mmol/L (ref 22–32)
Calcium: 9 mg/dL (ref 8.9–10.3)
Chloride: 103 mmol/L (ref 98–111)
Creatinine, Ser: 0.88 mg/dL (ref 0.44–1.00)
GFR calc Af Amer: >60 mL/min (ref >60)
GFR calc non Af Amer: >60 mL/min (ref >60)
Glucose, Bld: 75 mg/dL (ref 70–99)
Potassium: 3.4 mmol/L — ABNORMAL LOW (ref 3.5–5.1)
Sodium: 140 mmol/L (ref 135–145)

## 2019-03-09 LAB — URINALYSIS, ROUTINE W REFLEX MICROSCOPIC
Bilirubin Urine: NEGATIVE
Glucose, UA: NEGATIVE mg/dL
Ketones, ur: NEGATIVE mg/dL
Nitrite: NEGATIVE
Protein, ur: 100 mg/dL — AB
RBC / HPF: >50 RBC/hpf — ABNORMAL HIGH (ref 0–5)
Specific Gravity, Urine: 1.014 (ref 1.005–1.030)
WBC, UA: >50 WBC/hpf — ABNORMAL HIGH (ref 0–5)
pH: 7 (ref 5.0–8.0)

## 2019-03-10 LAB — URINE CULTURE

## 2019-03-12 ENCOUNTER — Ambulatory Visit: Payer: Medicare HMO | Admitting: Urology

## 2019-03-12 ENCOUNTER — Other Ambulatory Visit: Payer: Self-pay

## 2019-04-02 ENCOUNTER — Telehealth: Payer: Self-pay | Admitting: Urology

## 2019-04-02 NOTE — Telephone Encounter (Signed)
Regina Mathis already has issues with memory, so I would like to avoid using oxybutynin.  Would we be able to give samples of Myrbetriq to bridge the gap?

## 2019-04-02 NOTE — Telephone Encounter (Signed)
Spoke with patient's caregiver per patient and notified we could provide a month of samples at this time, placed at front desk

## 2019-04-02 NOTE — Telephone Encounter (Signed)
Ok to switch or possible to give samples?

## 2019-04-02 NOTE — Telephone Encounter (Signed)
Pt is currently taking Myrbetriq and is in an Insurance gap. [employer] company suggested to the pt that she could switch to [place]. Please advise.

## 2019-04-11 ENCOUNTER — Encounter: Payer: Self-pay | Admitting: Urology

## 2019-04-11 ENCOUNTER — Ambulatory Visit: Payer: Medicare HMO | Admitting: Urology

## 2019-04-25 ENCOUNTER — Ambulatory Visit: Payer: Medicare HMO | Admitting: Urology

## 2019-05-10 ENCOUNTER — Encounter: Payer: Self-pay | Admitting: Podiatry

## 2019-05-10 ENCOUNTER — Other Ambulatory Visit: Payer: Self-pay

## 2019-05-10 ENCOUNTER — Ambulatory Visit (INDEPENDENT_AMBULATORY_CARE_PROVIDER_SITE_OTHER): Payer: Medicare HMO | Admitting: Podiatry

## 2019-05-10 VITALS — Temp 98.5°F

## 2019-05-10 DIAGNOSIS — M216X1 Other acquired deformities of right foot: Secondary | ICD-10-CM

## 2019-05-10 DIAGNOSIS — Q828 Other specified congenital malformations of skin: Secondary | ICD-10-CM | POA: Diagnosis not present

## 2019-05-10 DIAGNOSIS — D689 Coagulation defect, unspecified: Secondary | ICD-10-CM | POA: Diagnosis not present

## 2019-05-10 NOTE — Progress Notes (Signed)
This patient present to the office  with chief complaint of callus developing under the outside of ball of left feet.  She says this callus has become painful walking and wearing her shoes. Patient has provided no  treatment or sought professional help.  She presents to the office for treatment of her painful callus. Patient says she has done her own nails.  Vascular  Dorsalis pedis and posterior tibial pulses are palpable  B/L.  Capillary return  WNL.  Temperature gradient is  WNL.  Skin turgor  WNL  Sensorium  Senn Weinstein monofilament wire  WNL. Normal tactile sensation.  Nail Exam  Patient has normal nails with no evidence of bacterial or fungal infection.  Orthopedic  Exam  Muscle tone and muscle strength  WNL.  No limitations of motion feet  B/L.  No crepitus or joint effusion noted.  Foot type is unremarkable and digits show no abnormalities.  Bony prominences are unremarkable.  Plantar flexed fifth metatarsal  B/L.  Skin  No open lesions.  Normal skin texture and turgor.  Callus/porokeratosis  sub 5th  Left.  Right porokeratosis sub 5th asymptomatic.  Porokeratosis secondary plantar flexed fifth metatarsal  Left.  Debride callus/porokeratosis.    Gardiner Barefoot DPM

## 2019-08-16 ENCOUNTER — Ambulatory Visit: Payer: Medicare HMO | Admitting: Podiatry

## 2019-09-10 ENCOUNTER — Ambulatory Visit: Payer: Medicare HMO | Admitting: Podiatry

## 2019-09-10 ENCOUNTER — Encounter: Payer: Self-pay | Admitting: Podiatry

## 2019-09-10 ENCOUNTER — Other Ambulatory Visit: Payer: Self-pay

## 2019-09-10 DIAGNOSIS — B351 Tinea unguium: Secondary | ICD-10-CM

## 2019-09-10 DIAGNOSIS — Q828 Other specified congenital malformations of skin: Secondary | ICD-10-CM | POA: Diagnosis not present

## 2019-09-10 DIAGNOSIS — M216X1 Other acquired deformities of right foot: Secondary | ICD-10-CM

## 2019-09-10 DIAGNOSIS — D689 Coagulation defect, unspecified: Secondary | ICD-10-CM | POA: Diagnosis not present

## 2019-09-10 DIAGNOSIS — M79675 Pain in left toe(s): Secondary | ICD-10-CM

## 2019-09-10 NOTE — Progress Notes (Signed)
This patient present to the office  with chief complaint of callus developing under the outside of ball of left feet.  She says this callus has become painful walking and wearing her shoes. Patient has provided no  treatment or sought professional help.  She presents to the office for treatment of her painful callus. Patient requests her nails be trimmed.  Vascular  Dorsalis pedis and posterior tibial pulses are palpable  B/L.  Capillary return  WNL.  Temperature gradient is  WNL.  Skin turgor  WNL  Sensorium  Senn Weinstein monofilament wire  WNL. Normal tactile sensation.  Nail Exam  Patient has normal nails with no evidence of bacterial or fungal infection.  Thick ingrown nails hallux  B/L.  Red inflammation along medial nail groove fourth toe right foot.  Orthopedic  Exam  Muscle tone and muscle strength  WNL.  No limitations of motion feet  B/L.  No crepitus or joint effusion noted.  Foot type is unremarkable and digits show no abnormalities.  Bony prominences are unremarkable.  Plantar flexed fifth metatarsal  B/L.  Skin  No open lesions.  Normal skin texture and turgor.  Callus/porokeratosis  sub 5th  Left.  Right porokeratosis sub 5th asymptomatic.  Porokeratosis secondary plantar flexed fifth metatarsal  Left.  Debride hallux nails  B/L.  Debride callus/porokeratosis.  Patient was anxious and kept telling me what needed to be treated next.  Gardiner Barefoot DPM

## 2019-12-03 ENCOUNTER — Ambulatory Visit: Payer: Medicare HMO | Admitting: Podiatry

## 2019-12-03 ENCOUNTER — Other Ambulatory Visit: Payer: Self-pay

## 2019-12-03 ENCOUNTER — Encounter: Payer: Self-pay | Admitting: Podiatry

## 2019-12-03 VITALS — Temp 98.1°F

## 2019-12-03 DIAGNOSIS — D689 Coagulation defect, unspecified: Secondary | ICD-10-CM | POA: Diagnosis not present

## 2019-12-03 DIAGNOSIS — M79675 Pain in left toe(s): Secondary | ICD-10-CM

## 2019-12-03 DIAGNOSIS — Q828 Other specified congenital malformations of skin: Secondary | ICD-10-CM | POA: Diagnosis not present

## 2019-12-03 DIAGNOSIS — B351 Tinea unguium: Secondary | ICD-10-CM

## 2019-12-03 DIAGNOSIS — M216X1 Other acquired deformities of right foot: Secondary | ICD-10-CM

## 2019-12-03 DIAGNOSIS — M216X2 Other acquired deformities of left foot: Secondary | ICD-10-CM

## 2019-12-03 NOTE — Progress Notes (Signed)
This patient present to the office  with chief complaint of callus developing under the outside of ball of left feet.  She says this callus has become painful walking and wearing her shoes. Patient has provided no  treatment or sought professional help.  She presents to the office for treatment of her painful callus. Patient says she is also needs her nail worked on.  Patient is taking  Eliquiss.  Vascular  Dorsalis pedis and posterior tibial pulses are palpable  B/L.  Capillary return  WNL.  Temperature gradient is  WNL.  Skin turgor  WNL  Sensorium  Senn Weinstein monofilament wire  WNL. Normal tactile sensation.  Nail Exam  Patient has normal nails with no evidence of bacterial or fungal infection. Thick ingrown toenails hallux  B/L.  Orthopedic  Exam  Muscle tone and muscle strength  WNL.  No limitations of motion feet  B/L.  No crepitus or joint effusion noted.  Foot type is unremarkable and digits show no abnormalities.  Bony prominences are unremarkable.  Plantar flexed fifth metatarsal  B/L.  Skin  No open lesions.  Normal skin texture and turgor.  Callus/porokeratosis  sub 5th  Left.  Right porokeratosis sub 5th asymptomatic.  Porokeratosis secondary plantar flexed fifth metatarsal  B/L.  Ingrown nails  Hallux  B/L.  Debride callus/porokeratosis.  Debride hallux nails  B/L.    Gardiner Barefoot DPM

## 2019-12-10 ENCOUNTER — Ambulatory Visit: Payer: Medicare HMO | Admitting: Podiatry

## 2020-03-10 ENCOUNTER — Encounter: Payer: Self-pay | Admitting: Podiatry

## 2020-03-10 ENCOUNTER — Ambulatory Visit: Payer: Medicare Other | Admitting: Podiatry

## 2020-03-10 ENCOUNTER — Other Ambulatory Visit: Payer: Self-pay

## 2020-03-10 DIAGNOSIS — D689 Coagulation defect, unspecified: Secondary | ICD-10-CM | POA: Diagnosis not present

## 2020-03-10 DIAGNOSIS — M79675 Pain in left toe(s): Secondary | ICD-10-CM | POA: Diagnosis not present

## 2020-03-10 DIAGNOSIS — B351 Tinea unguium: Secondary | ICD-10-CM | POA: Diagnosis not present

## 2020-03-10 DIAGNOSIS — M216X1 Other acquired deformities of right foot: Secondary | ICD-10-CM

## 2020-03-10 DIAGNOSIS — Q828 Other specified congenital malformations of skin: Secondary | ICD-10-CM | POA: Diagnosis not present

## 2020-03-10 NOTE — Progress Notes (Addendum)
This patient returns to my office for at risk foot care.  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  This patient is unable to cut nails herself since the patient cannot reach her nails.These nails are painful walking and wearing shoes.  This patient presents for at risk foot care today.  She presents to the office with her caregiver.  General Appearance  Alert, conversant and in no acute stress.  Vascular  Dorsalis pedis and posterior tibial  pulses are palpable  bilaterally.  Capillary return is within normal limits  bilaterally. Temperature is within normal limits  bilaterally.  Neurologic  Senn-Weinstein monofilament wire test within normal limits  bilaterally. Muscle power within normal limits bilaterally.  Nails Thick ingrown hallux nails   B/L. No evidence of bacterial infection or drainage bilaterally.  Orthopedic  No limitations of motion  feet .  No crepitus or effusions noted.  No bony pathology or digital deformities noted.  Skin  normotropic skin with no porokeratosis noted bilaterally.  No signs of infections or ulcers noted.   Callus sub 5 left foot.  Onychomycosis  Pain in right toes  Pain in left toes  porokeratosis  Consent was obtained for treatment procedures.   Mechanical debridement of nails 1-5  bilaterally performed with a nail nipper.  Filed with dremel without incident. Debride porokeratosis sub 5th left foot.   Return office visit   3 months                  Told patient to return for periodic foot care and evaluation due to potential at risk complications.   Gardiner Barefoot DPM

## 2020-04-10 ENCOUNTER — Encounter: Payer: Self-pay | Admitting: Podiatry

## 2020-04-10 ENCOUNTER — Ambulatory Visit: Payer: Medicare Other | Admitting: Podiatry

## 2020-04-10 ENCOUNTER — Other Ambulatory Visit: Payer: Self-pay

## 2020-04-10 DIAGNOSIS — M7752 Other enthesopathy of left foot: Secondary | ICD-10-CM | POA: Diagnosis not present

## 2020-04-10 DIAGNOSIS — M216X2 Other acquired deformities of left foot: Secondary | ICD-10-CM | POA: Diagnosis not present

## 2020-04-10 DIAGNOSIS — Q828 Other specified congenital malformations of skin: Secondary | ICD-10-CM

## 2020-04-10 DIAGNOSIS — D689 Coagulation defect, unspecified: Secondary | ICD-10-CM | POA: Diagnosis not present

## 2020-04-10 DIAGNOSIS — M719 Bursopathy, unspecified: Secondary | ICD-10-CM | POA: Insufficient documentation

## 2020-04-10 NOTE — Progress Notes (Signed)
This patient returns to my office for at risk foot care.  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  This patient states she is having severe pain under the outside ball of her left foot.  She has severe pain due to callus and prominent bone under the outside ball of left foot.  This patient presents for at risk foot care today.  She presents to the office with her caregiver.  General Appearance  Alert, conversant and in no acute stress.  Vascular  Dorsalis pedis and posterior tibial  pulses are palpable  bilaterally.  Capillary return is within normal limits  bilaterally. Temperature is within normal limits  bilaterally.  Neurologic  Senn-Weinstein monofilament wire test within normal limits  bilaterally. Muscle power within normal limits bilaterally.  Nails Thick ingrown hallux nails   B/L. No evidence of bacterial infection or drainage bilaterally.  Orthopedic  No limitations of motion  feet .  No crepitus or effusions noted.  No bony pathology or digital deformities noted.  Skin  normotropic skin with no porokeratosis noted bilaterally.  No signs of infections or ulcers noted.   Callus sub 5 left foot.  Porokeratosis sub 5 left foot.  Prominent metatarsal 4,5 left foot.    Consent was obtained for treatment procedures.    Debride porokeratosis sub 5th left foot with dremel tool.  Padding was added to her left shoe.  Told her she needs to receive dispersion padding from  [place].  She leaves the office saying her left foot feels much better.  To consider referral to [facility] in future.     Return office visit   6 weeks                  Told patient to return for periodic foot care and evaluation due to potential at risk complications.   Gardiner Barefoot DPM

## 2020-05-22 ENCOUNTER — Ambulatory Visit (INDEPENDENT_AMBULATORY_CARE_PROVIDER_SITE_OTHER): Payer: Medicare Other | Admitting: Podiatry

## 2020-05-22 ENCOUNTER — Other Ambulatory Visit: Payer: Self-pay

## 2020-05-22 ENCOUNTER — Encounter: Payer: Self-pay | Admitting: Podiatry

## 2020-05-22 DIAGNOSIS — M7752 Other enthesopathy of left foot: Secondary | ICD-10-CM

## 2020-05-22 DIAGNOSIS — M216X1 Other acquired deformities of right foot: Secondary | ICD-10-CM

## 2020-05-22 DIAGNOSIS — Q828 Other specified congenital malformations of skin: Secondary | ICD-10-CM

## 2020-05-22 DIAGNOSIS — D689 Coagulation defect, unspecified: Secondary | ICD-10-CM | POA: Diagnosis not present

## 2020-05-22 DIAGNOSIS — M216X2 Other acquired deformities of left foot: Secondary | ICD-10-CM | POA: Diagnosis not present

## 2020-05-22 NOTE — Progress Notes (Signed)
This patient returns to the office for painful bursitis outside of left foot.  She says there is less pain and discomfort wearing dispersion pad and previous treatment.  She is pleased with her improvement and desires padding outside ball of right foot. She is accompanied by her caregiver.   Patient is taking eliquis.    Vascular  Dorsalis pedis and posterior tibial pulses are palpable  B/L.  Capillary return  WNL.  Temperature gradient is  WNL.  Skin turgor  WNL  Sensorium  Senn Weinstein monofilament wire  WNL. Normal tactile sensation.  Nail Exam  Patient has normal nails with no evidence of bacterial or fungal infection. Patient has thick nails both big toes.  Orthopedic  Exam  Muscle tone and muscle strength  WNL.  No limitations of motion feet  B/L.  No crepitus or joint effusion noted.  Foot type is unremarkable and digits show no abnormalities.  Bony prominences are unremarkable.  Skin  No open lesions.  Normal skin texture and turgor. Callus sub 5th  B/L.  Bursitis fifth metatarsal  B/L.  ROV.  Padding applied to her right shoe.   Gardiner Barefoot DPM

## 2020-06-16 ENCOUNTER — Encounter: Payer: Self-pay | Admitting: Podiatry

## 2020-06-16 ENCOUNTER — Other Ambulatory Visit: Payer: Self-pay

## 2020-06-16 ENCOUNTER — Ambulatory Visit: Payer: Medicare Other | Admitting: Podiatry

## 2020-06-16 DIAGNOSIS — M216X2 Other acquired deformities of left foot: Secondary | ICD-10-CM

## 2020-06-16 DIAGNOSIS — M216X1 Other acquired deformities of right foot: Secondary | ICD-10-CM

## 2020-06-16 DIAGNOSIS — I10 Essential (primary) hypertension: Secondary | ICD-10-CM

## 2020-06-16 DIAGNOSIS — Q828 Other specified congenital malformations of skin: Secondary | ICD-10-CM

## 2020-06-16 NOTE — Progress Notes (Signed)
This patient presents to the office for continued evaluation and treatment of her porokeratosis/bursitis.  She presents to the office saying her feet have been improving with the insoles in her shoes.  She is taking eliquis.  Vascular  Dorsalis pedis and posterior tibial pulses are palpable  B/L.  Capillary return  WNL.  Temperature gradient is  WNL.  Skin turgor  WNL  Sensorium  Senn Weinstein monofilament wire  WNL. Normal tactile sensation.  Nail Exam  Patient has normal nails with no evidence of bacterial or fungal infection.  Orthopedic  Exam  Muscle tone and muscle strength  WNL.  No limitations of motion feet  B/L.  No crepitus or joint effusion noted.  Foot type is unremarkable and digits show no abnormalities.  Bony prominences are unremarkable. Plantar flexed fifth metatarsal  B/L.  Skin  No open lesions.  Normal skin texture and turgor.  Porokeratosis sub 5th met  B/L  Bursitis/Porokeratosis  B/l  ROV.  Examined her insoles which she is using and they are doing well and not breaking down.  RTC 9 weeks.     DPM 

## 2020-07-22 ENCOUNTER — Emergency Department: Payer: Medicare HMO

## 2020-07-22 ENCOUNTER — Inpatient Hospital Stay
Admission: EM | Admit: 2020-07-22 | Discharge: 2020-07-25 | DRG: 536 | Disposition: A | Discharge status: Skilled Nursing Facility | Payer: Medicare HMO | Attending: Internal Medicine | Admitting: Internal Medicine

## 2020-07-22 ENCOUNTER — Other Ambulatory Visit: Payer: Self-pay

## 2020-07-22 DIAGNOSIS — S72131A Displaced apophyseal fracture of right femur, initial encounter for closed fracture: Secondary | ICD-10-CM | POA: Diagnosis present

## 2020-07-22 DIAGNOSIS — Y92009 Unspecified place in unspecified non-institutional (private) residence as the place of occurrence of the external cause: Secondary | ICD-10-CM | POA: Diagnosis not present

## 2020-07-22 DIAGNOSIS — R7989 Other specified abnormal findings of blood chemistry: Secondary | ICD-10-CM

## 2020-07-22 DIAGNOSIS — F0390 Unspecified dementia without behavioral disturbance: Secondary | ICD-10-CM | POA: Diagnosis present

## 2020-07-22 DIAGNOSIS — Z801 Family history of malignant neoplasm of trachea, bronchus and lung: Secondary | ICD-10-CM

## 2020-07-22 DIAGNOSIS — N200 Calculus of kidney: Secondary | ICD-10-CM | POA: Diagnosis present

## 2020-07-22 DIAGNOSIS — M8949 Other hypertrophic osteoarthropathy, multiple sites: Secondary | ICD-10-CM

## 2020-07-22 DIAGNOSIS — M1611 Unilateral primary osteoarthritis, right hip: Secondary | ICD-10-CM | POA: Diagnosis present

## 2020-07-22 DIAGNOSIS — R8290 Unspecified abnormal findings in urine: Secondary | ICD-10-CM | POA: Diagnosis present

## 2020-07-22 DIAGNOSIS — Z833 Family history of diabetes mellitus: Secondary | ICD-10-CM | POA: Diagnosis not present

## 2020-07-22 DIAGNOSIS — Z96653 Presence of artificial knee joint, bilateral: Secondary | ICD-10-CM | POA: Diagnosis present

## 2020-07-22 DIAGNOSIS — W19XXXA Unspecified fall, initial encounter: Secondary | ICD-10-CM | POA: Diagnosis present

## 2020-07-22 DIAGNOSIS — K219 Gastro-esophageal reflux disease without esophagitis: Secondary | ICD-10-CM | POA: Diagnosis present

## 2020-07-22 DIAGNOSIS — Z7901 Long term (current) use of anticoagulants: Secondary | ICD-10-CM

## 2020-07-22 DIAGNOSIS — I10 Essential (primary) hypertension: Secondary | ICD-10-CM | POA: Diagnosis present

## 2020-07-22 DIAGNOSIS — R296 Repeated falls: Secondary | ICD-10-CM | POA: Diagnosis present

## 2020-07-22 DIAGNOSIS — Z20822 Contact with and (suspected) exposure to covid-19: Secondary | ICD-10-CM | POA: Diagnosis present

## 2020-07-22 DIAGNOSIS — M159 Polyosteoarthritis, unspecified: Secondary | ICD-10-CM | POA: Diagnosis present

## 2020-07-22 DIAGNOSIS — E876 Hypokalemia: Secondary | ICD-10-CM | POA: Diagnosis not present

## 2020-07-22 DIAGNOSIS — S82091A Other fracture of right patella, initial encounter for closed fracture: Secondary | ICD-10-CM | POA: Diagnosis present

## 2020-07-22 DIAGNOSIS — R7401 Elevation of levels of liver transaminase levels: Secondary | ICD-10-CM | POA: Diagnosis not present

## 2020-07-22 DIAGNOSIS — M810 Age-related osteoporosis without current pathological fracture: Secondary | ICD-10-CM | POA: Diagnosis present

## 2020-07-22 DIAGNOSIS — M9711XA Periprosthetic fracture around internal prosthetic right knee joint, initial encounter: Secondary | ICD-10-CM | POA: Diagnosis present

## 2020-07-22 DIAGNOSIS — I4891 Unspecified atrial fibrillation: Secondary | ICD-10-CM | POA: Diagnosis present

## 2020-07-22 DIAGNOSIS — W19XXXS Unspecified fall, sequela: Secondary | ICD-10-CM | POA: Diagnosis not present

## 2020-07-22 DIAGNOSIS — W1830XA Fall on same level, unspecified, initial encounter: Secondary | ICD-10-CM | POA: Diagnosis present

## 2020-07-22 DIAGNOSIS — E785 Hyperlipidemia, unspecified: Secondary | ICD-10-CM | POA: Diagnosis present

## 2020-07-22 DIAGNOSIS — Z8249 Family history of ischemic heart disease and other diseases of the circulatory system: Secondary | ICD-10-CM

## 2020-07-22 DIAGNOSIS — Z823 Family history of stroke: Secondary | ICD-10-CM

## 2020-07-22 DIAGNOSIS — N390 Urinary tract infection, site not specified: Secondary | ICD-10-CM | POA: Diagnosis present

## 2020-07-22 DIAGNOSIS — I340 Nonrheumatic mitral (valve) insufficiency: Secondary | ICD-10-CM | POA: Diagnosis present

## 2020-07-22 DIAGNOSIS — S61512A Laceration without foreign body of left wrist, initial encounter: Secondary | ICD-10-CM | POA: Diagnosis present

## 2020-07-22 DIAGNOSIS — T361X5A Adverse effect of cephalosporins and other beta-lactam antibiotics, initial encounter: Secondary | ICD-10-CM | POA: Diagnosis not present

## 2020-07-22 LAB — CBC WITH DIFFERENTIAL/PLATELET
Abs Immature Granulocytes: 0.03 10*3/uL (ref 0.00–0.07)
Basophils Absolute: 0.1 10*3/uL (ref 0.0–0.1)
Basophils Relative: 1 %
Eosinophils Absolute: 0 10*3/uL (ref 0.0–0.5)
Eosinophils Relative: 0 %
HCT: 37.4 % (ref 36.0–46.0)
Hemoglobin: 12.1 g/dL (ref 12.0–15.0)
Immature Granulocytes: 0 %
Lymphocytes Relative: 5 %
Lymphs Abs: 0.5 10*3/uL — ABNORMAL LOW (ref 0.7–4.0)
MCH: 29.1 pg (ref 26.0–34.0)
MCHC: 32.4 g/dL (ref 30.0–36.0)
MCV: 89.9 fL (ref 80.0–100.0)
Monocytes Absolute: 0.6 10*3/uL (ref 0.1–1.0)
Monocytes Relative: 6 %
Neutro Abs: 8.5 10*3/uL — ABNORMAL HIGH (ref 1.7–7.7)
Neutrophils Relative %: 88 %
Platelets: 207 10*3/uL (ref 150–400)
RBC: 4.16 MIL/uL (ref 3.87–5.11)
RDW: 14.6 % (ref 11.5–15.5)
WBC: 9.8 10*3/uL (ref 4.0–10.5)
nRBC: 0 % (ref 0.0–0.2)

## 2020-07-22 LAB — RESPIRATORY PANEL BY RT PCR (FLU A&B, COVID)
Influenza A by PCR: NEGATIVE
Influenza B by PCR: NEGATIVE
SARS Coronavirus 2 by RT PCR: NEGATIVE

## 2020-07-22 LAB — COMPREHENSIVE METABOLIC PANEL
ALT: 14 U/L (ref 0–44)
AST: 20 U/L (ref 15–41)
Albumin: 4.2 g/dL (ref 3.5–5.0)
Alkaline Phosphatase: 62 U/L (ref 38–126)
Anion gap: 9 (ref 5–15)
BUN: 29 mg/dL — ABNORMAL HIGH (ref 8–23)
CO2: 29 mmol/L (ref 22–32)
Calcium: 9.2 mg/dL (ref 8.9–10.3)
Chloride: 102 mmol/L (ref 98–111)
Creatinine, Ser: 0.99 mg/dL (ref 0.44–1.00)
GFR, Estimated: 59 mL/min — ABNORMAL LOW (ref >60)
Glucose, Bld: 121 mg/dL — ABNORMAL HIGH (ref 70–99)
Potassium: 4.2 mmol/L (ref 3.5–5.1)
Sodium: 140 mmol/L (ref 135–145)
Total Bilirubin: 0.6 mg/dL (ref 0.3–1.2)
Total Protein: 7.3 g/dL (ref 6.5–8.1)

## 2020-07-22 LAB — URINALYSIS, COMPLETE (UACMP) WITH MICROSCOPIC
Bilirubin Urine: NEGATIVE
Glucose, UA: NEGATIVE mg/dL
Ketones, ur: NEGATIVE mg/dL
Nitrite: NEGATIVE
Protein, ur: NEGATIVE mg/dL
Specific Gravity, Urine: 1.018 (ref 1.005–1.030)
WBC, UA: >50 WBC/hpf — ABNORMAL HIGH (ref 0–5)
pH: 7 (ref 5.0–8.0)

## 2020-07-22 MED ORDER — OXYCODONE-ACETAMINOPHEN 5-325 MG PO TABS
1.0000 | ORAL_TABLET | Freq: Once | ORAL | Status: AC
  Administered 2020-07-22: 1 via ORAL
  Filled 2020-07-22: qty 1

## 2020-07-22 MED ORDER — MORPHINE SULFATE (PF) 2 MG/ML IV SOLN
2.0000 mg | INTRAVENOUS | Status: DC | PRN
  Administered 2020-07-23: 2 mg via INTRAVENOUS
  Filled 2020-07-22: qty 1

## 2020-07-22 MED ORDER — BACITRACIN-NEOMYCIN-POLYMYXIN 400-5-5000 EX OINT
TOPICAL_OINTMENT | CUTANEOUS | Status: AC
  Filled 2020-07-22: qty 1

## 2020-07-22 MED ORDER — ONDANSETRON HCL 4 MG PO TABS: 4.0000 mg | ORAL_TABLET | Freq: Four times a day (QID) | ORAL | Status: DC | PRN

## 2020-07-22 MED ORDER — SODIUM CHLORIDE 0.45 % IV SOLN: INTRAVENOUS | Status: DC

## 2020-07-22 MED ORDER — ENOXAPARIN SODIUM 40 MG/0.4ML ~~LOC~~ SOLN
40.0000 mg | SUBCUTANEOUS | Status: DC
  Administered 2020-07-23 – 2020-07-24 (×3): 40 mg via SUBCUTANEOUS
  Filled 2020-07-22 (×3): qty 0.4

## 2020-07-22 MED ORDER — ACETAMINOPHEN 650 MG RE SUPP: 650.0000 mg | Freq: Four times a day (QID) | RECTAL | Status: DC | PRN

## 2020-07-22 MED ORDER — BACITRACIN-NEOMYCIN-POLYMYXIN 400-5-5000 EX OINT: TOPICAL_OINTMENT | Freq: Once | CUTANEOUS | Status: AC

## 2020-07-22 MED ORDER — ACETAMINOPHEN 325 MG PO TABS
650.0000 mg | ORAL_TABLET | Freq: Four times a day (QID) | ORAL | Status: DC | PRN
  Administered 2020-07-23 – 2020-07-25 (×2): 650 mg via ORAL
  Filled 2020-07-22 (×2): qty 2

## 2020-07-22 MED ORDER — ONDANSETRON HCL 4 MG/2ML IJ SOLN
4.0000 mg | Freq: Four times a day (QID) | INTRAMUSCULAR | Status: DC | PRN
  Administered 2020-07-23: 4 mg via INTRAVENOUS
  Filled 2020-07-22: qty 2

## 2020-07-22 MED ORDER — IOHEXOL 300 MG/ML  SOLN
100.0000 mL | Freq: Once | INTRAMUSCULAR | Status: AC | PRN
  Administered 2020-07-22: 100 mL via INTRAVENOUS
  Filled 2020-07-22: qty 100

## 2020-07-22 NOTE — ED Notes (Signed)
Admitting provider at bedside.

## 2020-07-22 NOTE — H&P (Signed)
History and Physical   Regina Mathis:[id number] DOB: July 01, 1941 DOA: 07/22/2020  Referring MD/NP/PA: Dr. Quentin Cornwall  PCP: Crissie Figures, PA-C   Outpatient Specialists: None  Patient coming from: Home  Chief Complaint: Fall  HPI: Regina Mathis is a 79 y.o. female with medical history significant of osteoporosis, osteoarthritis status post total knee replacement, hyperlipidemia, atrial fibrillation on chronic anticoagulation, hypertension, GERD, scoliosis, and valvular heart disease who sustained a mechanical fall at home.  Patient has had recurrent falls apparently lately.  Today however she fell and twisted her right knee.  She was brought in unable to walk and in severe pain.  Patient was found to have periprosthetic fracture of the right knee.  She is therefore being admitted to the hospital for further evaluation and treatment.  She has failed to thrive well at home.  Patient will need some evaluation and possibly rehab.  ED Course: Temperature 97.7 blood pressure 162/80 pulse 57 respiratory rate of 18 oxygen sat 98% on room air.  CBC and chemistry largely within normal except BUN 29 and glucose 121.  Urinalysis showed WBC more than 50 rare bacteria large leukocyte.  Patient however asymptomatic.  CT abdomen pelvis CT cervical spine and CT head without contrast all within normal.  4 view x-ray of the right knee showed periprosthetic fracture.  X-ray of the right wrist showed no acute findings.  Patient therefore being admitted to the hospital for further evaluation.  Review of Systems: As per HPI otherwise 10 point review of systems negative.    Past Medical History:  Diagnosis Date  . GERD (gastroesophageal reflux disease)   . Hyperlipidemia   . Hypertension   . Osteoarthritis   . Pre-diabetes   . Scoliosis deformity of spine   . Valvular heart disease    Echocardiogram 2014- moderate mitral regurg, moderate tricuspid regurg, moderate dilated left atrium and right atrium,  EF 50-55%    Past Surgical History:  Procedure Laterality Date  . CATARACT EXTRACTION W/ INTRAOCULAR LENS  IMPLANT, BILATERAL    . CHOLECYSTECTOMY    . COLONOSCOPY WITH PROPOFOL N/A 01/16/2016   Procedure: COLONOSCOPY WITH PROPOFOL;  Surgeon: [name], MD;  Location: [facility] ENDOSCOPY;  Service: Endoscopy;  Laterality: N/A;  . HERNIA REPAIR     with Bowel Involvement (04/2012)  . HYSTEROSCOPY WITH D & C N/A 01/14/2017   Procedure: DILATATION AND CURETTAGE /HYSTEROSCOPY;  Surgeon: Honor Loh Ward, MD;  Location: [facility] ORS;  Service: Gynecology;  Laterality: N/A;  . 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 Lumpectomy Chest Area    . TOTAL KNEE ARTHROPLASTY Bilateral      reports that she has never smoked. She has never used smokeless tobacco. She reports that she does not drink alcohol and does not use drugs.  Allergies  Allergen Reactions  . Other     Family History  Problem Relation Age of Onset  . CAD Mother   . Diabetes Mellitus II Mother   . Stroke Mother   . Lung cancer Father   . Diabetes Mellitus II Sister   . Diabetes Mellitus II Brother   . Breast cancer Neg Hx   . Bladder Cancer Neg Hx   . Kidney cancer Neg Hx      Prior to Admission medications   Medication Sig Start Date End Date Taking? Authorizing Provider  apixaban (ELIQUIS) 5 MG TABS tablet Take by mouth. 05/08/20   [provider]  Ascorbic Acid (VITAMIN C) 1000 MG tablet Take 1,000 mg by mouth daily.     [provider]  Cholecalciferol (VITAMIN D3) 2000 units TABS Take 2,000 Units by mouth daily. 03/31/17   Coral Spikes, DO  ciprofloxacin (CIPRO) 500 MG tablet ciprofloxacin 500 mg tablet    [provider]  citalopram (CELEXA) 10 MG tablet  04/19/19   [provider]  donepezil (ARICEPT) 10 MG tablet Take by mouth. 04/10/19   [provider]  furosemide (LASIX) 20 MG tablet furosemide 20 mg tablet    [provider]  HYDROcodone-acetaminophen (NORCO) 5-325 MG tablet Norco 5 mg-325 mg tablet  Take 1- 2  tablet(s) EVERY 4 - 6 HOURS by oral route. PRN PAIN    [provider]  lisinopril-hydrochlorothiazide (PRINZIDE,ZESTORETIC) 20-25 MG tablet lisinopril 20 mg-hydrochlorothiazide 25 mg tablet    [provider]  memantine (NAMENDA) 5 MG tablet  06/12/17   [provider]  mirabegron ER (MYRBETRIQ) 25 MG TB24 tablet Take 1 tablet (25 mg total) by mouth daily. 09/06/18   Zara Council A, PA-C  omeprazole (PRILOSEC) 20 MG capsule  01/12/19   [provider]  potassium chloride (K-DUR) 10 MEQ tablet Take 10 mEq by mouth daily. 03/12/19   [provider]  Probiotic Product (PROBIOTIC-10) CHEW Chew 1 tablet by mouth 2 (two) times daily. 03/31/17   Coral Spikes, DO  ranitidine (ZANTAC) 150 MG tablet Take 150 mg by mouth at bedtime.  03/11/17   [provider]  simvastatin (ZOCOR) 40 MG tablet  12/22/18   [provider]  tiZANidine (ZANAFLEX) 2 MG tablet  01/29/19   [provider]    Physical Exam: Vitals:   07/22/20 1740 07/22/20 1741 07/22/20 1800 07/22/20 1815  BP: (!) 148/64  (!) 162/80   Pulse: (!) 55  (!) 57 (!) 56  Resp: 18     Temp: 97.7 F (36.5 C)     TempSrc: Oral     SpO2: 99%  98% 98%  Weight:  67 kg    Height:  5\' 2"  (1.575 m)        Constitutional: Frail, chronically ill looking Vitals:   07/22/20 1740 07/22/20 1741 07/22/20 1800 07/22/20 1815  BP: (!) 148/64  (!) 162/80   Pulse: (!) 55  (!) 57 (!) 56  Resp: 18     Temp: 97.7 F (36.5 C)     TempSrc: Oral     SpO2: 99%  98% 98%  Weight:  67 kg    Height:  5\' 2"  (1.575 m)     Eyes: PERRL, lids and conjunctivae normal ENMT: Mucous membranes are moist. Posterior pharynx clear of any exudate or lesions.Normal dentition.  Neck: normal, supple, no masses, no thyromegaly Respiratory: clear to auscultation bilaterally, no wheezing, no crackles. Normal respiratory  effort. No accessory muscle use.  Cardiovascular: Irregularly irregular rate and rhythm, no murmurs / rubs / gallops. No extremity edema. 2+ pedal pulses. No carotid bruits.  Abdomen: no tenderness, no masses palpated. No hepatosplenomegaly. Bowel sounds positive.  Musculoskeletal: no clubbing / cyanosis. No joint deformity upper and lower extremities.  Right knee is swollen, tender, warm to touch, crepitus, currently immobilized  skin: no rashes, lesions, ulcers. No induration Neurologic: CN 2-12 grossly intact. Sensation intact, DTR normal. Strength 5/5 in all 4.  Psychiatric: Normal judgment and insight. Alert and oriented x 3. Normal mood.     Labs on Admission: I have personally reviewed following labs and  imaging studies  CBC: Recent Labs  Lab 07/22/20 1825  WBC 9.8  NEUTROABS 8.5*  HGB 12.1  HCT 37.4  MCV 89.9  PLT 209   Basic Metabolic Panel: Recent Labs  Lab 07/22/20 1825  NA 140  K 4.2  CL 102  CO2 29  GLUCOSE 121*  BUN 29*  CREATININE 0.99  CALCIUM 9.2   GFR: Estimated Creatinine Clearance: 42.7 mL/min (by C-G formula based on SCr of 0.99 mg/dL). Liver Function Tests: Recent Labs  Lab 07/22/20 1825  AST 20  ALT 14  ALKPHOS 62  BILITOT 0.6  PROT 7.3  ALBUMIN 4.2   No results for input(s): LIPASE, AMYLASE in the last 168 hours. No results for input(s): AMMONIA in the last 168 hours. Coagulation Profile: No results for input(s): INR, PROTIME in the last 168 hours. Cardiac Enzymes: No results for input(s): CKTOTAL, CKMB, CKMBINDEX, TROPONINI in the last 168 hours. BNP (last 3 results) No results for input(s): PROBNP in the last 8760 hours. HbA1C: No results for input(s): HGBA1C in the last 72 hours. CBG: No results for input(s): GLUCAP in the last 168 hours. Lipid Profile: No results for input(s): CHOL, HDL, LDLCALC, TRIG, CHOLHDL, LDLDIRECT in the last 72 hours. Thyroid Function Tests: No results for input(s): TSH, T4TOTAL, FREET4, T3FREE,  THYROIDAB in the last 72 hours. Anemia Panel: No results for input(s): VITAMINB12, FOLATE, FERRITIN, TIBC, IRON, RETICCTPCT in the last 72 hours. Urine analysis:    Component Value Date/Time   COLORURINE STRAW (A) 07/22/2020 2105   APPEARANCEUR HAZY (A) 07/22/2020 2105   APPEARANCEUR Cloudy (A) 08/16/2018 1435   LABSPEC 1.018 07/22/2020 2105   LABSPEC 1.011 03/21/2012 1300   PHURINE 7.0 07/22/2020 2105   GLUCOSEU NEGATIVE 07/22/2020 2105   GLUCOSEU Negative 03/21/2012 1300   HGBUR MODERATE (A) 07/22/2020 2105   BILIRUBINUR NEGATIVE 07/22/2020 2105   BILIRUBINUR Negative 08/16/2018 1435   BILIRUBINUR Negative 03/21/2012 [place] 07/22/2020 2105   PROTEINUR NEGATIVE 07/22/2020 2105   NITRITE NEGATIVE 07/22/2020 2105   LEUKOCYTESUR LARGE (A) 07/22/2020 2105   LEUKOCYTESUR 3+ 03/21/2012 1300   Sepsis Labs: @LABRCNTIP (procalcitonin:4,lacticidven:4) ) Recent Results (from the past 240 hour(s))  Respiratory Panel by RT PCR (Flu A&B, Covid) - Nasopharyngeal Swab     Status: None   Collection Time: 07/22/20  9:05 PM   Specimen: Nasopharyngeal Swab  Result Value Ref Range Status   SARS Coronavirus 2 by RT PCR NEGATIVE NEGATIVE Final    Comment: (NOTE) SARS-CoV-2 target nucleic acids are NOT DETECTED.  The SARS-CoV-2 RNA is generally detectable in upper respiratoy specimens during the acute phase of infection. The lowest concentration of SARS-CoV-2 viral copies this assay can detect is 131 copies/mL. A negative result does not preclude SARS-Cov-2 infection and should not be used as the sole basis for treatment or other patient management decisions. A negative result may occur with  improper specimen collection/handling, submission of specimen other than nasopharyngeal swab, presence of viral mutation(s) within the areas targeted by this assay, and inadequate number of viral copies (<131 copies/mL). A negative result must be combined with clinical observations,  patient history, and epidemiological information. The expected result is Negative.  Fact Sheet for Patients:  PinkCheek.be  Fact Sheet for Healthcare Providers:  GravelBags.it  This test is no t yet approved or cleared by the [place] FDA and  has been authorized for detection and/or diagnosis of SARS-CoV-2 by FDA under an Emergency Use Authorization (EUA). This EUA will remain  in  effect (meaning this test can be used) for the duration of the COVID-19 declaration under Section 564(b)(1) of the Act, 21 U.S.C. section 360bbb-3(b)(1), unless the authorization is terminated or revoked sooner.     Influenza A by PCR NEGATIVE NEGATIVE Final   Influenza B by PCR NEGATIVE NEGATIVE Final    Comment: (NOTE) The Xpert Xpress SARS-CoV-2/FLU/RSV assay is intended as an aid in  the diagnosis of influenza from Nasopharyngeal swab specimens and  should not be used as a sole basis for treatment. Nasal washings and  aspirates are unacceptable for Xpert Xpress SARS-CoV-2/FLU/RSV  testing.  Fact Sheet for Patients: PinkCheek.be  Fact Sheet for Healthcare Providers: GravelBags.it  This test is not yet approved or cleared by the [place] FDA and  has been authorized for detection and/or diagnosis of SARS-CoV-2 by  FDA under an Emergency Use Authorization (EUA). This EUA will remain  in effect (meaning this test can be used) for the duration of the  Covid-19 declaration under Section 564(b)(1) of the Act, 21  U.S.C. section 360bbb-3(b)(1), unless the authorization is  terminated or revoked. Performed at [facility], [address] [place],  [address]      Radiological Exams on Admission: DG Wrist Complete Left  Result Date: 07/22/2020 CLINICAL DATA:  Pain EXAM: LEFT WRIST - COMPLETE 3+ VIEW COMPARISON:  None. FINDINGS: There is no evidence of  fracture or dislocation. There is no evidence of arthropathy or other focal bone abnormality. Soft tissues are unremarkable. IMPRESSION: Negative. Electronically Signed   By: Constance Holster M.D.   On: 07/22/2020 18:43   CT Head Wo Contrast  Result Date: 07/22/2020 CLINICAL DATA:  Fall with positive head strike on concrete, diffuse pain, on anticoagulation EXAM: CT HEAD WITHOUT CONTRAST CT CERVICAL SPINE WITHOUT CONTRAST CT ABDOMEN AND PELVIS WITH CONTRAST TECHNIQUE: Contiguous axial images were obtained from the base of the skull through the vertex without intravenous contrast. Multidetector CT imaging of the cervical spine was performed without intravenous contrast. Multiplanar CT image reconstructions were also generated. Multidetector CT imaging of the abdomen and pelvis was performed using the standard protocol following bolus administration of intravenous contrast. CONTRAST:  150mL OMNIPAQUE IOHEXOL 300 MG/ML  SOLN COMPARISON:  MR brain 01/31/2018, CT abdomen pelvis 09/01/2018, cervical spine radiograph 05/09/2013 FINDINGS: CT HEAD FINDINGS Brain: No evidence of acute infarction, hemorrhage, hydrocephalus, extra-axial collection, visible mass lesion or mass effect. Symmetric prominence of the ventricles, cisterns and sulci compatible with parenchymal volume loss. Patchy areas of white matter hypoattenuation are most compatible with chronic microvascular angiopathy. Vascular: Atherosclerotic calcification of the carotid siphons and intradural vertebral arteries. No hyperdense vessel. Skull: No significant scalp swelling. No calvarial fracture or acute osseous injury. Sinuses/Orbits: Minimal thickening in the ethmoids. Paranasal sinuses and mastoid air cells are otherwise predominantly clear. Orbits are unremarkable. Other: None. CT CERVICAL SPINE FINDINGS Alignment: Cervical stabilization collar absent the time of examination. Mild focal reversal the normal cervical lordosis centered at C5-6 as well as  some mild anterolisthesis C3 on 4 of approximately 3 mm,, favored to be on a degenerative basis with cervical spondylitic changes at this level. No evidence of traumatic listhesis. No abnormally widened, perched or jumped facets. Normal alignment of the craniocervical and atlantoaxial articulations accounting for rightward cranial rotation. Skull base and vertebrae: No acute skull base fracture. No vertebral body fracture or height loss. The osseous structures appear diffusely demineralized which may limit detection of small or nondisplaced fractures. Photon starvation artifact seen below C5 may further  limit detection of subtle anomaly. Atlantodental arthrosis with calcific pannus formation, nonspecific though can be seen with CPPD or rheumatoid arthropathy. Multilevel spondylitic changes, as detailed below. Soft tissues and spinal canal: No pre or paravertebral fluid or swelling. No visible canal hematoma. Disc levels: Multilevel intervertebral disc height loss with spondylitic endplate changes. Larger disc osteophyte complexes present C4-5, C5-6 with some ligamentum flavum infolding result in at most mild canal stenosis best seen on the orthogonal reconstructions. Additional uncinate spurring and facet hypertrophic changes present throughout the cervical spine resulting in mild-to-moderate neural foraminal narrowing diffusely more moderate to severe C4-C6. Upper chest: No acute abnormality in the upper chest or imaged lung apices. Other: Cervical carotid atherosclerosis. No worrisome thyroid nodules. CT ABDOMEN AND PELVIS FINDINGS Lower chest: Atelectatic changes present in the lung bases. Some additional scarring and/or subsegmental atelectasis seen in the right middle lobe and lingula. 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 Cardiac size is top normal. Three-vessel coronary artery atherosclerosis. No pericardial  effusion. Hepatobiliary: No direct hepatic injury or perihepatic hematoma. No worrisome focal liver lesions. Smooth liver surface contour. Normal hepatic attenuation. Patient is post cholecystectomy. Prominence of the extrahepatic biliary tree with the common bile duct measuring up to 16 mm in diameter which is unchanged from prior and likely related to a combination of senescent change and post cholecystectomy reservoir effect. Pancreas: No pancreatic contusive change or ductal disruption. Mild pancreatic atrophy. No pancreatic ductal dilatation or surrounding inflammatory changes. Spleen: No acute splenic injury or perisplenic hemorrhage. Geographic region of hyperenhancement seen in the spleen with some increasingly cystic appearance centrally, measuring up to 2.4 cm, previously 1.8 cm on comparison contrast enhanced CT from 2018. No other focal splenic lesion. Normal splenic size. Adrenals/Urinary Tract: Nodular thickening of the adrenal glands is similar to comparisons. Bilateral renal cortical scarring is again noted. Kidneys enhance and excrete symmetrically. No direct renal injury or perinephric hemorrhage. No extravasation of contrast on excretory delayed phase imaging. Large right renal staghorn calculus with some mild urothelial thickening is overall similar to comparison study which appears at most only partially obstructive given lack of calyceal dilatation. No new urolithiasis or frank hydronephrosis is seen. Urinary bladder is unremarkable. Stomach/Bowel: Mild edematous thickening of the distal thoracic esophagus. Prominence of gastric rugal likely related to underdistention. Duodenum is unremarkable with normal sweep across the midline abdomen. No small bowel thickening or dilatation. Cecum displaced to the midline abdomen. 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 No resulting mechanical obstruction  or evidence of vascular compromise of the bowel loop. Only minimal hazy stranding of the herniated fat contents, certainly decreased from comparison. No distal colonic thickening or dilatation. Scattered colonic diverticula without focal inflammation to suggest diverticulitis. Vascular/Lymphatic: Atherosclerotic calcifications within the abdominal aorta and branch vessels. No aneurysm or ectasia. No enlarged abdominopelvic lymph nodes. Reproductive: Normal appearance of the uterus and adnexal structures. Other: Nonspecific lobular low-attenuation collection in the inferior aspect of the right pericolic gutter measuring up to 10 Hounsfield units (2/65 and measuring 2.2 x 2.7 x 2.8 cm in size (2/65, 6/41) is essentially unchanged from comparison in 2018. No abdominopelvic free air or fluid. Mild body wall edema. 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 Tiny fat containing umbilical hernia. No other bowel containing hernias. Musculoskeletal: Severe dextrocurvature of the lumbar spine with an apex at L2 and  stable severe multilevel discogenic and facet degenerative change with lateral compression deformities of the spine, as seen previously. No acute traumatic osseous abnormality. Bones of the pelvis are intact and congruent. Some remote posterolateral lower thoracic left rib deformities are unchanged from prior. IMPRESSION: CT head: 1. No acute intracranial abnormality. No significant scalp swelling or calvarial fracture. 2. Chronic microvascular ischemic changes, parenchymal volume loss and intracranial atherosclerosis. CT cervical spine: 1. No acute fracture or traumatic listhesis of the cervical spine. 2. Multilevel degenerative changes of the cervical spine as described above. CT abdomen and pelvis: 1. No acute traumatic injury of the abdomen or pelvis. 2. Large right renal staghorn calculus with some mild urothelial thickening is overall similar to comparison study which appears at most only partially  obstructive given lack of calyceal dilatation. Could correlate with features of urinalysis to exclude superimposed urinary tract infection. 3. Geographic region of hyperenhancement in the spleen with some increasingly cystic appearance centrally, measuring up to 2.4 cm in size, previously 1.8 cm on comparison contrast enhanced CT from 2018. Findings are favored to represent a benign process such as a hemangioma or lymphangioma though could be further evaluated with 6 month follow-up MRI given some slow interval change in imaging characteristics this recommendation follows ACR consensus guidelines: White Paper of the ACR Incidental Findings Committee II on Splenic and Nodal Findings. J Am Coll Radiol [phone]. 4. Nonspecific lobular low-attenuation collection in the inferior aspect of the right pericolic gutter measuring up to 2.7 cm in size, essentially unchanged from comparison in comparison in 2018. Appearance and stability favors a benign etiology such as a seroma or lymphocele. 5. 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 No resulting mechanical obstruction or evidence of vascular compromise of the bowel loop. Some mild stranding in the herniated fat contents could reflect residual fat strangulation though is decreased from comparison. 6. Colonic diverticulosis without evidence of diverticulitis. 7. Mild body wall edema. 8. Aortic Atherosclerosis (ICD10-I70.0). Electronically Signed   By: Lovena Le M.D.   On: 07/22/2020 20:01   CT Cervical Spine Wo Contrast  Result Date: 07/22/2020 CLINICAL DATA:  Fall with positive head strike on concrete, diffuse pain, on anticoagulation EXAM: CT HEAD WITHOUT CONTRAST CT CERVICAL SPINE WITHOUT CONTRAST CT ABDOMEN AND PELVIS WITH CONTRAST TECHNIQUE: Contiguous axial images were obtained from the base of the skull through the vertex without intravenous contrast. Multidetector CT  imaging of the cervical spine was performed without intravenous contrast. Multiplanar CT image reconstructions were also generated. Multidetector CT imaging of the abdomen and pelvis was performed using the standard protocol following bolus administration of intravenous contrast. CONTRAST:  126mL OMNIPAQUE IOHEXOL 300 MG/ML  SOLN COMPARISON:  MR brain 01/31/2018, CT abdomen pelvis 09/01/2018, cervical spine radiograph 05/09/2013 FINDINGS: CT HEAD FINDINGS Brain: No evidence of acute infarction, hemorrhage, hydrocephalus, extra-axial collection, visible mass lesion or mass effect. Symmetric prominence of the ventricles, cisterns and sulci compatible with parenchymal volume loss. Patchy areas of white matter hypoattenuation are most compatible with chronic microvascular angiopathy. Vascular: Atherosclerotic calcification of the carotid siphons and intradural vertebral arteries. No hyperdense vessel. Skull: No significant scalp swelling. No calvarial fracture or acute osseous injury. Sinuses/Orbits: Minimal thickening in the ethmoids. Paranasal sinuses and mastoid air cells are otherwise predominantly clear. Orbits are unremarkable. Other: None. CT CERVICAL SPINE FINDINGS Alignment: Cervical stabilization collar absent the time of examination. Mild focal reversal the normal cervical lordosis centered at C5-6 as well as  some mild anterolisthesis C3 on 4 of approximately 3 mm,, favored to be on a degenerative basis with cervical spondylitic changes at this level. No evidence of traumatic listhesis. No abnormally widened, perched or jumped facets. Normal alignment of the craniocervical and atlantoaxial articulations accounting for rightward cranial rotation. Skull base and vertebrae: No acute skull base fracture. No vertebral body fracture or height loss. The osseous structures appear diffusely demineralized which may limit detection of small or nondisplaced fractures. Photon starvation artifact seen below C5 may further  limit detection of subtle anomaly. Atlantodental arthrosis with calcific pannus formation, nonspecific though can be seen with CPPD or rheumatoid arthropathy. Multilevel spondylitic changes, as detailed below. Soft tissues and spinal canal: No pre or paravertebral fluid or swelling. No visible canal hematoma. Disc levels: Multilevel intervertebral disc height loss with spondylitic endplate changes. Larger disc osteophyte complexes present C4-5, C5-6 with some ligamentum flavum infolding result in at most mild canal stenosis best seen on the orthogonal reconstructions. Additional uncinate spurring and facet hypertrophic changes present throughout the cervical spine resulting in mild-to-moderate neural foraminal narrowing diffusely more moderate to severe C4-C6. Upper chest: No acute abnormality in the upper chest or imaged lung apices. Other: Cervical carotid atherosclerosis. No worrisome thyroid nodules. CT ABDOMEN AND PELVIS FINDINGS Lower chest: Atelectatic changes present in the lung bases. Some additional scarring and/or subsegmental atelectasis seen in the right middle lobe and lingula. 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 Cardiac size is top normal. Three-vessel coronary artery atherosclerosis. No pericardial effusion. Hepatobiliary: No direct hepatic injury or perihepatic hematoma. No worrisome focal liver lesions. Smooth liver surface contour. Normal hepatic attenuation. Patient is post cholecystectomy. Prominence of the extrahepatic biliary tree with the common bile duct measuring up to 16 mm in diameter which is unchanged from prior and likely related to a combination of senescent change and post cholecystectomy reservoir effect. Pancreas: No pancreatic contusive change or ductal disruption. Mild pancreatic atrophy. No pancreatic ductal dilatation or surrounding inflammatory changes. Spleen: No acute splenic  injury or perisplenic hemorrhage. Geographic region of hyperenhancement seen in the spleen with some increasingly cystic appearance centrally, measuring up to 2.4 cm, previously 1.8 cm on comparison contrast enhanced CT from 2018. No other focal splenic lesion. Normal splenic size. Adrenals/Urinary Tract: Nodular thickening of the adrenal glands is similar to comparisons. Bilateral renal cortical scarring is again noted. Kidneys enhance and excrete symmetrically. No direct renal injury or perinephric hemorrhage. No extravasation of contrast on excretory delayed phase imaging. Large right renal staghorn calculus with some mild urothelial thickening is overall similar to comparison study which appears at most only partially obstructive given lack of calyceal dilatation. No new urolithiasis or frank hydronephrosis is seen. Urinary bladder is unremarkable. Stomach/Bowel: Mild edematous thickening of the distal thoracic esophagus. Prominence of gastric rugal likely related to underdistention. Duodenum is unremarkable with normal sweep across the midline abdomen. No small bowel thickening or dilatation. Cecum displaced to the midline abdomen. 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 No resulting mechanical obstruction or evidence of vascular compromise of the bowel loop. Only minimal hazy stranding of the herniated fat contents, certainly decreased from comparison. No distal colonic thickening or dilatation. Scattered colonic diverticula without focal inflammation to suggest diverticulitis. Vascular/Lymphatic: Atherosclerotic calcifications within the abdominal aorta and branch vessels. No aneurysm or ectasia. No enlarged abdominopelvic lymph nodes. Reproductive: Normal  appearance of the uterus and adnexal structures. Other: Nonspecific lobular low-attenuation collection in the inferior aspect of the right pericolic gutter  measuring up to 10 Hounsfield units (2/65 and measuring 2.2 x 2.7 x 2.8 cm in size (2/65, 6/41) is essentially unchanged from comparison in 2018. No abdominopelvic free air or fluid. Mild body wall edema. 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 Tiny fat containing umbilical hernia. No other bowel containing hernias. 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 No acute traumatic osseous abnormality. Bones of the pelvis are intact and congruent. Some remote posterolateral lower thoracic left rib deformities are unchanged from prior. IMPRESSION: CT head: 1. No acute intracranial abnormality. No significant scalp swelling or calvarial fracture. 2. Chronic microvascular ischemic changes, parenchymal volume loss and intracranial atherosclerosis. CT cervical spine: 1. No acute fracture or traumatic listhesis of the cervical spine. 2. Multilevel degenerative changes of the cervical spine as described above. CT abdomen and pelvis: 1. No acute traumatic injury of the abdomen or pelvis. 2. Large right renal staghorn calculus with some mild urothelial thickening is overall similar to comparison study which appears at most only partially obstructive given lack of calyceal dilatation. Could correlate with features of urinalysis to exclude superimposed urinary tract infection. 3. Geographic region of hyperenhancement in the spleen with some increasingly cystic appearance centrally, measuring up to 2.4 cm in size, previously 1.8 cm on comparison contrast enhanced CT from 2018. Findings are favored to represent a benign process such as a hemangioma or lymphangioma though could be further evaluated with 6 month follow-up MRI given some slow interval change in imaging characteristics this recommendation follows ACR consensus guidelines: White Paper of  the ACR Incidental Findings Committee II on Splenic and Nodal Findings. J Am Coll Radiol [phone]. 4. Nonspecific lobular low-attenuation collection in the inferior aspect of the right pericolic gutter measuring up to 2.7 cm in size, essentially unchanged from comparison in comparison in 2018. Appearance and stability favors a benign etiology such as a seroma or lymphocele. 5. 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 No resulting mechanical obstruction or evidence of vascular compromise of the bowel loop. Some mild stranding in the herniated fat contents could reflect residual fat strangulation though is decreased from comparison. 6. Colonic diverticulosis without evidence of diverticulitis. 7. Mild body wall edema. 8. Aortic Atherosclerosis (ICD10-I70.0). Electronically Signed   By: Lovena Le M.D.   On: 07/22/2020 20:01   CT ABDOMEN PELVIS W CONTRAST  Result Date: 07/22/2020 CLINICAL DATA:  Fall with positive head strike on concrete, diffuse pain, on anticoagulation EXAM: CT HEAD WITHOUT CONTRAST CT CERVICAL SPINE WITHOUT CONTRAST CT ABDOMEN AND PELVIS WITH CONTRAST TECHNIQUE: Contiguous axial images were obtained from the base of the skull through the vertex without intravenous contrast. Multidetector CT imaging of the cervical spine was performed without intravenous contrast. Multiplanar CT image reconstructions were also generated. Multidetector CT imaging of the abdomen and pelvis was performed using the standard protocol following bolus administration of intravenous contrast. CONTRAST:  161mL OMNIPAQUE IOHEXOL 300 MG/ML  SOLN COMPARISON:  MR brain 01/31/2018, CT abdomen pelvis 09/01/2018, cervical spine radiograph 05/09/2013 FINDINGS: CT HEAD FINDINGS Brain: No evidence of acute infarction, hemorrhage, hydrocephalus, extra-axial collection, visible mass lesion or mass effect. Symmetric prominence of the ventricles,  cisterns and sulci compatible with  parenchymal volume loss. Patchy areas of white matter hypoattenuation are most compatible with chronic microvascular angiopathy. Vascular: Atherosclerotic calcification of the carotid siphons and intradural vertebral arteries. No hyperdense vessel. Skull: No significant scalp swelling. No calvarial fracture or acute osseous injury. Sinuses/Orbits: Minimal thickening in the ethmoids. Paranasal sinuses and mastoid air cells are otherwise predominantly clear. Orbits are unremarkable. Other: None. CT CERVICAL SPINE FINDINGS Alignment: Cervical stabilization collar absent the time of examination. Mild focal reversal the normal cervical lordosis centered at C5-6 as well as some mild anterolisthesis C3 on 4 of approximately 3 mm,, favored to be on a degenerative basis with cervical spondylitic changes at this level. No evidence of traumatic listhesis. No abnormally widened, perched or jumped facets. Normal alignment of the craniocervical and atlantoaxial articulations accounting for rightward cranial rotation. Skull base and vertebrae: No acute skull base fracture. No vertebral body fracture or height loss. The osseous structures appear diffusely demineralized which may limit detection of small or nondisplaced fractures. Photon starvation artifact seen below C5 may further limit detection of subtle anomaly. Atlantodental arthrosis with calcific pannus formation, nonspecific though can be seen with CPPD or rheumatoid arthropathy. Multilevel spondylitic changes, as detailed below. Soft tissues and spinal canal: No pre or paravertebral fluid or swelling. No visible canal hematoma. Disc levels: Multilevel intervertebral disc height loss with spondylitic endplate changes. Larger disc osteophyte complexes present C4-5, C5-6 with some ligamentum flavum infolding result in at most mild canal stenosis best seen on the orthogonal reconstructions. Additional uncinate spurring and facet hypertrophic  changes present throughout the cervical spine resulting in mild-to-moderate neural foraminal narrowing diffusely more moderate to severe C4-C6. Upper chest: No acute abnormality in the upper chest or imaged lung apices. Other: Cervical carotid atherosclerosis. No worrisome thyroid nodules. CT ABDOMEN AND PELVIS FINDINGS Lower chest: Atelectatic changes present in the lung bases. Some additional scarring and/or subsegmental atelectasis seen in the right middle lobe and lingula. 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 Cardiac size is top normal. Three-vessel coronary artery atherosclerosis. No pericardial effusion. Hepatobiliary: No direct hepatic injury or perihepatic hematoma. No worrisome focal liver lesions. Smooth liver surface contour. Normal hepatic attenuation. Patient is post cholecystectomy. Prominence of the extrahepatic biliary tree with the common bile duct measuring up to 16 mm in diameter which is unchanged from prior and likely related to a combination of senescent change and post cholecystectomy reservoir effect. Pancreas: No pancreatic contusive change or ductal disruption. Mild pancreatic atrophy. No pancreatic ductal dilatation or surrounding inflammatory changes. Spleen: No acute splenic injury or perisplenic hemorrhage. Geographic region of hyperenhancement seen in the spleen with some increasingly cystic appearance centrally, measuring up to 2.4 cm, previously 1.8 cm on comparison contrast enhanced CT from 2018. No other focal splenic lesion. Normal splenic size. Adrenals/Urinary Tract: Nodular thickening of the adrenal glands is similar to comparisons. Bilateral renal cortical scarring is again noted. Kidneys enhance and excrete symmetrically. No direct renal injury or perinephric hemorrhage. No extravasation of contrast on excretory delayed phase imaging. Large right renal staghorn calculus with some  mild urothelial thickening is overall similar to comparison study which appears at most only partially obstructive given lack of calyceal dilatation. No new urolithiasis or frank hydronephrosis is seen. Urinary bladder is unremarkable. Stomach/Bowel: Mild edematous thickening of the distal thoracic esophagus. Prominence of gastric rugal likely related to underdistention. Duodenum is unremarkable with normal sweep across the midline abdomen. No small bowel thickening or dilatation.  Cecum displaced to the midline abdomen. 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 No resulting mechanical obstruction or evidence of vascular compromise of the bowel loop. Only minimal hazy stranding of the herniated fat contents, certainly decreased from comparison. No distal colonic thickening or dilatation. Scattered colonic diverticula without focal inflammation to suggest diverticulitis. Vascular/Lymphatic: Atherosclerotic calcifications within the abdominal aorta and branch vessels. No aneurysm or ectasia. No enlarged abdominopelvic lymph nodes. Reproductive: Normal appearance of the uterus and adnexal structures. Other: Nonspecific lobular low-attenuation collection in the inferior aspect of the right pericolic gutter measuring up to 10 Hounsfield units (2/65 and measuring 2.2 x 2.7 x 2.8 cm in size (2/65, 6/41) is essentially unchanged from comparison in 2018. No abdominopelvic free air or fluid. Mild body wall edema. 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 Tiny fat containing umbilical hernia. No other bowel containing hernias. 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 No acute traumatic osseous abnormality. Bones of the pelvis are intact and  congruent. Some remote posterolateral lower thoracic left rib deformities are unchanged from prior. IMPRESSION: CT head: 1. No acute intracranial abnormality. No significant scalp swelling or calvarial fracture. 2. Chronic microvascular ischemic changes, parenchymal volume loss and intracranial atherosclerosis. CT cervical spine: 1. No acute fracture or traumatic listhesis of the cervical spine. 2. Multilevel degenerative changes of the cervical spine as described above. CT abdomen and pelvis: 1. No acute traumatic injury of the abdomen or pelvis. 2. Large right renal staghorn calculus with some mild urothelial thickening is overall similar to comparison study which appears at most only partially obstructive given lack of calyceal dilatation. Could correlate with features of urinalysis to exclude superimposed urinary tract infection. 3. Geographic region of hyperenhancement in the spleen with some increasingly cystic appearance centrally, measuring up to 2.4 cm in size, previously 1.8 cm on comparison contrast enhanced CT from 2018. Findings are favored to represent a benign process such as a hemangioma or lymphangioma though could be further evaluated with 6 month follow-up MRI given some slow interval change in imaging characteristics this recommendation follows ACR consensus guidelines: White Paper of the ACR Incidental Findings Committee II on Splenic and Nodal Findings. J Am Coll Radiol [phone]. 4. Nonspecific lobular low-attenuation collection in the inferior aspect of the right pericolic gutter measuring up to 2.7 cm in size, essentially unchanged from comparison in comparison in 2018. Appearance and stability favors a benign etiology such as a seroma or lymphocele. 5. 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 No resulting mechanical obstruction or evidence of vascular compromise of the bowel loop. Some mild stranding in  the herniated fat contents could reflect residual fat strangulation though is decreased from comparison. 6. Colonic diverticulosis without evidence of diverticulitis. 7. Mild body wall edema. 8. Aortic Atherosclerosis (ICD10-I70.0). Electronically Signed   By: Lovena Le M.D.   On: 07/22/2020 20:01   DG Knee Complete 4 Views Right  Result Date: 07/22/2020 CLINICAL DATA:  Pain status post fall EXAM: RIGHT KNEE - COMPLETE 4+ VIEW COMPARISON:  April 03, 2012 FINDINGS: There is an acute periprosthetic fracture involving the supracondylar distal right femur most notably on the lateral aspect of the femur. There is a large joint effusion. There is an  apparent new osteophyte arising from the upper pole of the patella. There is a subtle lucency coursing through this osteophyte suggestive of a nondisplaced fracture. There is soft tissue swelling about the knee. Vascular calcifications are noted. IMPRESSION: 1. Acute periprosthetic fracture involving the supracondylar distal right femur. 2. Lucency coursing through the osteophyte arising from the upper pole of the patella suggestive of a nondisplaced fracture. 3. Large joint effusion. 4. Soft tissue swelling about the knee. 5. Vascular calcifications. Electronically Signed   By: Constance Holster M.D.   On: 07/22/2020 18:42    EKG: Independently reviewed.  Atrial fibrillation with controlled rate  Assessment/Plan Principal Problem:   Fall Active Problems:   Atrial fibrillation ([place])   Essential hypertension   Dementia ([facility])   Hyperlipidemia   Primary osteoarthritis involving multiple joints   Displaced apophyseal fracture of right femur, initial encounter for closed fracture ([place])     #1 status post fall: Patient is having recurrent falls.  She probably has gait abnormalities.  She seemed to have good control of her faculties.  At this point will admit the patient.  Get PT OT evaluation.  She might require placement.  I will hold anticoagulation in the  setting of recurrent fall since she has the risk of internal bleed.  #2  Right periprosthetic fracture of the right knee: Patient will be admitted.  Immobilization of the knee.  Orthopedic consultation.  Patient will likely require nonweightbearing for a while while conservative measures are continued with.  #3 atrial fibrillation: Rate is currently controlled.  I will hold Eliquis due to fall risk.  #4 dementia: Stable.  Patient seem to be having good control of her faculties at this point not confused.  #5 essential hypertension: We will confirm and continue home regimen.  #6 osteoarthritis of multiple joints: Continue home regimen.  #7 hyperlipidemia: Confirm on resume home regimen.   DVT prophylaxis: Lovenox Code Status: Full code Family Communication: No family at bedside Disposition Plan: Most likely rehab Consults called: Consult orthopedics Admission status: Inpatient  Severity of Illness: The appropriate patient status for this patient is INPATIENT. Inpatient status is judged to be reasonable and necessary in order to provide the required intensity of service to ensure the patient's safety. The patient's presenting symptoms, physical exam findings, and initial radiographic and laboratory data in the context of their chronic comorbidities is felt to place them at high risk for further clinical deterioration. Furthermore, it is not anticipated that the patient will be medically stable for discharge from the hospital within 2 midnights of admission. The following factors support the patient status of inpatient.   " The patient's presenting symptoms include fall with right knee pain. " The worrisome physical exam findings include right knee swollen tender. " The initial radiographic and laboratory data are worrisome because of fracture of the distal right femur periprosthetic. " The chronic co-morbidities include dementia and A. fib.   * I certify that at the point of admission it  is my clinical judgment that the patient will require inpatient hospital care spanning beyond 2 midnights from the point of admission due to high intensity of service, high risk for further deterioration and high frequency of surveillance required.Barbette Merino MD [facility] Pager [phone]  If 7PM-7AM, please contact night-coverage [email or web address] Password [facility]  07/22/2020, 10:17 PM

## 2020-07-22 NOTE — ED Provider Notes (Signed)
[facility] Emergency Department Provider Note  ____________________________________________   First MD Initiated Contact with Patient 07/22/20 1744     (approximate)  I have reviewed the triage vital signs and the nursing notes.   HISTORY  Chief Complaint Fall  HPI Regina Mathis is a 79 y.o. female with past medical history significant for A. fib, hypertension, and dementia who presents to the emergency department for evaluation following a fall.  The patient has had 2 falls today and per EMS report has been falling frequently.  When asked about her last fall prior to today, she is unable to give an amount of time.  The nature of her falls today is somewhat unclear, as the patient gives a different recall of the events when asked different times.  On one account, the patient states that her first fall today was that she was getting out of the car and fell striking her right knee.  She then states that she was walking with 2 canes because she felt unsteady trying to get to the bathroom and fell.  Initially she denied hitting her head and later endorsed it on a separate occasion.  Her primary complaint at this time is right knee pain and swelling.  She denies any dizziness, denies loss of consciousness following the falls.         Past Medical History:  Diagnosis Date  . GERD (gastroesophageal reflux disease)   . Hyperlipidemia   . Hypertension   . Osteoarthritis   . Pre-diabetes   . Scoliosis deformity of spine   . Valvular heart disease    Echocardiogram 2014- moderate mitral regurg, moderate tricuspid regurg, moderate dilated left atrium and right atrium, EF 50-55%    Patient Active Problem List   Diagnosis Date Noted  . Fall 07/22/2020  . Displaced apophyseal fracture of right femur, initial encounter for closed fracture ([place]) 07/22/2020  . Bursitis 04/10/2020  . Plantarflexion deformity of left foot 04/10/2020  . 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  Nontraumatic complete tear of left rotator cuff 05/01/2018  . Rotator cuff arthropathy of left shoulder 05/01/2018  . Bruising 08/04/2017  . Bilateral leg edema 04/21/2017  . Atrial fibrillation ([place]) 03/28/2017  . Essential hypertension 03/28/2017  . Dementia ([place]) 03/28/2017  . Hyperlipidemia 03/28/2017  . Preventative health care 03/28/2017  . Primary osteoarthritis involving multiple joints 11/20/2015    Past Surgical History:  Procedure Laterality Date  . CATARACT EXTRACTION W/ INTRAOCULAR LENS  IMPLANT, BILATERAL    . CHOLECYSTECTOMY    . COLONOSCOPY WITH PROPOFOL N/A 01/16/2016   Procedure: COLONOSCOPY WITH PROPOFOL;  Surgeon: [name], MD;  Location: [facility] ENDOSCOPY;  Service: Endoscopy;  Laterality: N/A;  . HERNIA REPAIR     with Bowel Involvement (04/2012)  . HYSTEROSCOPY WITH D & C N/A 01/14/2017   Procedure: DILATATION AND CURETTAGE /HYSTEROSCOPY;  Surgeon: Honor Loh Ward, MD;  Location: [facility] ORS;  Service: Gynecology;  Laterality: N/A;  . 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 Lumpectomy Chest Area    . TOTAL KNEE ARTHROPLASTY Bilateral     Prior to Admission medications   Medication Sig Start Date End Date Taking? Authorizing Provider  apixaban (ELIQUIS) 5 MG TABS tablet Take by mouth. 05/08/20   [provider]  Ascorbic Acid (VITAMIN C) 1000 MG tablet Take 1,000 mg by mouth daily.     [provider]  Cholecalciferol (VITAMIN D3) 2000 units TABS Take 2,000  Units by mouth daily. 03/31/17   Coral Spikes, DO  ciprofloxacin (CIPRO) 500 MG tablet ciprofloxacin 500 mg tablet    [provider]  citalopram (CELEXA) 10 MG tablet  04/19/19   [provider]  donepezil (ARICEPT) 10 MG tablet Take by mouth. 04/10/19   [provider]  furosemide (LASIX) 20 MG tablet furosemide 20 mg tablet    [provider]  HYDROcodone-acetaminophen (NORCO) 5-325 MG tablet Norco 5 mg-325 mg  tablet  Take 1- 2  tablet(s) EVERY 4 - 6 HOURS by oral route. PRN PAIN    [provider]  lisinopril-hydrochlorothiazide (PRINZIDE,ZESTORETIC) 20-25 MG tablet lisinopril 20 mg-hydrochlorothiazide 25 mg tablet    [provider]  memantine (NAMENDA) 5 MG tablet  06/12/17   [provider]  mirabegron ER (MYRBETRIQ) 25 MG TB24 tablet Take 1 tablet (25 mg total) by mouth daily. 09/06/18   Zara Council A, PA-C  omeprazole (PRILOSEC) 20 MG capsule  01/12/19   [provider]  potassium chloride (K-DUR) 10 MEQ tablet Take 10 mEq by mouth daily. 03/12/19   [provider]  Probiotic Product (PROBIOTIC-10) CHEW Chew 1 tablet by mouth 2 (two) times daily. 03/31/17   Coral Spikes, DO  ranitidine (ZANTAC) 150 MG tablet Take 150 mg by mouth at bedtime.  03/11/17   [provider]  simvastatin (ZOCOR) 40 MG tablet  12/22/18   [provider]  tiZANidine (ZANAFLEX) 2 MG tablet  01/29/19   [provider]    Allergies Other  Family History  Problem Relation Age of Onset  . CAD Mother   . Diabetes Mellitus II Mother   . Stroke Mother   . Lung cancer Father   . Diabetes Mellitus II Sister   . Diabetes Mellitus II Brother   . Breast cancer Neg Hx   . Bladder Cancer Neg Hx   . Kidney cancer Neg Hx     Social History Social History   Tobacco Use  . Smoking status: Never Smoker  . Smokeless tobacco: Never Used  Vaping Use  . Vaping Use: Never used  Substance Use Topics  . Alcohol use: No    Alcohol/week: 0.0 standard drinks  . Drug use: No    Review of Systems Constitutional: No fever/chills Eyes: No visual changes. ENT: No sore throat. Cardiovascular: Denies chest pain. Respiratory: Denies shortness of breath. Gastrointestinal: No abdominal pain.  No nausea, no vomiting.  No diarrhea.  No constipation. Genitourinary: Negative for dysuria. Musculoskeletal: + Right knee pain, + right hip pain, + left wrist pain Skin: +  Wounds to left wrist, negative for rash. Neurological: Negative for headaches, focal weakness or numbness.   ____________________________________________   PHYSICAL EXAM:  VITAL SIGNS: ED Triage Vitals  Enc Vitals Group     BP 07/22/20 1740 (!) 148/64     Pulse Rate 07/22/20 1740 (!) 55     Resp 07/22/20 1740 18     Temp 07/22/20 1740 97.7 F (36.5 C)     Temp Source 07/22/20 1740 Oral     SpO2 07/22/20 1740 99 %     Weight 07/22/20 1741 147 lb 11.3 oz (67 kg)     Height 07/22/20 1741 5\' 2"  (1.575 m)     Head Circumference --      Peak Flow --      Pain Score 07/22/20 1740 9     Pain Loc --      Pain Edu? --  Excl. in [place]? --     Constitutional: Alert and oriented x3. Well nourished, non-toxic appearing. Eyes: Conjunctivae are normal. PERRL. EOMI. Head: Atraumatic. Nose: Mask in place. Mouth/Throat: Mask in place. Neck: No stridor.  No tenderness to palpation of the midline or paraspinals of the cervical spine. Cardiovascular: Normal rate, regular rhythm.  Good peripheral circulation. Respiratory: Normal respiratory effort.  No retractions. Lungs CTAB. Gastrointestinal: Soft.  Tender to palpation of the right lower quadrant.  No ecchymosis. no distention. No abdominal bruits. No CVA tenderness. Musculoskeletal: Tender to palpation over the anterior aspect of the right knee.  Well-healed surgical scars present.  Distally neurovascularly intact. Neurologic:  Normal speech and language. No gross focal neurologic deficits are appreciated.  Skin: Multiple skin tears noted to the dorsal aspect of the left wrist. Psychiatric: Mood and affect are normal. Speech and behavior are normal.  ____________________________________________   LABS (all labs ordered are listed, but only abnormal results are displayed)  Labs Reviewed  CBC WITH DIFFERENTIAL/PLATELET - Abnormal; Notable for the following components:      Result Value   Neutro Abs 8.5 (*)    Lymphs Abs 0.5 (*)    All  other components within normal limits  COMPREHENSIVE METABOLIC PANEL - Abnormal; Notable for the following components:   Glucose, Bld 121 (*)    BUN 29 (*)    GFR, Estimated 59 (*)    All other components within normal limits  URINALYSIS, COMPLETE (UACMP) WITH MICROSCOPIC - Abnormal; Notable for the following components:   Color, Urine STRAW (*)    APPearance HAZY (*)    Hgb urine dipstick MODERATE (*)    Leukocytes,Ua LARGE (*)    WBC, UA >50 (*)    Bacteria, UA RARE (*)    All other components within normal limits  RESPIRATORY PANEL BY RT PCR (FLU A&B, COVID)  CBC  CREATININE, SERUM  COMPREHENSIVE METABOLIC PANEL  CBC   ____________________________________________  EKG  Normal sinus rhythm with a rate in the low 60s.  Prolonged QT noted at 450 ms.  No noted ST-T wave changes. ____________________________________________  RADIOLOGY Lenoria Farrier, personally viewed and evaluated these images (plain radiographs) as part of my medical decision making, as well as reviewing the written report by the radiologist.  ED provider interpretation: X-rays of the left wrist negative for any acute fracture.  X-rays of the right knee reveal a distal femur fracture near the site of the proximal component of the knee.  Official radiology report(s): DG Wrist Complete Left  Result Date: 07/22/2020 CLINICAL DATA:  Pain EXAM: LEFT WRIST - COMPLETE 3+ VIEW COMPARISON:  None. FINDINGS: There is no evidence of fracture or dislocation. There is no evidence of arthropathy or other focal bone abnormality. Soft tissues are unremarkable. IMPRESSION: Negative. Electronically Signed   By: Constance Holster M.D.   On: 07/22/2020 18:43   CT Head Wo Contrast  Result Date: 07/22/2020 CLINICAL DATA:  Fall with positive head strike on concrete, diffuse pain, on anticoagulation EXAM: CT HEAD WITHOUT CONTRAST CT CERVICAL SPINE WITHOUT CONTRAST CT ABDOMEN AND PELVIS WITH CONTRAST TECHNIQUE: Contiguous axial  images were obtained from the base of the skull through the vertex without intravenous contrast. Multidetector CT imaging of the cervical spine was performed without intravenous contrast. Multiplanar CT image reconstructions were also generated. Multidetector CT imaging of the abdomen and pelvis was performed using the standard protocol following bolus administration of intravenous contrast. CONTRAST:  165mL OMNIPAQUE IOHEXOL 300 MG/ML  SOLN COMPARISON:  MR brain 01/31/2018, CT abdomen pelvis 09/01/2018, cervical spine radiograph 05/09/2013 FINDINGS: CT HEAD FINDINGS Brain: No evidence of acute infarction, hemorrhage, hydrocephalus, extra-axial collection, visible mass lesion or mass effect. Symmetric prominence of the ventricles, cisterns and sulci compatible with parenchymal volume loss. Patchy areas of white matter hypoattenuation are most compatible with chronic microvascular angiopathy. Vascular: Atherosclerotic calcification of the carotid siphons and intradural vertebral arteries. No hyperdense vessel. Skull: No significant scalp swelling. No calvarial fracture or acute osseous injury. Sinuses/Orbits: Minimal thickening in the ethmoids. Paranasal sinuses and mastoid air cells are otherwise predominantly clear. Orbits are unremarkable. Other: None. CT CERVICAL SPINE FINDINGS Alignment: Cervical stabilization collar absent the time of examination. Mild focal reversal the normal cervical lordosis centered at C5-6 as well as some mild anterolisthesis C3 on 4 of approximately 3 mm,, favored to be on a degenerative basis with cervical spondylitic changes at this level. No evidence of traumatic listhesis. No abnormally widened, perched or jumped facets. Normal alignment of the craniocervical and atlantoaxial articulations accounting for rightward cranial rotation. Skull base and vertebrae: No acute skull base fracture. No vertebral body fracture or height loss. The osseous structures appear diffusely demineralized  which may limit detection of small or nondisplaced fractures. Photon starvation artifact seen below C5 may further limit detection of subtle anomaly. Atlantodental arthrosis with calcific pannus formation, nonspecific though can be seen with CPPD or rheumatoid arthropathy. Multilevel spondylitic changes, as detailed below. Soft tissues and spinal canal: No pre or paravertebral fluid or swelling. No visible canal hematoma. Disc levels: Multilevel intervertebral disc height loss with spondylitic endplate changes. Larger disc osteophyte complexes present C4-5, C5-6 with some ligamentum flavum infolding result in at most mild canal stenosis best seen on the orthogonal reconstructions. Additional uncinate spurring and facet hypertrophic changes present throughout the cervical spine resulting in mild-to-moderate neural foraminal narrowing diffusely more moderate to severe C4-C6. Upper chest: No acute abnormality in the upper chest or imaged lung apices. Other: Cervical carotid atherosclerosis. No worrisome thyroid nodules. CT ABDOMEN AND PELVIS FINDINGS Lower chest: Atelectatic changes present in the lung bases. Some additional scarring and/or subsegmental atelectasis seen in the right middle lobe and lingula. 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 Cardiac size is top normal. Three-vessel coronary artery atherosclerosis. No pericardial effusion. Hepatobiliary: No direct hepatic injury or perihepatic hematoma. No worrisome focal liver lesions. Smooth liver surface contour. Normal hepatic attenuation. Patient is post cholecystectomy. Prominence of the extrahepatic biliary tree with the common bile duct measuring up to 16 mm in diameter which is unchanged from prior and likely related to a combination of senescent change and post cholecystectomy reservoir effect. Pancreas: No pancreatic contusive change or ductal disruption. Mild  pancreatic atrophy. No pancreatic ductal dilatation or surrounding inflammatory changes. Spleen: No acute splenic injury or perisplenic hemorrhage. Geographic region of hyperenhancement seen in the spleen with some increasingly cystic appearance centrally, measuring up to 2.4 cm, previously 1.8 cm on comparison contrast enhanced CT from 2018. No other focal splenic lesion. Normal splenic size. Adrenals/Urinary Tract: Nodular thickening of the adrenal glands is similar to comparisons. Bilateral renal cortical scarring is again noted. Kidneys enhance and excrete symmetrically. No direct renal injury or perinephric hemorrhage. No extravasation of contrast on excretory delayed phase imaging. Large right renal staghorn calculus with some mild urothelial thickening is overall similar to comparison study which appears at most only partially obstructive given lack of calyceal dilatation. No new urolithiasis or  frank hydronephrosis is seen. Urinary bladder is unremarkable. Stomach/Bowel: Mild edematous thickening of the distal thoracic esophagus. Prominence of gastric rugal likely related to underdistention. Duodenum is unremarkable with normal sweep across the midline abdomen. No small bowel thickening or dilatation. Cecum displaced to the midline abdomen. 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 No resulting mechanical obstruction or evidence of vascular compromise of the bowel loop. Only minimal hazy stranding of the herniated fat contents, certainly decreased from comparison. No distal colonic thickening or dilatation. Scattered colonic diverticula without focal inflammation to suggest diverticulitis. Vascular/Lymphatic: Atherosclerotic calcifications within the abdominal aorta and branch vessels. No aneurysm or ectasia. No enlarged abdominopelvic lymph nodes. Reproductive: Normal appearance of the uterus and adnexal  structures. Other: Nonspecific lobular low-attenuation collection in the inferior aspect of the right pericolic gutter measuring up to 10 Hounsfield units (2/65 and measuring 2.2 x 2.7 x 2.8 cm in size (2/65, 6/41) is essentially unchanged from comparison in 2018. No abdominopelvic free air or fluid. Mild body wall edema. 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 Tiny fat containing umbilical hernia. No other bowel containing hernias. 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 No acute traumatic osseous abnormality. Bones of the pelvis are intact and congruent. Some remote posterolateral lower thoracic left rib deformities are unchanged from prior. IMPRESSION: CT head: 1. No acute intracranial abnormality. No significant scalp swelling or calvarial fracture. 2. Chronic microvascular ischemic changes, parenchymal volume loss and intracranial atherosclerosis. CT cervical spine: 1. No acute fracture or traumatic listhesis of the cervical spine. 2. Multilevel degenerative changes of the cervical spine as described above. CT abdomen and pelvis: 1. No acute traumatic injury of the abdomen or pelvis. 2. Large right renal staghorn calculus with some mild urothelial thickening is overall similar to comparison study which appears at most only partially obstructive given lack of calyceal dilatation. Could correlate with features of urinalysis to exclude superimposed urinary tract infection. 3. Geographic region of hyperenhancement in the spleen with some increasingly cystic appearance centrally, measuring up to 2.4 cm in size, previously 1.8 cm on comparison contrast enhanced CT from 2018. Findings are favored to represent a benign process such as a hemangioma or lymphangioma though could be further evaluated with 6 month follow-up MRI given  some slow interval change in imaging characteristics this recommendation follows ACR consensus guidelines: White Paper of the ACR Incidental Findings Committee II on Splenic and Nodal Findings. J Am Coll Radiol [phone]. 4. Nonspecific lobular low-attenuation collection in the inferior aspect of the right pericolic gutter measuring up to 2.7 cm in size, essentially unchanged from comparison in comparison in 2018. Appearance and stability favors a benign etiology such as a seroma or lymphocele. 5. 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 No resulting mechanical obstruction or evidence of vascular compromise of the bowel loop. Some mild stranding in the herniated fat contents could reflect residual fat strangulation though is decreased from comparison. 6. Colonic diverticulosis without evidence of diverticulitis. 7. Mild body wall edema. 8. Aortic Atherosclerosis (ICD10-I70.0). Electronically Signed   By: Lovena Le M.D.   On: 07/22/2020 20:01   CT Cervical Spine Wo Contrast  Result Date: 07/22/2020 CLINICAL DATA:  Fall with positive head strike on concrete,  diffuse pain, on anticoagulation EXAM: CT HEAD WITHOUT CONTRAST CT CERVICAL SPINE WITHOUT CONTRAST CT ABDOMEN AND PELVIS WITH CONTRAST TECHNIQUE: Contiguous axial images were obtained from the base of the skull through the vertex without intravenous contrast. Multidetector CT imaging of the cervical spine was performed without intravenous contrast. Multiplanar CT image reconstructions were also generated. Multidetector CT imaging of the abdomen and pelvis was performed using the standard protocol following bolus administration of intravenous contrast. CONTRAST:  132mL OMNIPAQUE IOHEXOL 300 MG/ML  SOLN COMPARISON:  MR brain 01/31/2018, CT abdomen pelvis 09/01/2018, cervical spine radiograph 05/09/2013 FINDINGS: CT HEAD FINDINGS Brain: No evidence of acute infarction,  hemorrhage, hydrocephalus, extra-axial collection, visible mass lesion or mass effect. Symmetric prominence of the ventricles, cisterns and sulci compatible with parenchymal volume loss. Patchy areas of white matter hypoattenuation are most compatible with chronic microvascular angiopathy. Vascular: Atherosclerotic calcification of the carotid siphons and intradural vertebral arteries. No hyperdense vessel. Skull: No significant scalp swelling. No calvarial fracture or acute osseous injury. Sinuses/Orbits: Minimal thickening in the ethmoids. Paranasal sinuses and mastoid air cells are otherwise predominantly clear. Orbits are unremarkable. Other: None. CT CERVICAL SPINE FINDINGS Alignment: Cervical stabilization collar absent the time of examination. Mild focal reversal the normal cervical lordosis centered at C5-6 as well as some mild anterolisthesis C3 on 4 of approximately 3 mm,, favored to be on a degenerative basis with cervical spondylitic changes at this level. No evidence of traumatic listhesis. No abnormally widened, perched or jumped facets. Normal alignment of the craniocervical and atlantoaxial articulations accounting for rightward cranial rotation. Skull base and vertebrae: No acute skull base fracture. No vertebral body fracture or height loss. The osseous structures appear diffusely demineralized which may limit detection of small or nondisplaced fractures. Photon starvation artifact seen below C5 may further limit detection of subtle anomaly. Atlantodental arthrosis with calcific pannus formation, nonspecific though can be seen with CPPD or rheumatoid arthropathy. Multilevel spondylitic changes, as detailed below. Soft tissues and spinal canal: No pre or paravertebral fluid or swelling. No visible canal hematoma. Disc levels: Multilevel intervertebral disc height loss with spondylitic endplate changes. Larger disc osteophyte complexes present C4-5, C5-6 with some ligamentum flavum infolding result  in at most mild canal stenosis best seen on the orthogonal reconstructions. Additional uncinate spurring and facet hypertrophic changes present throughout the cervical spine resulting in mild-to-moderate neural foraminal narrowing diffusely more moderate to severe C4-C6. Upper chest: No acute abnormality in the upper chest or imaged lung apices. Other: Cervical carotid atherosclerosis. No worrisome thyroid nodules. CT ABDOMEN AND PELVIS FINDINGS Lower chest: Atelectatic changes present in the lung bases. Some additional scarring and/or subsegmental atelectasis seen in the right middle lobe and lingula. 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 Cardiac size is top normal. Three-vessel coronary artery atherosclerosis. No pericardial effusion. Hepatobiliary: No direct hepatic injury or perihepatic hematoma. No worrisome focal liver lesions. Smooth liver surface contour. Normal hepatic attenuation. Patient is post cholecystectomy. Prominence of the extrahepatic biliary tree with the common bile duct measuring up to 16 mm in diameter which is unchanged from prior and likely related to a combination of senescent change and post cholecystectomy reservoir effect. Pancreas: No pancreatic contusive change or ductal disruption. Mild pancreatic atrophy. No pancreatic ductal dilatation or surrounding inflammatory changes. Spleen: No acute splenic injury or perisplenic hemorrhage. Geographic region of hyperenhancement seen in the spleen with some increasingly cystic appearance centrally, measuring up to 2.4 cm, previously 1.8  cm on comparison contrast enhanced CT from 2018. No other focal splenic lesion. Normal splenic size. Adrenals/Urinary Tract: Nodular thickening of the adrenal glands is similar to comparisons. Bilateral renal cortical scarring is again noted. Kidneys enhance and excrete symmetrically. No direct renal injury or  perinephric hemorrhage. No extravasation of contrast on excretory delayed phase imaging. Large right renal staghorn calculus with some mild urothelial thickening is overall similar to comparison study which appears at most only partially obstructive given lack of calyceal dilatation. No new urolithiasis or frank hydronephrosis is seen. Urinary bladder is unremarkable. Stomach/Bowel: Mild edematous thickening of the distal thoracic esophagus. Prominence of gastric rugal likely related to underdistention. Duodenum is unremarkable with normal sweep across the midline abdomen. No small bowel thickening or dilatation. Cecum displaced to the midline abdomen. 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 No resulting mechanical obstruction or evidence of vascular compromise of the bowel loop. Only minimal hazy stranding of the herniated fat contents, certainly decreased from comparison. No distal colonic thickening or dilatation. Scattered colonic diverticula without focal inflammation to suggest diverticulitis. Vascular/Lymphatic: Atherosclerotic calcifications within the abdominal aorta and branch vessels. No aneurysm or ectasia. No enlarged abdominopelvic lymph nodes. Reproductive: Normal appearance of the uterus and adnexal structures. Other: Nonspecific lobular low-attenuation collection in the inferior aspect of the right pericolic gutter measuring up to 10 Hounsfield units (2/65 and measuring 2.2 x 2.7 x 2.8 cm in size (2/65, 6/41) is essentially unchanged from comparison in 2018. No abdominopelvic free air or fluid. Mild body wall edema. 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 Tiny fat containing umbilical hernia. No other bowel containing hernias. Musculoskeletal: Severe dextrocurvature of the lumbar spine with an apex at L2 and stable severe multilevel discogenic and facet degenerative change with lateral  compression deformities of the spine, as seen previously. No acute traumatic osseous abnormality. Bones of the pelvis are intact and congruent. Some remote posterolateral lower thoracic left rib deformities are unchanged from prior. IMPRESSION: CT head: 1. No acute intracranial abnormality. No significant scalp swelling or calvarial fracture. 2. Chronic microvascular ischemic changes, parenchymal volume loss and intracranial atherosclerosis. CT cervical spine: 1. No acute fracture or traumatic listhesis of the cervical spine. 2. Multilevel degenerative changes of the cervical spine as described above. CT abdomen and pelvis: 1. No acute traumatic injury of the abdomen or pelvis. 2. Large right renal staghorn calculus with some mild urothelial thickening is overall similar to comparison study which appears at most only partially obstructive given lack of calyceal dilatation. Could correlate with features of urinalysis to exclude superimposed urinary tract infection. 3. Geographic region of hyperenhancement in the spleen with some increasingly cystic appearance centrally, measuring up to 2.4 cm in size, previously 1.8 cm on comparison contrast enhanced CT from 2018. Findings are favored to represent a benign process such as a hemangioma or lymphangioma though could be further evaluated with 6 month follow-up MRI given some slow interval change in imaging characteristics this recommendation follows ACR consensus guidelines: White Paper of the ACR Incidental Findings Committee II on Splenic and Nodal Findings. J Am Coll Radiol [phone]. 4. Nonspecific lobular low-attenuation collection in the inferior aspect of the right pericolic gutter measuring up to 2.7 cm in size, essentially unchanged from comparison in comparison in 2018. Appearance and stability favors a benign etiology such as a seroma or lymphocele. 5. Portion of the ascending and transverse colon interposed anterior to the liver  protruding through a  right Morgagni hernia. No resulting mechanical obstruction or evidence of vascular compromise of the bowel loop. Some mild stranding in the herniated fat contents could reflect residual fat strangulation though is decreased from comparison. 6. Colonic diverticulosis without evidence of diverticulitis. 7. Mild body wall edema. 8. Aortic Atherosclerosis (ICD10-I70.0). Electronically Signed   By: Lovena Le M.D.   On: 07/22/2020 20:01   CT ABDOMEN PELVIS W CONTRAST  Result Date: 07/22/2020 CLINICAL DATA:  Fall with positive head strike on concrete, diffuse pain, on anticoagulation EXAM: CT HEAD WITHOUT CONTRAST CT CERVICAL SPINE WITHOUT CONTRAST CT ABDOMEN AND PELVIS WITH CONTRAST TECHNIQUE: Contiguous axial images were obtained from the base of the skull through the vertex without intravenous contrast. Multidetector CT imaging of the cervical spine was performed without intravenous contrast. Multiplanar CT image reconstructions were also generated. Multidetector CT imaging of the abdomen and pelvis was performed using the standard protocol following bolus administration of intravenous contrast. CONTRAST:  126mL OMNIPAQUE IOHEXOL 300 MG/ML  SOLN COMPARISON:  MR brain 01/31/2018, CT abdomen pelvis 09/01/2018, cervical spine radiograph 05/09/2013 FINDINGS: CT HEAD FINDINGS Brain: No evidence of acute infarction, hemorrhage, hydrocephalus, extra-axial collection, visible mass lesion or mass effect. Symmetric prominence of the ventricles, cisterns and sulci compatible with parenchymal volume loss. Patchy areas of white matter hypoattenuation are most compatible with chronic microvascular angiopathy. Vascular: Atherosclerotic calcification of the carotid siphons and intradural vertebral arteries. No hyperdense vessel. Skull: No significant scalp swelling. No calvarial fracture or acute osseous injury. Sinuses/Orbits: Minimal thickening in the ethmoids. Paranasal sinuses and mastoid air cells are otherwise  predominantly clear. Orbits are unremarkable. Other: None. CT CERVICAL SPINE FINDINGS Alignment: Cervical stabilization collar absent the time of examination. Mild focal reversal the normal cervical lordosis centered at C5-6 as well as some mild anterolisthesis C3 on 4 of approximately 3 mm,, favored to be on a degenerative basis with cervical spondylitic changes at this level. No evidence of traumatic listhesis. No abnormally widened, perched or jumped facets. Normal alignment of the craniocervical and atlantoaxial articulations accounting for rightward cranial rotation. Skull base and vertebrae: No acute skull base fracture. No vertebral body fracture or height loss. The osseous structures appear diffusely demineralized which may limit detection of small or nondisplaced fractures. Photon starvation artifact seen below C5 may further limit detection of subtle anomaly. Atlantodental arthrosis with calcific pannus formation, nonspecific though can be seen with CPPD or rheumatoid arthropathy. Multilevel spondylitic changes, as detailed below. Soft tissues and spinal canal: No pre or paravertebral fluid or swelling. No visible canal hematoma. Disc levels: Multilevel intervertebral disc height loss with spondylitic endplate changes. Larger disc osteophyte complexes present C4-5, C5-6 with some ligamentum flavum infolding result in at most mild canal stenosis best seen on the orthogonal reconstructions. Additional uncinate spurring and facet hypertrophic changes present throughout the cervical spine resulting in mild-to-moderate neural foraminal narrowing diffusely more moderate to severe C4-C6. Upper chest: No acute abnormality in the upper chest or imaged lung apices. Other: Cervical carotid atherosclerosis. No worrisome thyroid nodules. CT ABDOMEN AND PELVIS FINDINGS Lower chest: Atelectatic changes present in the lung bases. Some additional scarring and/or subsegmental atelectasis seen in the right middle lobe and  lingula. 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 Cardiac size is top normal. Three-vessel coronary artery atherosclerosis. No pericardial effusion. Hepatobiliary: No direct hepatic injury or perihepatic hematoma. No worrisome focal liver lesions. Smooth liver surface contour. Normal hepatic attenuation.  Patient is post cholecystectomy. Prominence of the extrahepatic biliary tree with the common bile duct measuring up to 16 mm in diameter which is unchanged from prior and likely related to a combination of senescent change and post cholecystectomy reservoir effect. Pancreas: No pancreatic contusive change or ductal disruption. Mild pancreatic atrophy. No pancreatic ductal dilatation or surrounding inflammatory changes. Spleen: No acute splenic injury or perisplenic hemorrhage. Geographic region of hyperenhancement seen in the spleen with some increasingly cystic appearance centrally, measuring up to 2.4 cm, previously 1.8 cm on comparison contrast enhanced CT from 2018. No other focal splenic lesion. Normal splenic size. Adrenals/Urinary Tract: Nodular thickening of the adrenal glands is similar to comparisons. Bilateral renal cortical scarring is again noted. Kidneys enhance and excrete symmetrically. No direct renal injury or perinephric hemorrhage. No extravasation of contrast on excretory delayed phase imaging. Large right renal staghorn calculus with some mild urothelial thickening is overall similar to comparison study which appears at most only partially obstructive given lack of calyceal dilatation. No new urolithiasis or frank hydronephrosis is seen. Urinary bladder is unremarkable. Stomach/Bowel: Mild edematous thickening of the distal thoracic esophagus. Prominence of gastric rugal likely related to underdistention. Duodenum is unremarkable with normal sweep across the midline abdomen. No small bowel thickening or  dilatation. Cecum displaced to the midline abdomen. 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 No resulting mechanical obstruction or evidence of vascular compromise of the bowel loop. Only minimal hazy stranding of the herniated fat contents, certainly decreased from comparison. No distal colonic thickening or dilatation. Scattered colonic diverticula without focal inflammation to suggest diverticulitis. Vascular/Lymphatic: Atherosclerotic calcifications within the abdominal aorta and branch vessels. No aneurysm or ectasia. No enlarged abdominopelvic lymph nodes. Reproductive: Normal appearance of the uterus and adnexal structures. Other: Nonspecific lobular low-attenuation collection in the inferior aspect of the right pericolic gutter measuring up to 10 Hounsfield units (2/65 and measuring 2.2 x 2.7 x 2.8 cm in size (2/65, 6/41) is essentially unchanged from comparison in 2018. No abdominopelvic free air or fluid. Mild body wall edema. 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 Tiny fat containing umbilical hernia. No other bowel containing hernias. 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 No acute traumatic osseous abnormality. Bones of the pelvis are intact and congruent. Some remote posterolateral lower thoracic left rib deformities are unchanged from prior. IMPRESSION: CT head: 1. No acute intracranial abnormality. No significant scalp swelling or calvarial fracture. 2. Chronic microvascular ischemic changes, parenchymal volume loss and intracranial atherosclerosis. CT cervical spine: 1. No acute fracture or traumatic listhesis of the cervical spine. 2. Multilevel degenerative changes of the cervical spine as described above. CT  abdomen and pelvis: 1. No acute traumatic injury of the abdomen or pelvis. 2. Large right renal staghorn calculus with some mild urothelial thickening is overall similar to comparison study which appears at most only partially obstructive given lack of calyceal dilatation. Could correlate with features of urinalysis to exclude superimposed urinary tract infection. 3. Geographic region of hyperenhancement in the spleen with some increasingly cystic appearance centrally, measuring up to 2.4 cm in size, previously 1.8 cm on comparison contrast enhanced CT from 2018. Findings are favored to represent a benign process such as a hemangioma or lymphangioma though could be further evaluated with 6 month follow-up  MRI given some slow interval change in imaging characteristics this recommendation follows ACR consensus guidelines: White Paper of the ACR Incidental Findings Committee II on Splenic and Nodal Findings. J Am Coll Radiol [phone]. 4. Nonspecific lobular low-attenuation collection in the inferior aspect of the right pericolic gutter measuring up to 2.7 cm in size, essentially unchanged from comparison in comparison in 2018. Appearance and stability favors a benign etiology such as a seroma or lymphocele. 5. 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 No resulting mechanical obstruction or evidence of vascular compromise of the bowel loop. Some mild stranding in the herniated fat contents could reflect residual fat strangulation though is decreased from comparison. 6. Colonic diverticulosis without evidence of diverticulitis. 7. Mild body wall edema. 8. Aortic Atherosclerosis (ICD10-I70.0). Electronically Signed   By: Lovena Le M.D.   On: 07/22/2020 20:01   DG Knee Complete 4 Views Right  Result Date: 07/22/2020 CLINICAL DATA:  Pain status post fall EXAM: RIGHT KNEE - COMPLETE 4+ VIEW COMPARISON:  April 03, 2012 FINDINGS:  There is an acute periprosthetic fracture involving the supracondylar distal right femur most notably on the lateral aspect of the femur. There is a large joint effusion. There is an apparent new osteophyte arising from the upper pole of the patella. There is a subtle lucency coursing through this osteophyte suggestive of a nondisplaced fracture. There is soft tissue swelling about the knee. Vascular calcifications are noted. IMPRESSION: 1. Acute periprosthetic fracture involving the supracondylar distal right femur. 2. Lucency coursing through the osteophyte arising from the upper pole of the patella suggestive of a nondisplaced fracture. 3. Large joint effusion. 4. Soft tissue swelling about the knee. 5. Vascular calcifications. Electronically Signed   By: Constance Holster M.D.   On: 07/22/2020 18:42    ____________________________________________   INITIAL IMPRESSION / ASSESSMENT AND PLAN / ED COURSE  As part of my medical decision making, I reviewed the following data within the [place] notes reviewed and incorporated, Labs reviewed , Radiograph reviewed , Discussed with admitting physician and A consult was requested and obtained from this/these consultant(s) Orthopedics        Patient is a 79 year old female who presents to the emergency department for evaluation of multiple recent falls, 2 of which have been today.  She has difficulty explaining exactly how many falls she has had and in what timeframe.  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  Her biggest complaint is of right knee pain.  Laboratory evaluation in the emergency department includes respiratory panel, CMP, CBC, urinalysis.  CBC and CMP are grossly within normal limits.  Urinalysis is still pending at this time.  Imaging evaluation includes a CT of the head and neck, CT of the abdomen and pelvis with contrast as well as x-rays of left wrist and right  knee.  These are negative for any acute bony injury except for the right knee, which reveals a periprosthetic fracture that is not grossly displaced.  A consult was obtained from Dr. Geanie Cooley who is on-call for orthopedics who recommended that she will need a straight leg immobilizer as well to be completely nonweightbearing on this knee.  He recommended that if this could not be attained on her own at home, we would need to admit her for nursing facility placement.  After discussing with the patient, she lives with her sister who is a  disabled herself and she will not have help at home and does not feel that she would be able to remain nonweightbearing.  A consult was placed to the hospitalist for admission for nursing facility.  The patient is amenable with this plan at this time and care will be transferred to the attending Dr. Jonelle Sidle.      ____________________________________________   FINAL CLINICAL IMPRESSION(S) / ED DIAGNOSES  Final diagnoses:  Periprosthetic fracture around internal prosthetic right knee joint, initial encounter  Tear of skin of left wrist, initial encounter     ED Discharge Orders    None      *Please note:  Regina Mathis was evaluated in Emergency Department on 07/23/2020 for the symptoms described in the history of present illness. She was evaluated in the context of the global COVID-19 pandemic, which necessitated consideration that the patient might be at risk for infection with the SARS-CoV-2 virus that causes COVID-19. Institutional protocols and algorithms that pertain to the evaluation of patients at risk for COVID-19 are in a state of rapid change based on information released by regulatory bodies including the CDC and federal and state organizations. These policies and algorithms were followed during the patient's care in the ED.  Some ED evaluations and interventions may be delayed as a result of limited staffing during and the pandemic.*   Note:  This  document was prepared using Dragon voice recognition software and may include unintentional dictation errors.    Marlana Salvage, PA 07/23/20 [id number]    Merlyn Lot, MD 07/23/20 [phone]

## 2020-07-22 NOTE — Consult Note (Signed)
Called by ER PA regarding this 79 year old female with multiple recent falls, including 2 today with a right periprosthetic distal femur fracture. I have reviewed the right knee radiographs which demonstrate a comminuted periprosthetic fracture of the distal femur with minimal displacement and no obvious loosening to the femoral component.  There is also a possible non-displaced fracture of the patella seen on the lateral view.  Patient is on eliquis for valvular heart disease.    I am recommending non-operative treatment at this time given the minimal displacement of the fractures.   I have instructed the PA to place the right knee in a knee immobilizer.   The patient should avoid weightbearing or knee flexion.  She will need a walker or wheelchair to assist with her non-weightbearing restriction.  Patient should follow up within 1 week for re-evaluation and xray.  If the patient has difficulty with NWB restriction, she may need to be admitted to the hospitalist service for placement into a SNF.

## 2020-07-22 NOTE — ED Triage Notes (Signed)
Pt to ED via [facility] from home. Per EMS pt had unwitnessed mechanical fall. Pt fell twice today and has been falling frequently. Pt c/o right knee pain, pain 9/10. Pt with hx afib and HTN. Pt currently taking eliquis.

## 2020-07-23 ENCOUNTER — Inpatient Hospital Stay
Admit: 2020-07-23 | Discharge: 2020-07-23 | Disposition: A | Discharge status: Home / Self Care | Payer: Medicare HMO | Attending: Internal Medicine | Admitting: Internal Medicine

## 2020-07-23 LAB — COMPREHENSIVE METABOLIC PANEL
ALT: 53 U/L — ABNORMAL HIGH (ref 0–44)
AST: 140 U/L — ABNORMAL HIGH (ref 15–41)
Albumin: 3.7 g/dL (ref 3.5–5.0)
Alkaline Phosphatase: 68 U/L (ref 38–126)
Anion gap: 9 (ref 5–15)
BUN: 25 mg/dL — ABNORMAL HIGH (ref 8–23)
CO2: 29 mmol/L (ref 22–32)
Calcium: 8.6 mg/dL — ABNORMAL LOW (ref 8.9–10.3)
Chloride: 102 mmol/L (ref 98–111)
Creatinine, Ser: 0.72 mg/dL (ref 0.44–1.00)
GFR, Estimated: >60 mL/min (ref >60)
Glucose, Bld: 119 mg/dL — ABNORMAL HIGH (ref 70–99)
Potassium: 3 mmol/L — ABNORMAL LOW (ref 3.5–5.1)
Sodium: 140 mmol/L (ref 135–145)
Total Bilirubin: 1 mg/dL (ref 0.3–1.2)
Total Protein: 6.3 g/dL — ABNORMAL LOW (ref 6.5–8.1)

## 2020-07-23 LAB — CBC
HCT: 33.1 % — ABNORMAL LOW (ref 36.0–46.0)
Hemoglobin: 11 g/dL — ABNORMAL LOW (ref 12.0–15.0)
MCH: 29.9 pg (ref 26.0–34.0)
MCHC: 33.2 g/dL (ref 30.0–36.0)
MCV: 89.9 fL (ref 80.0–100.0)
Platelets: 176 10*3/uL (ref 150–400)
RBC: 3.68 MIL/uL — ABNORMAL LOW (ref 3.87–5.11)
RDW: 14.4 % (ref 11.5–15.5)
WBC: 4.4 10*3/uL (ref 4.0–10.5)
nRBC: 0 % (ref 0.0–0.2)

## 2020-07-23 MED ORDER — LIDOCAINE 5 % EX PTCH
1.0000 | MEDICATED_PATCH | CUTANEOUS | Status: DC
  Administered 2020-07-24 – 2020-07-25 (×2): 1 via TRANSDERMAL
  Filled 2020-07-23 (×2): qty 1

## 2020-07-23 MED ORDER — POTASSIUM CHLORIDE CRYS ER 20 MEQ PO TBCR
40.0000 meq | EXTENDED_RELEASE_TABLET | Freq: Two times a day (BID) | ORAL | Status: AC
  Administered 2020-07-23 – 2020-07-24 (×2): 40 meq via ORAL
  Filled 2020-07-23 (×2): qty 2

## 2020-07-23 MED ORDER — SODIUM CHLORIDE 0.9 % IV SOLN
1.0000 g | INTRAVENOUS | Status: DC
  Administered 2020-07-23: 1 g via INTRAVENOUS
  Filled 2020-07-23 (×3): qty 10

## 2020-07-23 NOTE — ED Notes (Signed)
Case Management at bedside at this time

## 2020-07-23 NOTE — Evaluation (Signed)
Occupational Therapy Evaluation Patient Details Name: Regina Mathis MRN: [id number] DOB: Apr 08, 1941 Today's Date: 07/23/2020    History of Present Illness 79 y.o. female with medical history significant of osteoporosis, osteoarthritis status post total knee replacement, hyperlipidemia, atrial fibrillation on chronic anticoagulation, hypertension, GERD, scoliosis, and valvular heart disease who sustained a fall at home.  Patient has had recurrent falls lately, this episode she fell and twisted her right knee.  She was brought in unable to walk and in severe pain.  Patient was found to have periprosthetic fracture of the right knee. Orthopedic recommends non-surgical conservative treatment.     Clinical Impression   Pt was seen for OT evaluation this date. Prior to hospital admission, pt was MOD I with fxl mobility with occasional use of cane/walker for fxl mobility and is INDEP with BADLs. Endorses having aide/cleaning help come 2x/wk for laundry, cleaning and some other light HH IADLs. Pt lives with sister who is disabled in [facility] with 4STE and a ramp. Currently pt demonstrates impairments as described below (See OT problem list) which functionally limit her ability to perform ADL/self-care tasks. Pt currently requires MAX A with bed mobility and upon attempt to come to standing from elevated EOB, required MAX to TOTAL A and unsuccessfully extends to full stand. In addition, she requires MAX to TOTAL A for LB ADLs at this time including dressing, bathing and toileting. Pt would benefit from skilled OT services to address noted impairments and functional limitations (see below for any additional details) in order to maximize safety and independence while minimizing falls risk and caregiver burden. Upon hospital discharge, recommend STR to maximize pt safety and return to PLOF.     Follow Up Recommendations  SNF    Equipment Recommendations  3 in 1 bedside commode;Tub/shower seat    Recommendations  for Other Services       Precautions / Restrictions Precautions Precautions: Fall Precaution Comments: no knee flexion Required Braces or Orthoses: Knee Immobilizer - Right Knee Immobilizer - Right: On at all times Restrictions Weight Bearing Restrictions: Yes RLE Weight Bearing: Non weight bearing      Mobility Bed Mobility Overal bed mobility: Needs Assistance Bed Mobility: Supine to Sit;Sit to Supine     Supine to sit: Max assist Sit to supine: Max assist   General bed mobility comments: pt made good effort. HOB elevated and use of bed rails encouraged. Pt requires use of draw sheet, MAX A and ultimately logroll technique to reduce pain for sup to sit.    Transfers                 General transfer comment: Pt with KI to R LE that appears to be at appropriate height. attempted to perfor sit to stand with bed height elevated ~6" and use of RW, pt able to minimally clear R hip from bed with weight primarily over L LE, but ultimately unable to come to full stand despite MAX A.    Balance Overall balance assessment: Needs assistance Sitting-balance support: Bilateral upper extremity supported Sitting balance-Leahy Scale: Fair Sitting balance - Comments: requires use of draw sheet to scoot to come to stable EOB sitting, but once hips square, demos F static sitting balance with UE support.       Standing balance comment: attempted, but unable to come to full stand with MAX A from elevated surface.  ADL either performed or assessed with clinical judgement   ADL Overall ADL's : Needs assistance/impaired     Grooming: Minimal assistance;Sitting Grooming Details (indicate cue type and reason): MIN A for sitting balance support at EOB, SETUP for actual grooming task         Upper Body Dressing : Minimal assistance;Sitting Upper Body Dressing Details (indicate cue type and reason): MIN A for sitting balance at EOB. SETUP for actual  dressing task Lower Body Dressing: Maximal assistance;Total assistance Lower Body Dressing Details (indicate cue type and reason): pt makes good effort/attempt to don sock to her uninjured L LE, but ultimately requires MAX A from OT. TOTAL A to dress R LE   Toilet Transfer Details (indicate cue type and reason): unable to successfully SPS this date with MAX A +1, anticipate 2p assist needed to SPS to/from [facility] at this time. Toileting- Clothing Manipulation and Hygiene: Maximal assistance;Bed level Toileting - Clothing Manipulation Details (indicate cue type and reason): use of purewick and based on clinical observation-MAX A to roll at bed level for posterior peri care. Limited by R LE pain             Vision Baseline Vision/History: Wears glasses Wears Glasses: At all times Patient Visual Report: No change from baseline       Perception     Praxis      Pertinent Vitals/Pain Pain Assessment: 0-10 Pain Score: 7  Pain Location: R knee, up to 8/9 with mobility attempts Pain Descriptors / Indicators: Grimacing;Guarding;Tender;Moaning Pain Intervention(s): Limited activity within patient's tolerance;Monitored during session;Premedicated before session     Hand Dominance Right   Extremity/Trunk Assessment Upper Extremity Assessment Upper Extremity Assessment: Overall WFL for tasks assessed;Generalized weakness (pt with some limited shoulder ROM, all other joints WFL. MMT shoulder grossly 3-/5; elbow, wrist and grip grossly 4-/5)   Lower Extremity Assessment Lower Extremity Assessment: Defer to PT evaluation;RLE deficits/detail RLE Deficits / Details: unable to lift (hip flex) against gravity, pain with ABd more than ADd RLE: Unable to fully assess due to pain;Unable to fully assess due to immobilization       Communication Communication Communication: No difficulties   Cognition Arousal/Alertness: Awake/alert Behavior During Therapy: WFL for tasks assessed/performed Overall  Cognitive Status: Within Functional Limits for tasks assessed                                 General Comments: pt is appropriate with all commands/questions, and mostly oriented except for exact date. 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 Pt with good understanding. Other Exercises: OT educates re: logroll tehcnique to reduce pain, potential need to modify lower body ADLs. Pt with moderate understanding, will require f/u and training/trials.   Shoulder Instructions      Home Living Family/patient expects to be discharged to:: Private residence Living Arrangements: Other relatives (lives with her sister. Apparently sister with h/o CVA and hemiplegia. Relies on pt for assistance) Available Help at Discharge: Other (Comment) (pt reports more of a caregiver role for her sister. States she has a brother a few hours away and a neice that is 1 hour away.) Type  of Home: House Home Access: Ramped entrance;Stairs to enter [employer] of Steps: 4 Entrance Stairs-Rails:  (yes) Home Layout: One level               Home Equipment: Walker - 2 wheels;Cane - single point          Prior Functioning/Environment Level of Independence: Needs assistance  Gait / Transfers Assistance Needed: Pt reports she is out minimally for errands, MD appts. 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   Comments: Pt resports she is out minimally for errands, MD appts        OT  Problem List: Decreased strength;Decreased range of motion;Decreased activity tolerance;Impaired balance (sitting and/or standing);Decreased knowledge of use of DME or AE;Decreased knowledge of precautions;Pain      OT Treatment/Interventions: Self-care/ADL training;DME and/or AE instruction;Therapeutic activities;Balance training;Therapeutic exercise;Patient/family education    OT Goals(Current goals can be found in the care plan section) Acute Rehab OT Goals Patient Stated Goal: get back home with sister OT Goal Formulation: With patient Time For Goal Achievement: 08/06/20 Potential to Achieve Goals: Good ADL Goals Pt Will Perform Lower Body Dressing: with min assist;sit to/from stand (with AE) Pt Will Transfer to Toilet: with min assist;stand pivot transfer;bedside commode Pt Will Perform Toileting - Clothing Manipulation and hygiene: with min assist;sit to/from stand Pt/caregiver will Perform Home Exercise Program: Increased strength;Both right and left upper extremity;With Supervision  OT Frequency: Min 1X/week   Barriers to D/C:            Co-evaluation              AM-PAC OT "6 Clicks" Daily Activity     Outcome Measure Help from another person eating meals?: None Help from another person taking care of personal grooming?: A Little Help from another person toileting, which includes using toliet, bedpan, or urinal?: A Lot Help from another person bathing (including washing, rinsing, drying)?: A Lot Help from another person to put on and taking off regular upper body clothing?: A Little Help from another person to put on and taking off regular lower body clothing?: Total 6 Click Score: 15   End of Session Equipment Utilized During Treatment: Gait belt;Rolling walker Nurse Communication: Mobility status;Other (comment) (notified of potential need for 2p assist for ADL transfers such as to/from [facility])  Activity Tolerance: Patient tolerated treatment well;Patient limited by  pain Patient left: in bed;with call bell/phone within reach;with bed alarm set  OT Visit Diagnosis: Unsteadiness on feet (R26.81);Other abnormalities of gait and mobility (R26.89);Muscle weakness (generalized) (M62.81);History of falling (Z91.81)                Time: 0254-2706 OT Time Calculation (min): 42 min Charges:  OT General Charges $OT Visit: 1 Visit OT Evaluation $OT Eval Moderate Complexity: 1 Mod OT Treatments $Self Care/Home Management : 8-22 mins $Therapeutic Activity: 8-22 mins  Gerrianne Scale, MS, OTR/L ascom [phone] 07/23/20, 4:07 PM

## 2020-07-23 NOTE — ED Notes (Signed)
This RN transporting pt to 1A at this time

## 2020-07-23 NOTE — Consult Note (Addendum)
ORTHOPAEDIC CONSULTATION  REQUESTING PHYSICIAN: Nolberto Hanlon, MD  Chief Complaint: Right knee pain status post fall  HPI: Regina Mathis is a 79 y.o. female who lives with her sister, who also has significant physical limitations, and explains that she fell yesterday going from her car to her house.  She fell onto concrete onto her right knee.  Patient had a right total knee arthroplasty performed by Dr. Marry Guan on 04/03/12.  Patient planes of right knee pain but denies other injuries from her fall.  Orthopedics is consulted for management of her right periprosthetic fracture.  X-rays in the emergency department revealed a minimally displaced distal femoral periprosthetic fracture and a nondisplaced right patella fracture.  Past Medical History:  Diagnosis Date  . GERD (gastroesophageal reflux disease)   . Hyperlipidemia   . Hypertension   . Osteoarthritis   . Pre-diabetes   . Scoliosis deformity of spine   . Valvular heart disease    Echocardiogram 2014- moderate mitral regurg, moderate tricuspid regurg, moderate dilated left atrium and right atrium, EF 50-55%   Past Surgical History:  Procedure Laterality Date  . CATARACT EXTRACTION W/ INTRAOCULAR LENS  IMPLANT, BILATERAL    . CHOLECYSTECTOMY    . COLONOSCOPY WITH PROPOFOL N/A 01/16/2016   Procedure: COLONOSCOPY WITH PROPOFOL;  Surgeon: [name], MD;  Location: [facility] ENDOSCOPY;  Service: Endoscopy;  Laterality: N/A;  . HERNIA REPAIR     with Bowel Involvement (04/2012)  . HYSTEROSCOPY WITH D & C N/A 01/14/2017   Procedure: DILATATION AND CURETTAGE /HYSTEROSCOPY;  Surgeon: Honor Loh Ward, MD;  Location: [facility] ORS;  Service: Gynecology;  Laterality: N/A;  . 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 Lumpectomy Chest Area    . TOTAL KNEE ARTHROPLASTY Bilateral    Social History   Socioeconomic History  . Marital status: Widowed    Spouse name: Not on file  . Number of children: Not on  file  . Years of education: Not on file  . Highest education level: Not on file  Occupational History  . Occupation: retired  Tobacco Use  . Smoking status: Never Smoker  . Smokeless tobacco: Never Used  Vaping Use  . Vaping Use: Never used  Substance and Sexual Activity  . Alcohol use: No    Alcohol/week: 0.0 standard drinks  . Drug use: No  . Sexual activity: Not Currently  Other Topics Concern  . Not on file  Social History Narrative  . Not on file   Social Determinants of Health   Financial Resource Strain:   . Difficulty of Paying Living Expenses: Not on file  Food Insecurity:   . Worried About Charity fundraiser in the Last Year: Not on file  . Ran Out of Food in the Last Year: Not on file  Transportation Needs:   . Lack of Transportation (Medical): Not on file  . Lack of Transportation (Non-Medical): Not on file  Physical Activity:   . Days of Exercise per Week: Not on file  . Minutes of Exercise per Session: Not on file  Stress:   . Feeling of Stress : Not on file  Social Connections:   . Frequency of Communication with Friends and Family: Not on file  . Frequency of Social Gatherings with Friends and Family: Not on file  . Attends Religious Services: Not on file  . Active Member of Clubs or Organizations: Not on file  . Attends Archivist Meetings: Not  on file  . Marital Status: Not on file   Family History  Problem Relation Age of Onset  . CAD Mother   . Diabetes Mellitus II Mother   . Stroke Mother   . Lung cancer Father   . Diabetes Mellitus II Sister   . Diabetes Mellitus II Brother   . Breast cancer Neg Hx   . Bladder Cancer Neg Hx   . Kidney cancer Neg Hx    Allergies  Allergen Reactions  . Other    Prior to Admission medications   Medication Sig Start Date End Date Taking? Authorizing Provider  apixaban (ELIQUIS) 5 MG TABS tablet Take by mouth. 05/08/20   [provider]  Ascorbic Acid (VITAMIN C) 1000 MG tablet Take  1,000 mg by mouth daily.     [provider]  Cholecalciferol (VITAMIN D3) 2000 units TABS Take 2,000 Units by mouth daily. 03/31/17   Coral Spikes, DO  ciprofloxacin (CIPRO) 500 MG tablet ciprofloxacin 500 mg tablet    [provider]  citalopram (CELEXA) 10 MG tablet  04/19/19   [provider]  donepezil (ARICEPT) 10 MG tablet Take by mouth. 04/10/19   [provider]  furosemide (LASIX) 20 MG tablet furosemide 20 mg tablet    [provider]  HYDROcodone-acetaminophen (NORCO) 5-325 MG tablet Norco 5 mg-325 mg tablet  Take 1- 2  tablet(s) EVERY 4 - 6 HOURS by oral route. PRN PAIN    [provider]  lisinopril-hydrochlorothiazide (PRINZIDE,ZESTORETIC) 20-25 MG tablet lisinopril 20 mg-hydrochlorothiazide 25 mg tablet    [provider]  memantine (NAMENDA) 5 MG tablet  06/12/17   [provider]  mirabegron ER (MYRBETRIQ) 25 MG TB24 tablet Take 1 tablet (25 mg total) by mouth daily. 09/06/18   Zara Council A, PA-C  omeprazole (PRILOSEC) 20 MG capsule  01/12/19   [provider]  potassium chloride (K-DUR) 10 MEQ tablet Take 10 mEq by mouth daily. 03/12/19   [provider]  Probiotic Product (PROBIOTIC-10) CHEW Chew 1 tablet by mouth 2 (two) times daily. 03/31/17   Coral Spikes, DO  ranitidine (ZANTAC) 150 MG tablet Take 150 mg by mouth at bedtime.  03/11/17   [provider]  simvastatin (ZOCOR) 40 MG tablet  12/22/18   [provider]  tiZANidine (ZANAFLEX) 2 MG tablet  01/29/19   [provider]   DG Wrist Complete Left  Result Date: 07/22/2020 CLINICAL DATA:  Pain EXAM: LEFT WRIST - COMPLETE 3+ VIEW COMPARISON:  None. FINDINGS: There is no evidence of fracture or dislocation. There is no evidence of arthropathy or other focal bone abnormality. Soft tissues are unremarkable. IMPRESSION: Negative. Electronically Signed   By: Constance Holster M.D.   On: 07/22/2020 18:43   CT Head  Wo Contrast  Result Date: 07/22/2020 CLINICAL DATA:  Fall with positive head strike on concrete, diffuse pain, on anticoagulation EXAM: CT HEAD WITHOUT CONTRAST CT CERVICAL SPINE WITHOUT CONTRAST CT ABDOMEN AND PELVIS WITH CONTRAST TECHNIQUE: Contiguous axial images were obtained from the base of the skull through the vertex without intravenous contrast. Multidetector CT imaging of the cervical spine was performed without intravenous contrast. Multiplanar CT image reconstructions were also generated. Multidetector CT imaging of the abdomen and pelvis was performed using the standard protocol following bolus administration of intravenous contrast. CONTRAST:  13mL OMNIPAQUE IOHEXOL 300 MG/ML  SOLN COMPARISON:  MR brain 01/31/2018, CT abdomen pelvis 09/01/2018, cervical spine radiograph 05/09/2013 FINDINGS: CT HEAD FINDINGS Brain: No evidence  of acute infarction, hemorrhage, hydrocephalus, extra-axial collection, visible mass lesion or mass effect. Symmetric prominence of the ventricles, cisterns and sulci compatible with parenchymal volume loss. Patchy areas of white matter hypoattenuation are most compatible with chronic microvascular angiopathy. Vascular: Atherosclerotic calcification of the carotid siphons and intradural vertebral arteries. No hyperdense vessel. Skull: No significant scalp swelling. No calvarial fracture or acute osseous injury. Sinuses/Orbits: Minimal thickening in the ethmoids. Paranasal sinuses and mastoid air cells are otherwise predominantly clear. Orbits are unremarkable. Other: None. CT CERVICAL SPINE FINDINGS Alignment: Cervical stabilization collar absent the time of examination. Mild focal reversal the normal cervical lordosis centered at C5-6 as well as some mild anterolisthesis C3 on 4 of approximately 3 mm,, favored to be on a degenerative basis with cervical spondylitic changes at this level. No evidence of traumatic listhesis. No abnormally widened, perched or jumped facets.  Normal alignment of the craniocervical and atlantoaxial articulations accounting for rightward cranial rotation. Skull base and vertebrae: No acute skull base fracture. No vertebral body fracture or height loss. The osseous structures appear diffusely demineralized which may limit detection of small or nondisplaced fractures. Photon starvation artifact seen below C5 may further limit detection of subtle anomaly. Atlantodental arthrosis with calcific pannus formation, nonspecific though can be seen with CPPD or rheumatoid arthropathy. Multilevel spondylitic changes, as detailed below. Soft tissues and spinal canal: No pre or paravertebral fluid or swelling. No visible canal hematoma. Disc levels: Multilevel intervertebral disc height loss with spondylitic endplate changes. Larger disc osteophyte complexes present C4-5, C5-6 with some ligamentum flavum infolding result in at most mild canal stenosis best seen on the orthogonal reconstructions. Additional uncinate spurring and facet hypertrophic changes present throughout the cervical spine resulting in mild-to-moderate neural foraminal narrowing diffusely more moderate to severe C4-C6. Upper chest: No acute abnormality in the upper chest or imaged lung apices. Other: Cervical carotid atherosclerosis. No worrisome thyroid nodules. CT ABDOMEN AND PELVIS FINDINGS Lower chest: Atelectatic changes present in the lung bases. Some additional scarring and/or subsegmental atelectasis seen in the right middle lobe and lingula. 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 Cardiac size is top normal. Three-vessel coronary artery atherosclerosis. No pericardial effusion. Hepatobiliary: No direct hepatic injury or perihepatic hematoma. No worrisome focal liver lesions. Smooth liver surface contour. Normal hepatic attenuation. Patient is post cholecystectomy. Prominence of the extrahepatic  biliary tree with the common bile duct measuring up to 16 mm in diameter which is unchanged from prior and likely related to a combination of senescent change and post cholecystectomy reservoir effect. Pancreas: No pancreatic contusive change or ductal disruption. Mild pancreatic atrophy. No pancreatic ductal dilatation or surrounding inflammatory changes. Spleen: No acute splenic injury or perisplenic hemorrhage. Geographic region of hyperenhancement seen in the spleen with some increasingly cystic appearance centrally, measuring up to 2.4 cm, previously 1.8 cm on comparison contrast enhanced CT from 2018. No other focal splenic lesion. Normal splenic size. Adrenals/Urinary Tract: Nodular thickening of the adrenal glands is similar to comparisons. Bilateral renal cortical scarring is again noted. Kidneys enhance and excrete symmetrically. No direct renal injury or perinephric hemorrhage. No extravasation of contrast on excretory delayed phase imaging. Large right renal staghorn calculus with some mild urothelial thickening is overall similar to comparison study which appears at most only partially obstructive given lack of calyceal dilatation. No new urolithiasis or frank hydronephrosis is seen. Urinary bladder is unremarkable. Stomach/Bowel: Mild edematous thickening of the distal thoracic esophagus. Prominence  of gastric rugal likely related to underdistention. Duodenum is unremarkable with normal sweep across the midline abdomen. No small bowel thickening or dilatation. Cecum displaced to the midline abdomen. 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 No resulting mechanical obstruction or evidence of vascular compromise of the bowel loop. Only minimal hazy stranding of the herniated fat contents, certainly decreased from comparison. No distal colonic thickening or dilatation. Scattered colonic diverticula without  focal inflammation to suggest diverticulitis. Vascular/Lymphatic: Atherosclerotic calcifications within the abdominal aorta and branch vessels. No aneurysm or ectasia. No enlarged abdominopelvic lymph nodes. Reproductive: Normal appearance of the uterus and adnexal structures. Other: Nonspecific lobular low-attenuation collection in the inferior aspect of the right pericolic gutter measuring up to 10 Hounsfield units (2/65 and measuring 2.2 x 2.7 x 2.8 cm in size (2/65, 6/41) is essentially unchanged from comparison in 2018. No abdominopelvic free air or fluid. Mild body wall edema. 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 Tiny fat containing umbilical hernia. No other bowel containing hernias. 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 No acute traumatic osseous abnormality. Bones of the pelvis are intact and congruent. Some remote posterolateral lower thoracic left rib deformities are unchanged from prior. IMPRESSION: CT head: 1. No acute intracranial abnormality. No significant scalp swelling or calvarial fracture. 2. Chronic microvascular ischemic changes, parenchymal volume loss and intracranial atherosclerosis. CT cervical spine: 1. No acute fracture or traumatic listhesis of the cervical spine. 2. Multilevel degenerative changes of the cervical spine as described above. CT abdomen and pelvis: 1. No acute traumatic injury of the abdomen or pelvis. 2. Large right renal staghorn calculus with some mild urothelial thickening is overall similar to comparison study which appears at most only partially obstructive given lack of calyceal dilatation. Could correlate with features of urinalysis to exclude superimposed urinary tract infection. 3. Geographic region of hyperenhancement in the spleen with some increasingly cystic  appearance centrally, measuring up to 2.4 cm in size, previously 1.8 cm on comparison contrast enhanced CT from 2018. Findings are favored to represent a benign process such as a hemangioma or lymphangioma though could be further evaluated with 6 month follow-up MRI given some slow interval change in imaging characteristics this recommendation follows ACR consensus guidelines: White Paper of the ACR Incidental Findings Committee II on Splenic and Nodal Findings. J Am Coll Radiol [phone]. 4. Nonspecific lobular low-attenuation collection in the inferior aspect of the right pericolic gutter measuring up to 2.7 cm in size, essentially unchanged from comparison in comparison in 2018. Appearance and stability favors a benign etiology such as a seroma or lymphocele. 5. 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 No resulting mechanical obstruction or evidence of vascular compromise of the bowel loop. Some mild stranding in the herniated fat contents could reflect residual fat strangulation though is decreased from comparison. 6. Colonic diverticulosis without evidence of diverticulitis. 7. Mild body wall edema. 8. Aortic Atherosclerosis (ICD10-I70.0). Electronically Signed   By: Lovena Le M.D.   On: 07/22/2020 20:01   CT Cervical Spine Wo Contrast  Result Date: 07/22/2020 CLINICAL DATA:  Fall with positive head strike on concrete, diffuse pain, on anticoagulation EXAM: CT HEAD WITHOUT CONTRAST CT CERVICAL SPINE WITHOUT CONTRAST CT ABDOMEN AND PELVIS  WITH CONTRAST TECHNIQUE: Contiguous axial images were obtained from the base of the skull through the vertex without intravenous contrast. Multidetector CT imaging of the cervical spine was performed without intravenous contrast. Multiplanar CT image reconstructions were also generated. Multidetector CT imaging of the abdomen and pelvis was performed using the standard protocol  following bolus administration of intravenous contrast. CONTRAST:  135mL OMNIPAQUE IOHEXOL 300 MG/ML  SOLN COMPARISON:  MR brain 01/31/2018, CT abdomen pelvis 09/01/2018, cervical spine radiograph 05/09/2013 FINDINGS: CT HEAD FINDINGS Brain: No evidence of acute infarction, hemorrhage, hydrocephalus, extra-axial collection, visible mass lesion or mass effect. Symmetric prominence of the ventricles, cisterns and sulci compatible with parenchymal volume loss. Patchy areas of white matter hypoattenuation are most compatible with chronic microvascular angiopathy. Vascular: Atherosclerotic calcification of the carotid siphons and intradural vertebral arteries. No hyperdense vessel. Skull: No significant scalp swelling. No calvarial fracture or acute osseous injury. Sinuses/Orbits: Minimal thickening in the ethmoids. Paranasal sinuses and mastoid air cells are otherwise predominantly clear. Orbits are unremarkable. Other: None. CT CERVICAL SPINE FINDINGS Alignment: Cervical stabilization collar absent the time of examination. Mild focal reversal the normal cervical lordosis centered at C5-6 as well as some mild anterolisthesis C3 on 4 of approximately 3 mm,, favored to be on a degenerative basis with cervical spondylitic changes at this level. No evidence of traumatic listhesis. No abnormally widened, perched or jumped facets. Normal alignment of the craniocervical and atlantoaxial articulations accounting for rightward cranial rotation. Skull base and vertebrae: No acute skull base fracture. No vertebral body fracture or height loss. The osseous structures appear diffusely demineralized which may limit detection of small or nondisplaced fractures. Photon starvation artifact seen below C5 may further limit detection of subtle anomaly. Atlantodental arthrosis with calcific pannus formation, nonspecific though can be seen with CPPD or rheumatoid arthropathy. Multilevel spondylitic changes, as detailed below. Soft tissues  and spinal canal: No pre or paravertebral fluid or swelling. No visible canal hematoma. Disc levels: Multilevel intervertebral disc height loss with spondylitic endplate changes. Larger disc osteophyte complexes present C4-5, C5-6 with some ligamentum flavum infolding result in at most mild canal stenosis best seen on the orthogonal reconstructions. Additional uncinate spurring and facet hypertrophic changes present throughout the cervical spine resulting in mild-to-moderate neural foraminal narrowing diffusely more moderate to severe C4-C6. Upper chest: No acute abnormality in the upper chest or imaged lung apices. Other: Cervical carotid atherosclerosis. No worrisome thyroid nodules. CT ABDOMEN AND PELVIS FINDINGS Lower chest: Atelectatic changes present in the lung bases. Some additional scarring and/or subsegmental atelectasis seen in the right middle lobe and lingula. 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 Cardiac size is top normal. Three-vessel coronary artery atherosclerosis. No pericardial effusion. Hepatobiliary: No direct hepatic injury or perihepatic hematoma. No worrisome focal liver lesions. Smooth liver surface contour. Normal hepatic attenuation. Patient is post cholecystectomy. Prominence of the extrahepatic biliary tree with the common bile duct measuring up to 16 mm in diameter which is unchanged from prior and likely related to a combination of senescent change and post cholecystectomy reservoir effect. Pancreas: No pancreatic contusive change or ductal disruption. Mild pancreatic atrophy. No pancreatic ductal dilatation or surrounding inflammatory changes. Spleen: No acute splenic injury or perisplenic hemorrhage. Geographic region of hyperenhancement seen in the spleen with some increasingly cystic appearance centrally, measuring up to 2.4 cm, previously 1.8 cm on comparison contrast enhanced CT from  2018. No other focal splenic lesion. Normal splenic size. Adrenals/Urinary  Tract: Nodular thickening of the adrenal glands is similar to comparisons. Bilateral renal cortical scarring is again noted. Kidneys enhance and excrete symmetrically. No direct renal injury or perinephric hemorrhage. No extravasation of contrast on excretory delayed phase imaging. Large right renal staghorn calculus with some mild urothelial thickening is overall similar to comparison study which appears at most only partially obstructive given lack of calyceal dilatation. No new urolithiasis or frank hydronephrosis is seen. Urinary bladder is unremarkable. Stomach/Bowel: Mild edematous thickening of the distal thoracic esophagus. Prominence of gastric rugal likely related to underdistention. Duodenum is unremarkable with normal sweep across the midline abdomen. No small bowel thickening or dilatation. Cecum displaced to the midline abdomen. 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 No resulting mechanical obstruction or evidence of vascular compromise of the bowel loop. Only minimal hazy stranding of the herniated fat contents, certainly decreased from comparison. No distal colonic thickening or dilatation. Scattered colonic diverticula without focal inflammation to suggest diverticulitis. Vascular/Lymphatic: Atherosclerotic calcifications within the abdominal aorta and branch vessels. No aneurysm or ectasia. No enlarged abdominopelvic lymph nodes. Reproductive: Normal appearance of the uterus and adnexal structures. Other: Nonspecific lobular low-attenuation collection in the inferior aspect of the right pericolic gutter measuring up to 10 Hounsfield units (2/65 and measuring 2.2 x 2.7 x 2.8 cm in size (2/65, 6/41) is essentially unchanged from comparison in 2018. No abdominopelvic free air or fluid. Mild body wall edema. Fat and colon  containing Morgagni hernia as above. Tiny fat containing umbilical hernia. No other bowel containing hernias. 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 No acute traumatic osseous abnormality. Bones of the pelvis are intact and congruent. Some remote posterolateral lower thoracic left rib deformities are unchanged from prior. IMPRESSION: CT head: 1. No acute intracranial abnormality. No significant scalp swelling or calvarial fracture. 2. Chronic microvascular ischemic changes, parenchymal volume loss and intracranial atherosclerosis. CT cervical spine: 1. No acute fracture or traumatic listhesis of the cervical spine. 2. Multilevel degenerative changes of the cervical spine as described above. CT abdomen and pelvis: 1. No acute traumatic injury of the abdomen or pelvis. 2. Large right renal staghorn calculus with some mild urothelial thickening is overall similar to comparison study which appears at most only partially obstructive given lack of calyceal dilatation. Could correlate with features of urinalysis to exclude superimposed urinary tract infection. 3. Geographic region of hyperenhancement in the spleen with some increasingly cystic appearance centrally, measuring up to 2.4 cm in size, previously 1.8 cm on comparison contrast enhanced CT from 2018. Findings are favored to represent a benign process such as a hemangioma or lymphangioma though could be further evaluated with 6 month follow-up MRI given some slow interval change in imaging characteristics this recommendation follows ACR consensus guidelines: White Paper of the ACR Incidental Findings Committee II on Splenic and Nodal Findings. J Am Coll Radiol [phone]. 4. Nonspecific lobular low-attenuation collection in the inferior aspect of the right pericolic gutter measuring  up to 2.7 cm in size, essentially unchanged from comparison in comparison in 2018. Appearance and stability favors a benign etiology such as a seroma or lymphocele. 5. 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 No resulting mechanical obstruction or evidence of vascular compromise of the  bowel loop. Some mild stranding in the herniated fat contents could reflect residual fat strangulation though is decreased from comparison. 6. Colonic diverticulosis without evidence of diverticulitis. 7. Mild body wall edema. 8. Aortic Atherosclerosis (ICD10-I70.0). Electronically Signed   By: Lovena Le M.D.   On: 07/22/2020 20:01   CT ABDOMEN PELVIS W CONTRAST  Result Date: 07/22/2020 CLINICAL DATA:  Fall with positive head strike on concrete, diffuse pain, on anticoagulation EXAM: CT HEAD WITHOUT CONTRAST CT CERVICAL SPINE WITHOUT CONTRAST CT ABDOMEN AND PELVIS WITH CONTRAST TECHNIQUE: Contiguous axial images were obtained from the base of the skull through the vertex without intravenous contrast. Multidetector CT imaging of the cervical spine was performed without intravenous contrast. Multiplanar CT image reconstructions were also generated. Multidetector CT imaging of the abdomen and pelvis was performed using the standard protocol following bolus administration of intravenous contrast. CONTRAST:  122mL OMNIPAQUE IOHEXOL 300 MG/ML  SOLN COMPARISON:  MR brain 01/31/2018, CT abdomen pelvis 09/01/2018, cervical spine radiograph 05/09/2013 FINDINGS: CT HEAD FINDINGS Brain: No evidence of acute infarction, hemorrhage, hydrocephalus, extra-axial collection, visible mass lesion or mass effect. Symmetric prominence of the ventricles, cisterns and sulci compatible with parenchymal volume loss. Patchy areas of white matter hypoattenuation are most compatible with chronic microvascular angiopathy. Vascular: Atherosclerotic calcification of the carotid  siphons and intradural vertebral arteries. No hyperdense vessel. Skull: No significant scalp swelling. No calvarial fracture or acute osseous injury. Sinuses/Orbits: Minimal thickening in the ethmoids. Paranasal sinuses and mastoid air cells are otherwise predominantly clear. Orbits are unremarkable. Other: None. CT CERVICAL SPINE FINDINGS Alignment: Cervical stabilization collar absent the time of examination. Mild focal reversal the normal cervical lordosis centered at C5-6 as well as some mild anterolisthesis C3 on 4 of approximately 3 mm,, favored to be on a degenerative basis with cervical spondylitic changes at this level. No evidence of traumatic listhesis. No abnormally widened, perched or jumped facets. Normal alignment of the craniocervical and atlantoaxial articulations accounting for rightward cranial rotation. Skull base and vertebrae: No acute skull base fracture. No vertebral body fracture or height loss. The osseous structures appear diffusely demineralized which may limit detection of small or nondisplaced fractures. Photon starvation artifact seen below C5 may further limit detection of subtle anomaly. Atlantodental arthrosis with calcific pannus formation, nonspecific though can be seen with CPPD or rheumatoid arthropathy. Multilevel spondylitic changes, as detailed below. Soft tissues and spinal canal: No pre or paravertebral fluid or swelling. No visible canal hematoma. Disc levels: Multilevel intervertebral disc height loss with spondylitic endplate changes. Larger disc osteophyte complexes present C4-5, C5-6 with some ligamentum flavum infolding result in at most mild canal stenosis best seen on the orthogonal reconstructions. Additional uncinate spurring and facet hypertrophic changes present throughout the cervical spine resulting in mild-to-moderate neural foraminal narrowing diffusely more moderate to severe C4-C6. Upper chest: No acute abnormality in the upper chest or imaged lung apices.  Other: Cervical carotid atherosclerosis. No worrisome thyroid nodules. CT ABDOMEN AND PELVIS FINDINGS Lower chest: Atelectatic changes present in the lung bases. Some additional scarring and/or subsegmental atelectasis seen in the right middle lobe and lingula. 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 Cardiac size is top normal. Three-vessel coronary artery atherosclerosis. No pericardial effusion. Hepatobiliary: No direct hepatic injury or perihepatic hematoma. No worrisome focal liver lesions. Smooth liver surface contour. Normal hepatic attenuation. Patient is post cholecystectomy. Prominence of the extrahepatic biliary tree with the common bile duct measuring up to  16 mm in diameter which is unchanged from prior and likely related to a combination of senescent change and post cholecystectomy reservoir effect. Pancreas: No pancreatic contusive change or ductal disruption. Mild pancreatic atrophy. No pancreatic ductal dilatation or surrounding inflammatory changes. Spleen: No acute splenic injury or perisplenic hemorrhage. Geographic region of hyperenhancement seen in the spleen with some increasingly cystic appearance centrally, measuring up to 2.4 cm, previously 1.8 cm on comparison contrast enhanced CT from 2018. No other focal splenic lesion. Normal splenic size. Adrenals/Urinary Tract: Nodular thickening of the adrenal glands is similar to comparisons. Bilateral renal cortical scarring is again noted. Kidneys enhance and excrete symmetrically. No direct renal injury or perinephric hemorrhage. No extravasation of contrast on excretory delayed phase imaging. Large right renal staghorn calculus with some mild urothelial thickening is overall similar to comparison study which appears at most only partially obstructive given lack of calyceal dilatation. No new urolithiasis or frank hydronephrosis is seen. Urinary bladder is  unremarkable. Stomach/Bowel: Mild edematous thickening of the distal thoracic esophagus. Prominence of gastric rugal likely related to underdistention. Duodenum is unremarkable with normal sweep across the midline abdomen. No small bowel thickening or dilatation. Cecum displaced to the midline abdomen. 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 No resulting mechanical obstruction or evidence of vascular compromise of the bowel loop. Only minimal hazy stranding of the herniated fat contents, certainly decreased from comparison. No distal colonic thickening or dilatation. Scattered colonic diverticula without focal inflammation to suggest diverticulitis. Vascular/Lymphatic: Atherosclerotic calcifications within the abdominal aorta and branch vessels. No aneurysm or ectasia. No enlarged abdominopelvic lymph nodes. Reproductive: Normal appearance of the uterus and adnexal structures. Other: Nonspecific lobular low-attenuation collection in the inferior aspect of the right pericolic gutter measuring up to 10 Hounsfield units (2/65 and measuring 2.2 x 2.7 x 2.8 cm in size (2/65, 6/41) is essentially unchanged from comparison in 2018. No abdominopelvic free air or fluid. Mild body wall edema. 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 Tiny fat containing umbilical hernia. No other bowel containing hernias. 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 No acute traumatic osseous abnormality. Bones of the pelvis are intact and congruent. Some remote posterolateral lower thoracic left rib deformities are unchanged from prior. IMPRESSION: CT head: 1. No acute intracranial abnormality. No significant scalp swelling or calvarial fracture. 2. Chronic  microvascular ischemic changes, parenchymal volume loss and intracranial atherosclerosis. CT cervical spine: 1. No acute fracture or traumatic listhesis of the cervical spine. 2. Multilevel degenerative changes of the cervical spine as described above. CT abdomen and pelvis: 1. No acute traumatic injury of the abdomen or pelvis. 2. Large right renal staghorn calculus with some mild urothelial thickening is overall similar to comparison study which appears at most only partially obstructive given lack of calyceal dilatation. Could correlate with features of urinalysis to exclude superimposed urinary tract infection. 3. Geographic region of hyperenhancement in the spleen with some increasingly cystic appearance centrally, measuring up to 2.4 cm in size, previously 1.8 cm on comparison contrast enhanced CT from 2018. Findings are favored to represent a benign process such as a hemangioma or lymphangioma though could be further evaluated with 6 month follow-up MRI given some slow interval change in imaging characteristics this recommendation follows ACR consensus guidelines: White Paper of  the ACR Incidental Findings Committee II on Splenic and Nodal Findings. J Am Coll Radiol [phone]. 4. Nonspecific lobular low-attenuation collection in the inferior aspect of the right pericolic gutter measuring up to 2.7 cm in size, essentially unchanged from comparison in comparison in 2018. Appearance and stability favors a benign etiology such as a seroma or lymphocele. 5. 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 No resulting mechanical obstruction or evidence of vascular compromise of the bowel loop. Some mild stranding in the herniated fat contents could reflect residual fat strangulation though is decreased from comparison. 6. Colonic diverticulosis without evidence of diverticulitis. 7. Mild body wall edema. 8. Aortic Atherosclerosis  (ICD10-I70.0). Electronically Signed   By: Lovena Le M.D.   On: 07/22/2020 20:01   DG Knee Complete 4 Views Right  Result Date: 07/22/2020 CLINICAL DATA:  Pain status post fall EXAM: RIGHT KNEE - COMPLETE 4+ VIEW COMPARISON:  April 03, 2012 FINDINGS: There is an acute periprosthetic fracture involving the supracondylar distal right femur most notably on the lateral aspect of the femur. There is a large joint effusion. There is an apparent new osteophyte arising from the upper pole of the patella. There is a subtle lucency coursing through this osteophyte suggestive of a nondisplaced fracture. There is soft tissue swelling about the knee. Vascular calcifications are noted. IMPRESSION: 1. Acute periprosthetic fracture involving the supracondylar distal right femur. 2. Lucency coursing through the osteophyte arising from the upper pole of the patella suggestive of a nondisplaced fracture. 3. Large joint effusion. 4. Soft tissue swelling about the knee. 5. Vascular calcifications. Electronically Signed   By: Constance Holster M.D.   On: 07/22/2020 18:42    Positive ROS: All other systems have been reviewed and were otherwise negative with the exception of those mentioned in the HPI and as above.  Physical Exam: General: Alert, no acute distress  MUSCULOSKELETAL: Right lower extremity: Patient skin is intact.  She has swelling particularly laterally over the distal thigh.  Her thigh and leg compartments are soft and compressible.  She has a well-healed previous total knee arthroplasty incision.  She has no tenderness over the patella.  Patient has tenderness over the distal femur.  She has palpable pedal pulses, intact sensation to light touch and intact motor function.   Assessment: Right periprosthetic distal femur fracture and patella fracture  Plan: I reviewed the patient's x-rays.  I contacted Dr. Marry Guan who also looked at the x-rays.  We are in agreement that nonoperative management is  appropriate for this patient given her minimally displaced distal femur fracture and nondisplaced patella fracture.  Patient will be nonweightbearing on the right lower extremity and should avoid knee flexion to avoid placing displacing forces on her patella.  Patient is in a knee immobilizer.  I personally adjusted this today to the appropriate height on the patient's thigh to provide stabilization to her fracture.  Patient should receive physical therapy evaluation for nonweightbearing restrictions on her right lower extremity.  She must use a walker for assistance with ambulation.  Patient must have her knee immobilizer on at all times.  The patient was unable to be discharged home from the emergency department.  She is being admitted for placement.  Patient will require intensive physical therapy at a skilled nursing facility upon discharge from the hospital.  Patient understood and agreed with this plan.  Patient will follow up with me in the office in approximately 1 to  2 weeks after discharge.   Thornton Park, MD    07/23/2020 1:57 PM

## 2020-07-23 NOTE — Progress Notes (Addendum)
PROGRESS NOTE    KIRSTON LUTY  ZSW:[id number] DOB: 1941-04-04 DOA: 07/22/2020 PCP: Crissie Figures, PA-C    Brief Narrative:  Regina Mathis is a 79 y.o. female with medical history significant of osteoporosis, osteoarthritis status post total knee replacement, hyperlipidemia, atrial fibrillation on chronic anticoagulation, hypertension, GERD, scoliosis, and valvular heart disease who sustained a mechanical fall at home.  Patient has had recurrent falls apparently lately.  Today however she fell and twisted her right knee.  She was brought in unable to walk and in severe pain.  Patient was found to have periprosthetic fracture of the right knee. Orthopedics was consulted 10/27- bp on low side, c/o rt hip pain   Consultants:   Orthopedics  Procedures:   Antimicrobials:   Rocephin   Subjective: C/o rt hip pain  Objective: Vitals:   07/23/20 0400 07/23/20 0700 07/23/20 0800 07/23/20 0930  BP: 137/62 (!) 115/59 (!) 104/52 107/69  Pulse: (!) 56 82 (!) 50 (!) 57  Resp: 16 18 16 18   Temp:      TempSrc:      SpO2: 94% 98% 94% 97%  Weight:      Height:        Intake/Output Summary (Last 24 hours) at 07/23/2020 1006 Last data filed at 07/23/2020 0457 Gross per 24 hour  Intake 100 ml  Output --  Net 100 ml   Filed Weights   07/22/20 1741  Weight: 67 kg    Examination:  General exam: Appears calm and comfortable  Respiratory system: Clear to auscultation. Respiratory effort normal. Cardiovascular system: S1 & S2 heard, RRR. 3/6 SEM LSB Gastrointestinal system: Abdomen is nondistended, soft and nontender. . Normal bowel sounds heard. Central nervous system: Alert and oriented x3. Grossly intact Extremities: no edema Skin: Warm dry Psychiatry: Judgement and insight appear normal. Mood & affect appropriate.     Data Reviewed: I have personally reviewed following labs and imaging studies  CBC: Recent Labs  Lab 07/22/20 1825 07/23/20 0326  WBC 9.8 4.4   NEUTROABS 8.5*  --   HGB 12.1 11.0*  HCT 37.4 33.1*  MCV 89.9 89.9  PLT 207 322   Basic Metabolic Panel: Recent Labs  Lab 07/22/20 1825 07/23/20 0326  NA 140 140  K 4.2 3.0*  CL 102 102  CO2 29 29  GLUCOSE 121* 119*  BUN 29* 25*  CREATININE 0.99 0.72  CALCIUM 9.2 8.6*   GFR: Estimated Creatinine Clearance: 52.9 mL/min (by C-G formula based on SCr of 0.72 mg/dL). Liver Function Tests: Recent Labs  Lab 07/22/20 1825 07/23/20 0326  AST 20 140*  ALT 14 53*  ALKPHOS 62 68  BILITOT 0.6 1.0  PROT 7.3 6.3*  ALBUMIN 4.2 3.7   No results for input(s): LIPASE, AMYLASE in the last 168 hours. No results for input(s): AMMONIA in the last 168 hours. Coagulation Profile: No results for input(s): INR, PROTIME in the last 168 hours. Cardiac Enzymes: No results for input(s): CKTOTAL, CKMB, CKMBINDEX, TROPONINI in the last 168 hours. BNP (last 3 results) No results for input(s): PROBNP in the last 8760 hours. HbA1C: No results for input(s): HGBA1C in the last 72 hours. CBG: No results for input(s): GLUCAP in the last 168 hours. Lipid Profile: No results for input(s): CHOL, HDL, LDLCALC, TRIG, CHOLHDL, LDLDIRECT in the last 72 hours. Thyroid Function Tests: No results for input(s): TSH, T4TOTAL, FREET4, T3FREE, THYROIDAB in the last 72 hours. Anemia Panel: No results for input(s): VITAMINB12, FOLATE, FERRITIN, TIBC, IRON, RETICCTPCT in  the last 72 hours. Sepsis Labs: No results for input(s): PROCALCITON, LATICACIDVEN in the last 168 hours.  Recent Results (from the past 240 hour(s))  Respiratory Panel by RT PCR (Flu A&B, Covid) - Nasopharyngeal Swab     Status: None   Collection Time: 07/22/20  9:05 PM   Specimen: Nasopharyngeal Swab  Result Value Ref Range Status   SARS Coronavirus 2 by RT PCR NEGATIVE NEGATIVE Final    Comment: (NOTE) SARS-CoV-2 target nucleic acids are NOT DETECTED.  The SARS-CoV-2 RNA is generally detectable in upper respiratoy specimens during the  acute phase of infection. The lowest concentration of SARS-CoV-2 viral copies this assay can detect is 131 copies/mL. A negative result does not preclude SARS-Cov-2 infection and should not be used as the sole basis for treatment or other patient management decisions. A negative result may occur with  improper specimen collection/handling, submission of specimen other than nasopharyngeal swab, presence of viral mutation(s) within the areas targeted by this assay, and inadequate number of viral copies (<131 copies/mL). A negative result must be combined with clinical observations, patient history, and epidemiological information. The expected result is Negative.  Fact Sheet for Patients:  PinkCheek.be  Fact Sheet for Healthcare Providers:  GravelBags.it  This test is no t yet approved or cleared by the [place] FDA and  has been authorized for detection and/or diagnosis of SARS-CoV-2 by FDA under an Emergency Use Authorization (EUA). This EUA will remain  in effect (meaning this test can be used) for the duration of the COVID-19 declaration under Section 564(b)(1) of the Act, 21 U.S.C. section 360bbb-3(b)(1), unless the authorization is terminated or revoked sooner.     Influenza A by PCR NEGATIVE NEGATIVE Final   Influenza B by PCR NEGATIVE NEGATIVE Final    Comment: (NOTE) The Xpert Xpress SARS-CoV-2/FLU/RSV assay is intended as an aid in  the diagnosis of influenza from Nasopharyngeal swab specimens and  should not be used as a sole basis for treatment. Nasal washings and  aspirates are unacceptable for Xpert Xpress SARS-CoV-2/FLU/RSV  testing.  Fact Sheet for Patients: PinkCheek.be  Fact Sheet for Healthcare Providers: GravelBags.it  This test is not yet approved or cleared by the [place] FDA and  has been authorized for detection and/or diagnosis  of SARS-CoV-2 by  FDA under an Emergency Use Authorization (EUA). This EUA will remain  in effect (meaning this test can be used) for the duration of the  Covid-19 declaration under Section 564(b)(1) of the Act, 21  U.S.C. section 360bbb-3(b)(1), unless the authorization is  terminated or revoked. Performed at [facility], [address], [place], [place] [address]          Radiology Studies: DG Wrist Complete Left  Result Date: 07/22/2020 CLINICAL DATA:  Pain EXAM: LEFT WRIST - COMPLETE 3+ VIEW COMPARISON:  None. FINDINGS: There is no evidence of fracture or dislocation. There is no evidence of arthropathy or other focal bone abnormality. Soft tissues are unremarkable. IMPRESSION: Negative. Electronically Signed   By: Constance Holster M.D.   On: 07/22/2020 18:43   CT Head Wo Contrast  Result Date: 07/22/2020 CLINICAL DATA:  Fall with positive head strike on concrete, diffuse pain, on anticoagulation EXAM: CT HEAD WITHOUT CONTRAST CT CERVICAL SPINE WITHOUT CONTRAST CT ABDOMEN AND PELVIS WITH CONTRAST TECHNIQUE: Contiguous axial images were obtained from the base of the skull through the vertex without intravenous contrast. Multidetector CT imaging of the cervical spine was performed without intravenous contrast. Multiplanar CT image reconstructions  were also generated. Multidetector CT imaging of the abdomen and pelvis was performed using the standard protocol following bolus administration of intravenous contrast. CONTRAST:  142mL OMNIPAQUE IOHEXOL 300 MG/ML  SOLN COMPARISON:  MR brain 01/31/2018, CT abdomen pelvis 09/01/2018, cervical spine radiograph 05/09/2013 FINDINGS: CT HEAD FINDINGS Brain: No evidence of acute infarction, hemorrhage, hydrocephalus, extra-axial collection, visible mass lesion or mass effect. Symmetric prominence of the ventricles, cisterns and sulci compatible with parenchymal volume loss. Patchy areas of white matter hypoattenuation are most compatible  with chronic microvascular angiopathy. Vascular: Atherosclerotic calcification of the carotid siphons and intradural vertebral arteries. No hyperdense vessel. Skull: No significant scalp swelling. No calvarial fracture or acute osseous injury. Sinuses/Orbits: Minimal thickening in the ethmoids. Paranasal sinuses and mastoid air cells are otherwise predominantly clear. Orbits are unremarkable. Other: None. CT CERVICAL SPINE FINDINGS Alignment: Cervical stabilization collar absent the time of examination. Mild focal reversal the normal cervical lordosis centered at C5-6 as well as some mild anterolisthesis C3 on 4 of approximately 3 mm,, favored to be on a degenerative basis with cervical spondylitic changes at this level. No evidence of traumatic listhesis. No abnormally widened, perched or jumped facets. Normal alignment of the craniocervical and atlantoaxial articulations accounting for rightward cranial rotation. Skull base and vertebrae: No acute skull base fracture. No vertebral body fracture or height loss. The osseous structures appear diffusely demineralized which may limit detection of small or nondisplaced fractures. Photon starvation artifact seen below C5 may further limit detection of subtle anomaly. Atlantodental arthrosis with calcific pannus formation, nonspecific though can be seen with CPPD or rheumatoid arthropathy. Multilevel spondylitic changes, as detailed below. Soft tissues and spinal canal: No pre or paravertebral fluid or swelling. No visible canal hematoma. Disc levels: Multilevel intervertebral disc height loss with spondylitic endplate changes. Larger disc osteophyte complexes present C4-5, C5-6 with some ligamentum flavum infolding result in at most mild canal stenosis best seen on the orthogonal reconstructions. Additional uncinate spurring and facet hypertrophic changes present throughout the cervical spine resulting in mild-to-moderate neural foraminal narrowing diffusely more  moderate to severe C4-C6. Upper chest: No acute abnormality in the upper chest or imaged lung apices. Other: Cervical carotid atherosclerosis. No worrisome thyroid nodules. CT ABDOMEN AND PELVIS FINDINGS Lower chest: Atelectatic changes present in the lung bases. Some additional scarring and/or subsegmental atelectasis seen in the right middle lobe and lingula. 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 Cardiac size is top normal. Three-vessel coronary artery atherosclerosis. No pericardial effusion. Hepatobiliary: No direct hepatic injury or perihepatic hematoma. No worrisome focal liver lesions. Smooth liver surface contour. Normal hepatic attenuation. Patient is post cholecystectomy. Prominence of the extrahepatic biliary tree with the common bile duct measuring up to 16 mm in diameter which is unchanged from prior and likely related to a combination of senescent change and post cholecystectomy reservoir effect. Pancreas: No pancreatic contusive change or ductal disruption. Mild pancreatic atrophy. No pancreatic ductal dilatation or surrounding inflammatory changes. Spleen: No acute splenic injury or perisplenic hemorrhage. Geographic region of hyperenhancement seen in the spleen with some increasingly cystic appearance centrally, measuring up to 2.4 cm, previously 1.8 cm on comparison contrast enhanced CT from 2018. No other focal splenic lesion. Normal splenic size. Adrenals/Urinary Tract: Nodular thickening of the adrenal glands is similar to comparisons. Bilateral renal cortical scarring is again noted. Kidneys enhance and excrete symmetrically. No direct renal injury or perinephric hemorrhage. No extravasation of contrast on excretory delayed  phase imaging. Large right renal staghorn calculus with some mild urothelial thickening is overall similar to comparison study which appears at most only partially obstructive  given lack of calyceal dilatation. No new urolithiasis or frank hydronephrosis is seen. Urinary bladder is unremarkable. Stomach/Bowel: Mild edematous thickening of the distal thoracic esophagus. Prominence of gastric rugal likely related to underdistention. Duodenum is unremarkable with normal sweep across the midline abdomen. No small bowel thickening or dilatation. Cecum displaced to the midline abdomen. 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 No resulting mechanical obstruction or evidence of vascular compromise of the bowel loop. Only minimal hazy stranding of the herniated fat contents, certainly decreased from comparison. No distal colonic thickening or dilatation. Scattered colonic diverticula without focal inflammation to suggest diverticulitis. Vascular/Lymphatic: Atherosclerotic calcifications within the abdominal aorta and branch vessels. No aneurysm or ectasia. No enlarged abdominopelvic lymph nodes. Reproductive: Normal appearance of the uterus and adnexal structures. Other: Nonspecific lobular low-attenuation collection in the inferior aspect of the right pericolic gutter measuring up to 10 Hounsfield units (2/65 and measuring 2.2 x 2.7 x 2.8 cm in size (2/65, 6/41) is essentially unchanged from comparison in 2018. No abdominopelvic free air or fluid. Mild body wall edema. 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 Tiny fat containing umbilical hernia. No other bowel containing hernias. 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 No acute traumatic osseous abnormality. Bones of the pelvis are intact and congruent. Some remote posterolateral lower thoracic left rib deformities are unchanged from prior. IMPRESSION: CT  head: 1. No acute intracranial abnormality. No significant scalp swelling or calvarial fracture. 2. Chronic microvascular ischemic changes, parenchymal volume loss and intracranial atherosclerosis. CT cervical spine: 1. No acute fracture or traumatic listhesis of the cervical spine. 2. Multilevel degenerative changes of the cervical spine as described above. CT abdomen and pelvis: 1. No acute traumatic injury of the abdomen or pelvis. 2. Large right renal staghorn calculus with some mild urothelial thickening is overall similar to comparison study which appears at most only partially obstructive given lack of calyceal dilatation. Could correlate with features of urinalysis to exclude superimposed urinary tract infection. 3. Geographic region of hyperenhancement in the spleen with some increasingly cystic appearance centrally, measuring up to 2.4 cm in size, previously 1.8 cm on comparison contrast enhanced CT from 2018. Findings are favored to represent a benign process such as a hemangioma or lymphangioma though could be further evaluated with 6 month follow-up MRI given some slow interval change in imaging characteristics this recommendation follows ACR consensus guidelines: White Paper of the ACR Incidental Findings Committee II on Splenic and Nodal Findings. J Am Coll Radiol [id number]. 4. Nonspecific lobular low-attenuation collection in the inferior aspect of the right pericolic gutter measuring up to 2.7 cm in size, essentially unchanged from comparison in comparison in 2018. Appearance and stability favors a benign etiology such as a seroma or lymphocele. 5. 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 No resulting mechanical obstruction or evidence of vascular compromise of the bowel loop. Some mild stranding in the herniated fat contents could reflect residual fat strangulation though is decreased from comparison. 6. Colonic  diverticulosis without evidence of diverticulitis. 7. Mild body wall edema. 8. Aortic Atherosclerosis (ICD10-I70.0).  Electronically Signed   By: Lovena Le M.D.   On: 07/22/2020 20:01   CT Cervical Spine Wo Contrast  Result Date: 07/22/2020 CLINICAL DATA:  Fall with positive head strike on concrete, diffuse pain, on anticoagulation EXAM: CT HEAD WITHOUT CONTRAST CT CERVICAL SPINE WITHOUT CONTRAST CT ABDOMEN AND PELVIS WITH CONTRAST TECHNIQUE: Contiguous axial images were obtained from the base of the skull through the vertex without intravenous contrast. Multidetector CT imaging of the cervical spine was performed without intravenous contrast. Multiplanar CT image reconstructions were also generated. Multidetector CT imaging of the abdomen and pelvis was performed using the standard protocol following bolus administration of intravenous contrast. CONTRAST:  150mL OMNIPAQUE IOHEXOL 300 MG/ML  SOLN COMPARISON:  MR brain 01/31/2018, CT abdomen pelvis 09/01/2018, cervical spine radiograph 05/09/2013 FINDINGS: CT HEAD FINDINGS Brain: No evidence of acute infarction, hemorrhage, hydrocephalus, extra-axial collection, visible mass lesion or mass effect. Symmetric prominence of the ventricles, cisterns and sulci compatible with parenchymal volume loss. Patchy areas of white matter hypoattenuation are most compatible with chronic microvascular angiopathy. Vascular: Atherosclerotic calcification of the carotid siphons and intradural vertebral arteries. No hyperdense vessel. Skull: No significant scalp swelling. No calvarial fracture or acute osseous injury. Sinuses/Orbits: Minimal thickening in the ethmoids. Paranasal sinuses and mastoid air cells are otherwise predominantly clear. Orbits are unremarkable. Other: None. CT CERVICAL SPINE FINDINGS Alignment: Cervical stabilization collar absent the time of examination. Mild focal reversal the normal cervical lordosis centered at C5-6 as well as some mild anterolisthesis  C3 on 4 of approximately 3 mm,, favored to be on a degenerative basis with cervical spondylitic changes at this level. No evidence of traumatic listhesis. No abnormally widened, perched or jumped facets. Normal alignment of the craniocervical and atlantoaxial articulations accounting for rightward cranial rotation. Skull base and vertebrae: No acute skull base fracture. No vertebral body fracture or height loss. The osseous structures appear diffusely demineralized which may limit detection of small or nondisplaced fractures. Photon starvation artifact seen below C5 may further limit detection of subtle anomaly. Atlantodental arthrosis with calcific pannus formation, nonspecific though can be seen with CPPD or rheumatoid arthropathy. Multilevel spondylitic changes, as detailed below. Soft tissues and spinal canal: No pre or paravertebral fluid or swelling. No visible canal hematoma. Disc levels: Multilevel intervertebral disc height loss with spondylitic endplate changes. Larger disc osteophyte complexes present C4-5, C5-6 with some ligamentum flavum infolding result in at most mild canal stenosis best seen on the orthogonal reconstructions. Additional uncinate spurring and facet hypertrophic changes present throughout the cervical spine resulting in mild-to-moderate neural foraminal narrowing diffusely more moderate to severe C4-C6. Upper chest: No acute abnormality in the upper chest or imaged lung apices. Other: Cervical carotid atherosclerosis. No worrisome thyroid nodules. CT ABDOMEN AND PELVIS FINDINGS Lower chest: Atelectatic changes present in the lung bases. Some additional scarring and/or subsegmental atelectasis seen in the right middle lobe and lingula. 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 Cardiac size is top normal. Three-vessel coronary artery atherosclerosis. No pericardial effusion. Hepatobiliary: No  direct hepatic injury or perihepatic hematoma. No worrisome focal liver lesions. Smooth liver surface contour. Normal hepatic attenuation. Patient is post cholecystectomy. Prominence of the extrahepatic biliary tree with the common bile duct measuring up to 16 mm in diameter which is unchanged from prior and likely related to a combination of senescent change and post cholecystectomy reservoir effect. Pancreas: No pancreatic contusive change or ductal disruption. Mild pancreatic atrophy. No pancreatic  ductal dilatation or surrounding inflammatory changes. Spleen: No acute splenic injury or perisplenic hemorrhage. Geographic region of hyperenhancement seen in the spleen with some increasingly cystic appearance centrally, measuring up to 2.4 cm, previously 1.8 cm on comparison contrast enhanced CT from 2018. No other focal splenic lesion. Normal splenic size. Adrenals/Urinary Tract: Nodular thickening of the adrenal glands is similar to comparisons. Bilateral renal cortical scarring is again noted. Kidneys enhance and excrete symmetrically. No direct renal injury or perinephric hemorrhage. No extravasation of contrast on excretory delayed phase imaging. Large right renal staghorn calculus with some mild urothelial thickening is overall similar to comparison study which appears at most only partially obstructive given lack of calyceal dilatation. No new urolithiasis or frank hydronephrosis is seen. Urinary bladder is unremarkable. Stomach/Bowel: Mild edematous thickening of the distal thoracic esophagus. Prominence of gastric rugal likely related to underdistention. Duodenum is unremarkable with normal sweep across the midline abdomen. No small bowel thickening or dilatation. Cecum displaced to the midline abdomen. 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 No resulting mechanical obstruction or evidence of vascular  compromise of the bowel loop. Only minimal hazy stranding of the herniated fat contents, certainly decreased from comparison. No distal colonic thickening or dilatation. Scattered colonic diverticula without focal inflammation to suggest diverticulitis. Vascular/Lymphatic: Atherosclerotic calcifications within the abdominal aorta and branch vessels. No aneurysm or ectasia. No enlarged abdominopelvic lymph nodes. Reproductive: Normal appearance of the uterus and adnexal structures. Other: Nonspecific lobular low-attenuation collection in the inferior aspect of the right pericolic gutter measuring up to 10 Hounsfield units (2/65 and measuring 2.2 x 2.7 x 2.8 cm in size (2/65, 6/41) is essentially unchanged from comparison in 2018. No abdominopelvic free air or fluid. Mild body wall edema. 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 Tiny fat containing umbilical hernia. No other bowel containing hernias. 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 No acute traumatic osseous abnormality. Bones of the pelvis are intact and congruent. Some remote posterolateral lower thoracic left rib deformities are unchanged from prior. IMPRESSION: CT head: 1. No acute intracranial abnormality. No significant scalp swelling or calvarial fracture. 2. Chronic microvascular ischemic changes, parenchymal volume loss and intracranial atherosclerosis. CT cervical spine: 1. No acute fracture or traumatic listhesis of the cervical spine. 2. Multilevel degenerative changes of the cervical spine as described above. CT abdomen and pelvis: 1. No acute traumatic injury of the abdomen or pelvis. 2. Large right renal staghorn calculus with some mild urothelial thickening is overall similar to comparison study which appears at most only partially obstructive given lack of  calyceal dilatation. Could correlate with features of urinalysis to exclude superimposed urinary tract infection. 3. Geographic region of hyperenhancement in the spleen with some increasingly cystic appearance centrally, measuring up to 2.4 cm in size, previously 1.8 cm on comparison contrast enhanced CT from 2018. Findings are favored to represent a benign process such as a hemangioma or lymphangioma though could be further evaluated with 6 month follow-up MRI given some slow interval change in imaging characteristics this recommendation follows ACR consensus guidelines: White Paper of the ACR Incidental Findings Committee II on Splenic and Nodal Findings. J Am Coll Radiol 2013;10:789-794. 4. Nonspecific lobular low-attenuation collection in the inferior aspect of the right pericolic gutter measuring up to 2.7 cm in  size, essentially unchanged from comparison in comparison in 2018. Appearance and stability favors a benign etiology such as a seroma or lymphocele. 5. 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 No resulting mechanical obstruction or evidence of vascular compromise of the bowel loop. Some mild stranding in the herniated fat contents could reflect residual fat strangulation though is decreased from comparison. 6. Colonic diverticulosis without evidence of diverticulitis. 7. Mild body wall edema. 8. Aortic Atherosclerosis (ICD10-I70.0). Electronically Signed   By: Lovena Le M.D.   On: 07/22/2020 20:01   CT ABDOMEN PELVIS W CONTRAST  Result Date: 07/22/2020 CLINICAL DATA:  Fall with positive head strike on concrete, diffuse pain, on anticoagulation EXAM: CT HEAD WITHOUT CONTRAST CT CERVICAL SPINE WITHOUT CONTRAST CT ABDOMEN AND PELVIS WITH CONTRAST TECHNIQUE: Contiguous axial images were obtained from the base of the skull through the vertex without intravenous contrast. Multidetector CT imaging of the cervical spine  was performed without intravenous contrast. Multiplanar CT image reconstructions were also generated. Multidetector CT imaging of the abdomen and pelvis was performed using the standard protocol following bolus administration of intravenous contrast. CONTRAST:  180mL OMNIPAQUE IOHEXOL 300 MG/ML  SOLN COMPARISON:  MR brain 01/31/2018, CT abdomen pelvis 09/01/2018, cervical spine radiograph 05/09/2013 FINDINGS: CT HEAD FINDINGS Brain: No evidence of acute infarction, hemorrhage, hydrocephalus, extra-axial collection, visible mass lesion or mass effect. Symmetric prominence of the ventricles, cisterns and sulci compatible with parenchymal volume loss. Patchy areas of white matter hypoattenuation are most compatible with chronic microvascular angiopathy. Vascular: Atherosclerotic calcification of the carotid siphons and intradural vertebral arteries. No hyperdense vessel. Skull: No significant scalp swelling. No calvarial fracture or acute osseous injury. Sinuses/Orbits: Minimal thickening in the ethmoids. Paranasal sinuses and mastoid air cells are otherwise predominantly clear. Orbits are unremarkable. Other: None. CT CERVICAL SPINE FINDINGS Alignment: Cervical stabilization collar absent the time of examination. Mild focal reversal the normal cervical lordosis centered at C5-6 as well as some mild anterolisthesis C3 on 4 of approximately 3 mm,, favored to be on a degenerative basis with cervical spondylitic changes at this level. No evidence of traumatic listhesis. No abnormally widened, perched or jumped facets. Normal alignment of the craniocervical and atlantoaxial articulations accounting for rightward cranial rotation. Skull base and vertebrae: No acute skull base fracture. No vertebral body fracture or height loss. The osseous structures appear diffusely demineralized which may limit detection of small or nondisplaced fractures. Photon starvation artifact seen below C5 may further limit detection of subtle  anomaly. Atlantodental arthrosis with calcific pannus formation, nonspecific though can be seen with CPPD or rheumatoid arthropathy. Multilevel spondylitic changes, as detailed below. Soft tissues and spinal canal: No pre or paravertebral fluid or swelling. No visible canal hematoma. Disc levels: Multilevel intervertebral disc height loss with spondylitic endplate changes. Larger disc osteophyte complexes present C4-5, C5-6 with some ligamentum flavum infolding result in at most mild canal stenosis best seen on the orthogonal reconstructions. Additional uncinate spurring and facet hypertrophic changes present throughout the cervical spine resulting in mild-to-moderate neural foraminal narrowing diffusely more moderate to severe C4-C6. Upper chest: No acute abnormality in the upper chest or imaged lung apices. Other: Cervical carotid atherosclerosis. No worrisome thyroid nodules. CT ABDOMEN AND PELVIS FINDINGS Lower chest: Atelectatic changes present in the lung bases. Some additional scarring and/or subsegmental atelectasis seen in the right middle lobe and lingula. Small fat containing right Bochdalek's hernia. 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 Cardiac size is top normal. Three-vessel coronary artery atherosclerosis. No pericardial effusion. Hepatobiliary: No direct hepatic injury or perihepatic hematoma. No worrisome focal liver lesions. Smooth liver surface contour. Normal hepatic attenuation. Patient is post cholecystectomy. Prominence of the extrahepatic biliary tree with the common bile duct measuring up to 16 mm in diameter which is unchanged from prior and likely related to a combination of senescent change and post cholecystectomy reservoir effect. Pancreas: No pancreatic contusive change or ductal disruption. Mild pancreatic atrophy. No pancreatic ductal dilatation or surrounding inflammatory changes. Spleen: No acute splenic injury or perisplenic  hemorrhage. Geographic region of hyperenhancement seen in the spleen with some increasingly cystic appearance centrally, measuring up to 2.4 cm, previously 1.8 cm on comparison contrast enhanced CT from 2018. No other focal splenic lesion. Normal splenic size. Adrenals/Urinary Tract: Nodular thickening of the adrenal glands is similar to comparisons. Bilateral renal cortical scarring is again noted. Kidneys enhance and excrete symmetrically. No direct renal injury or perinephric hemorrhage. No extravasation of contrast on excretory delayed phase imaging. Large right renal staghorn calculus with some mild urothelial thickening is overall similar to comparison study which appears at most only partially obstructive given lack of calyceal dilatation. No new urolithiasis or frank hydronephrosis is seen. Urinary bladder is unremarkable. Stomach/Bowel: Mild edematous thickening of the distal thoracic esophagus. Prominence of gastric rugal likely related to underdistention. Duodenum is unremarkable with normal sweep across the midline abdomen. No small bowel thickening or dilatation. Cecum displaced to the midline abdomen. 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 No resulting mechanical obstruction or evidence of vascular compromise of the bowel loop. Only minimal hazy stranding of the herniated fat contents, certainly decreased from comparison. No distal colonic thickening or dilatation. Scattered colonic diverticula without focal inflammation to suggest diverticulitis. Vascular/Lymphatic: Atherosclerotic calcifications within the abdominal aorta and branch vessels. No aneurysm or ectasia. No enlarged abdominopelvic lymph nodes. Reproductive: Normal appearance of the uterus and adnexal structures. Other: Nonspecific lobular low-attenuation collection in the inferior aspect of the right pericolic gutter measuring up to 10  Hounsfield units (2/65 and measuring 2.2 x 2.7 x 2.8 cm in size (2/65, 6/41) is essentially unchanged from comparison in 2018. No abdominopelvic free air or fluid. Mild body wall edema. 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 Tiny fat containing umbilical hernia. No other bowel containing hernias. 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 No acute traumatic osseous abnormality. Bones of the pelvis are intact and congruent. Some remote posterolateral lower thoracic left rib deformities are unchanged from prior. IMPRESSION: CT head: 1. No acute intracranial abnormality. No significant scalp swelling or calvarial fracture. 2. Chronic microvascular ischemic changes, parenchymal volume loss and intracranial atherosclerosis. CT cervical spine: 1. No acute fracture or traumatic listhesis of the cervical spine. 2. Multilevel degenerative changes of the cervical spine as described above. CT abdomen and pelvis: 1. No acute traumatic injury of the abdomen or pelvis. 2. Large right renal staghorn calculus with some mild urothelial thickening is overall similar to comparison study which appears at most only partially obstructive given lack of calyceal dilatation. Could correlate with features of urinalysis to exclude superimposed urinary tract infection. 3. Geographic region of hyperenhancement in the spleen with some increasingly cystic appearance centrally, measuring up to 2.4 cm in  size, previously 1.8 cm on comparison contrast enhanced CT from 2018. Findings are favored to represent a benign process such as a hemangioma or lymphangioma though could be further evaluated with 6 month follow-up MRI given some slow interval change in imaging characteristics this recommendation follows ACR consensus guidelines: White Paper of the ACR Incidental  Findings Committee II on Splenic and Nodal Findings. J Am Coll Radiol [phone]. 4. Nonspecific lobular low-attenuation collection in the inferior aspect of the right pericolic gutter measuring up to 2.7 cm in size, essentially unchanged from comparison in comparison in 2018. Appearance and stability favors a benign etiology such as a seroma or lymphocele. 5. 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 No resulting mechanical obstruction or evidence of vascular compromise of the bowel loop. Some mild stranding in the herniated fat contents could reflect residual fat strangulation though is decreased from comparison. 6. Colonic diverticulosis without evidence of diverticulitis. 7. Mild body wall edema. 8. Aortic Atherosclerosis (ICD10-I70.0). Electronically Signed   By: Lovena Le M.D.   On: 07/22/2020 20:01   DG Knee Complete 4 Views Right  Result Date: 07/22/2020 CLINICAL DATA:  Pain status post fall EXAM: RIGHT KNEE - COMPLETE 4+ VIEW COMPARISON:  April 03, 2012 FINDINGS: There is an acute periprosthetic fracture involving the supracondylar distal right femur most notably on the lateral aspect of the femur. There is a large joint effusion. There is an apparent new osteophyte arising from the upper pole of the patella. There is a subtle lucency coursing through this osteophyte suggestive of a nondisplaced fracture. There is soft tissue swelling about the knee. Vascular calcifications are noted. IMPRESSION: 1. Acute periprosthetic fracture involving the supracondylar distal right femur. 2. Lucency coursing through the osteophyte arising from the upper pole of the patella suggestive of a nondisplaced fracture. 3. Large joint effusion. 4. Soft tissue swelling about the knee. 5. Vascular calcifications. Electronically Signed   By: Constance Holster M.D.   On: 07/22/2020 18:42        Scheduled Meds: . enoxaparin (LOVENOX)  injection  40 mg Subcutaneous Q24H   Continuous Infusions: . sodium chloride 100 mL/hr at 07/23/20 0038  . cefTRIAXone (ROCEPHIN)  IV Stopped (07/23/20 0457)    Assessment & Plan:   Principal Problem:   Fall Active Problems:   Atrial fibrillation ([place])   Essential hypertension   Dementia ([place])   Hyperlipidemia   Primary osteoarthritis involving multiple joints   Displaced apophyseal fracture of right femur, initial encounter for closed fracture ([place])   #1 status post fall: Patient is having recurrent falls.  She probably has gait abnormalities.  PT OT recommended SNF    #2  Right periprosthetic fracture of the right knee:  Orthopedics was consulted input was appreciated -after reviewing the x-ray plan is to do nonoperative management given her minimally displaced distal femur fracture and nondisplaced patella fracture -Continue with knee immobilizer, nonweightbearing of the right lower extremity and should avoid knee flexion to avoid placing displacing forces on her patella Must use a walker for assistance with ambulation and must have her knee immobilizer at all times on Needs SNF Needs to follow-up with Dr. Mack Guise in 1 to 2 weeks after discharge  #3 atrial fibrillation:  Rate controlled. On Eliquis but will likely need to hold due to fall risk Can be reevaluated as outpatient in the near future  #4 dementia:  Stable.    #5 essential hypertension:  BP on  low side here Continue with IV fluids  #6 osteoarthritis of multiple joints:  Complaining of right hip pain due to arthritis Will place lidocaine patch  #7 hyperlipidemia:  Awaiting on confirmation of home meds  #8-UTI-  UA positive Continue with rocephin, f/u ucx  #9-murmur- will obtain echo  #10-hypokalemia-K is 3.0 We will replace Check a.m. levels   DVT prophylaxis: Lovenox Code Status: Full Family Communication: None at bedside  Status is: Inpatient  Remains inpatient appropriate  because:Unsafe d/c plan   Dispo: The patient is from: Home              Anticipated d/c is to: SNF              Anticipated d/c date is: 2 days              Patient currently is not medically stable to d/c.            LOS: 1 day   Time spent: 45 min with >50% on coc    Nolberto Hanlon, MD [facility] Pager [phone]  If 7PM-7AM, please contact night-coverage [email or web address] Password [email or web address] 07/23/2020, 10:06 AM

## 2020-07-23 NOTE — ED Notes (Addendum)
Called up to 1A to inform this RN is on way with pt, spoke with Freda Munro, Network engineer. Per 1A secretary Freda Munro, she will inform nurse of our arrival to floor at this time.

## 2020-07-23 NOTE — Evaluation (Signed)
Physical Therapy Evaluation Patient Details Name: Regina Mathis MRN: [id number] DOB: 11-05-1940 Today's Date: 07/23/2020   History of Present Illness  79 y.o. female with medical history significant of osteoporosis, osteoarthritis status post total knee replacement, hyperlipidemia, atrial fibrillation on chronic anticoagulation, hypertension, GERD, scoliosis, and valvular heart disease who sustained a fall at home.  Patient has had recurrent falls lately, this episode she fell and twisted her right knee.  She was brought in unable to walk and in severe pain.  Patient was found to have periprosthetic fracture of the right knee. Orthopedic recommends non-surgical conservative treatment.    Clinical Impression  Pt was eager to try and do some activity with PT but ultimately is (and will be) limited with KI and NWBing on the R.  She states that she has reliant on walker at all times (in and out of home) since TKAs 8-9 years ago.  She showed some effort with getting to sitting but ultimately needed heavy assist and had increasing pain in R knee with even minimal movement.  Pt was able to tolerate brief sitting bout, but we both agreed that getting to standing from the elevated ED bed and her pain/weakness and we deferred standing attempt at time of PT exam.  Pt understands that she will not be able to safely go home, but clearly is concerned about insuring that he sister is cared for.    Follow Up Recommendations SNF;Supervision/Assistance - 24 hour    Equipment Recommendations   (TBD, may need wheelchair)    Recommendations for Other Services       Precautions / Restrictions Precautions Precautions: Fall Precaution Comments: no knee flexion Required Braces or Orthoses: Knee Immobilizer - Right Knee Immobilizer - Right: On at all times Restrictions Weight Bearing Restrictions: Yes RLE Weight Bearing: Non weight bearing      Mobility  Bed Mobility Overal bed mobility: Needs  Assistance Bed Mobility: Supine to Sit;Sit to Supine     Supine to sit: Max assist Sit to supine: Max assist   General bed mobility comments: Pt showed good effort in trying to move R LE to EOB but ultimately needed heavy assist for LEs as well as lifting torso.  Pt able to help lower torso back to supine, but needed total assist with LEs    Transfers                 General transfer comment: deferred secondary to pain, R KI, bed height and weakness  Ambulation/Gait                Stairs            Wheelchair Mobility    Modified Rankin (Stroke Patients Only)       Balance Overall balance assessment: Needs assistance Sitting-balance support: Bilateral upper extremity supported Sitting balance-Leahy Scale: Good Sitting balance - Comments: Pt able to maintain sitting w/o assist - increased pain in gravity dependent position       Standing balance comment: deferred secondary to pain, positioning and lack of confidence maintaining NWBing                             Pertinent Vitals/Pain Pain Assessment: 0-10 Pain Score: 8  Pain Location: R knee Pain Intervention(s): Limited activity within patient's tolerance    Home Living Family/patient expects to be discharged to:: Private residence Living Arrangements: Other relatives (lives with sister, apparently h/o hemiplegia post CVA) Available  Help at Discharge:  (limited, pt is more caregiver for sister)   Home Access: Ramped entrance;Stairs to enter Entrance Stairs-Rails:  (yes) Entrance Stairs-Number of Steps: 4   Home Equipment: Walker - 2 wheels;Cane - single point      Prior Function Level of Independence: Independent with assistive device(s)         Comments: Pt resports she is out minimally for errands, MD appts     Hand Dominance        Extremity/Trunk Assessment   Upper Extremity Assessment Upper Extremity Assessment: Overall WFL for tasks assessed;Generalized  weakness (limited shld elevation but functional tricep strength)    Lower Extremity Assessment Lower Extremity Assessment: RLE deficits/detail (L LE grossly 4-/5) RLE Deficits / Details: unable to lift (hip flex) against gravity, pain with ABd more than ADd RLE: Unable to fully assess due to pain       Communication   Communication: No difficulties  Cognition Arousal/Alertness: Awake/alert Behavior During Therapy: WFL for tasks assessed/performed Overall Cognitive Status: Within Functional Limits for tasks assessed                                        General Comments General comments (skin integrity, edema, etc.): Pt is caregiver for sister, is anxious about insuring she is cared for    Exercises     Assessment/Plan    PT Assessment Patient needs continued PT services  PT Problem List Decreased range of motion;Decreased strength;Decreased activity tolerance;Decreased balance;Decreased mobility;Decreased knowledge of use of DME;Decreased safety awareness;Pain;Decreased knowledge of precautions;Cardiopulmonary status limiting activity       PT Treatment Interventions DME instruction;Gait training;Functional mobility training;Therapeutic activities;Therapeutic exercise;Balance training;Neuromuscular re-education;Patient/family education    PT Goals (Current goals can be found in the Care Plan section)  Acute Rehab PT Goals Patient Stated Goal: get back home with sister PT Goal Formulation: With patient Time For Goal Achievement: 08/06/20 Potential to Achieve Goals: Fair    Frequency Min 2X/week   Barriers to discharge        Co-evaluation               AM-PAC PT "6 Clicks" Mobility  Outcome Measure Help needed turning from your back to your side while in a flat bed without using bedrails?: A Lot Help needed moving from lying on your back to sitting on the side of a flat bed without using bedrails?: Total Help needed moving to and from a bed to  a chair (including a wheelchair)?: Total Help needed standing up from a chair using your arms (e.g., wheelchair or bedside chair)?: Total Help needed to walk in hospital room?: Total Help needed climbing 3-5 steps with a railing? : Total 6 Click Score: 7    End of Session Equipment Utilized During Treatment: Right knee immobilizer Activity Tolerance: Patient limited by pain Patient left: in bed;with call bell/phone within reach Nurse Communication: Mobility status PT Visit Diagnosis: Muscle weakness (generalized) (M62.81);Difficulty in walking, not elsewhere classified (R26.2);Pain Pain - Right/Left: Right Pain - part of body: Knee    Time: 0922-0953 PT Time Calculation (min) (ACUTE ONLY): 31 min   Charges:   PT Evaluation $PT Eval Low Complexity: 1 Low PT Treatments $Therapeutic Activity: 8-22 mins        Kreg Shropshire, DPT 07/23/2020, 1:48 PM

## 2020-07-23 NOTE — ED Notes (Signed)
Pt given phone to call sister at this time.

## 2020-07-23 NOTE — ED Notes (Signed)
Attempted to call report to 1A, informed RN taking pt is unavailable. Provided ascom number for callback.

## 2020-07-23 NOTE — ED Notes (Signed)
PT at bedside at this time 

## 2020-07-23 NOTE — TOC Initial Note (Signed)
Transition of Care [facility]) - Initial/Assessment Note    Patient Details  Name: Regina Mathis MRN: [id number] Date of Birth: Mar 05, 1941  [facility] CM/SW Contact:    Victorino Dike, RN Phone Number: 07/23/2020, 11:03 AM  Clinical Narrative:                  Met with patient in room.  She reports being nervous and overwhelmed.  Reports her knee is painful and she is worried for her sister for whom she is the caregiver for.   Patient provides care for her sister who is paralyzed/weakness on one side from a stroke, she reports driving, grocery shopping, runs errands, etc.  They do not have any other support available to them .  Patient mentioned a brother named Francee Piccolo, but they have not talked in a long time.   Patient understands that there will be a need for rehab for her to learn how to care for herself and get stronger with her current new condition.  She is open to rehab at this time if needed.   Expected Discharge Plan: Skilled Nursing Facility Barriers to Discharge: Continued Medical Work up   Patient Goals and CMS Choice Patient states their goals for this hospitalization and ongoing recovery are:: to return home and care for sister CMS Medicare.gov Compare Post Acute Care list provided to:: Patient Choice offered to / list presented to : Patient  Expected Discharge Plan and Services Expected Discharge Plan: [place]   Discharge Planning Services: CM Consult Post Acute Care Choice: [place] Living arrangements for the past 2 months: Single Family Home                                      Prior Living Arrangements/Services Living arrangements for the past 2 months: Single Family Home Lives with:: Self, Siblings   Do you feel safe going back to the place where you live?: Yes      Need for Family Participation in Patient Care: No (Comment) Care giver support system in place?: No (comment) Current home services:  DME (walker in home, walker in car, BSC, shower stool) Criminal Activity/Legal Involvement Pertinent to Current Situation/Hospitalization: No - Comment as needed  Activities of Daily Living      Permission Sought/Granted                  Emotional Assessment Appearance:: Appears stated age Attitude/Demeanor/Rapport: Engaged Affect (typically observed): Anxious, Overwhelmed, Pleasant, Accepting Orientation: : Oriented to Place, Oriented to Self, Oriented to  Time, Oriented to Situation Alcohol / Substance Use: Not Applicable Psych Involvement: No (comment)  Admission diagnosis:  Fall [W19.XXXA] Patient Active Problem List   Diagnosis Date Noted  . Fall 07/22/2020  . Displaced apophyseal fracture of right femur, initial encounter for closed fracture ([place]) 07/22/2020  . Bursitis 04/10/2020  . Plantarflexion deformity of left foot 04/10/2020  . 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 Nontraumatic complete tear of left rotator cuff 05/01/2018  . Rotator cuff arthropathy of left shoulder 05/01/2018  . Bruising 08/04/2017  . Bilateral leg edema 04/21/2017  . Atrial fibrillation ([place]) 03/28/2017  . Essential hypertension 03/28/2017  . Dementia ([place]) 03/28/2017  . Hyperlipidemia 03/28/2017  . Preventative health care 03/28/2017  . Primary osteoarthritis involving multiple joints 11/20/2015   PCP:  Crissie Figures, PA-C Pharmacy:   [place] ([facility] [place] -  [place] ([place]) [place] [address] Phone: [phone] Fax: [facility], [place] [address] [place]  [address] Phone: [phone] Fax: [facility] [place], [place] [address] Phone: [phone] Fax: [phone]     Social Determinants of Health (SDOH) Interventions    Readmission Risk Interventions No flowsheet  data found.

## 2020-07-24 ENCOUNTER — Inpatient Hospital Stay: Payer: Medicare HMO

## 2020-07-24 DIAGNOSIS — R7989 Other specified abnormal findings of blood chemistry: Secondary | ICD-10-CM

## 2020-07-24 DIAGNOSIS — W19XXXS Unspecified fall, sequela: Secondary | ICD-10-CM

## 2020-07-24 LAB — COMPREHENSIVE METABOLIC PANEL
ALT: 275 U/L — ABNORMAL HIGH (ref 0–44)
AST: 228 U/L — ABNORMAL HIGH (ref 15–41)
Albumin: 3.3 g/dL — ABNORMAL LOW (ref 3.5–5.0)
Alkaline Phosphatase: 97 U/L (ref 38–126)
Anion gap: 8 (ref 5–15)
BUN: 19 mg/dL (ref 8–23)
CO2: 27 mmol/L (ref 22–32)
Calcium: 8.5 mg/dL — ABNORMAL LOW (ref 8.9–10.3)
Chloride: 102 mmol/L (ref 98–111)
Creatinine, Ser: 0.66 mg/dL (ref 0.44–1.00)
GFR, Estimated: >60 mL/min (ref >60)
Glucose, Bld: 102 mg/dL — ABNORMAL HIGH (ref 70–99)
Potassium: 4.1 mmol/L (ref 3.5–5.1)
Sodium: 137 mmol/L (ref 135–145)
Total Bilirubin: 0.9 mg/dL (ref 0.3–1.2)
Total Protein: 5.9 g/dL — ABNORMAL LOW (ref 6.5–8.1)

## 2020-07-24 LAB — HEPATITIS PANEL, ACUTE
HCV Ab: NONREACTIVE
Hep A IgM: NONREACTIVE
Hep B C IgM: NONREACTIVE
Hepatitis B Surface Ag: NONREACTIVE

## 2020-07-24 LAB — ECHOCARDIOGRAM COMPLETE
AR max vel: 2.31 cm2
AV Area VTI: 2.07 cm2
AV Area mean vel: 2.21 cm2
AV Mean grad: 9.3 mmHg
AV Peak grad: 18.2 mmHg
Ao pk vel: 2.13 m/s
Area-P 1/2: 3.17 cm2
Height: 62 in
S' Lateral: 3.25 cm
Weight: 2363.33 oz

## 2020-07-24 LAB — MAGNESIUM: Magnesium: 1.8 mg/dL (ref 1.7–2.4)

## 2020-07-24 MED ORDER — BISACODYL 5 MG PO TBEC: 5.0000 mg | DELAYED_RELEASE_TABLET | Freq: Every day | ORAL | Status: DC | PRN

## 2020-07-24 MED ORDER — ASPIRIN EC 81 MG PO TBEC
81.0000 mg | DELAYED_RELEASE_TABLET | Freq: Every day | ORAL | Status: DC
  Administered 2020-07-24 – 2020-07-25 (×2): 81 mg via ORAL
  Filled 2020-07-24 (×2): qty 1

## 2020-07-24 MED ORDER — MAGNESIUM HYDROXIDE 400 MG/5ML PO SUSP
15.0000 mL | Freq: Once | ORAL | Status: AC
  Administered 2020-07-24: 15 mL via ORAL
  Filled 2020-07-24: qty 30

## 2020-07-24 NOTE — TOC Progression Note (Signed)
Transition of Care [facility]) - Progression Note    Patient Details  Name: Regina Mathis MRN: [id number] Date of Birth: [birth year]  Transition of Care [facility]) CM/SW [place], RN Phone Number: 07/24/2020, 3:37 PM  Clinical Narrative:   RNCM met with patient at bedside to discuss bed offer. Discussed with patient that she has a bed offer from [facility] for which patient is agreeable. Provided patient with Medicare.gov ratings. Patient again reports she is agreeable to placement at [facility]. RNCM accepted be in the hub and started insurance authorization through [employer] portal. Ref# [id number].     Expected Discharge Plan: [place] Barriers to Discharge: Continued Medical Work up  Expected Discharge Plan and Services Expected Discharge Plan: [place]   Discharge Planning Services: CM Consult Post Acute Care Choice: [place] Living arrangements for the past 2 months: Single Family Home                                       Social Determinants of Health (SDOH) Interventions    Readmission Risk Interventions No flowsheet data found.

## 2020-07-24 NOTE — Progress Notes (Signed)
Physical Therapy Treatment Patient Details Name: Regina Mathis MRN: [id number] DOB: 04/13/1941 Today's Date: 07/24/2020    History of Present Illness 79 y.o. female with medical history significant of osteoporosis, osteoarthritis status post total knee replacement, hyperlipidemia, atrial fibrillation on chronic anticoagulation, hypertension, GERD, scoliosis, and valvular heart disease who sustained a fall at home.  Patient has had recurrent falls lately, this episode she fell and twisted her right knee.  She was brought in unable to walk and in severe pain.  Patient was found to have periprosthetic fracture of the right knee. Orthopedic recommends non-surgical conservative treatment.      PT Comments    Pt very pleasant and eager to do all she could, but ultimately she was very limited with standing acts as she was unable (even with +2 assist) to keep weight off of R LE.  She was unable to get weight over the walker appropriately and though she again showed good effort even with heavy assist (and direct assist to keep some weight off R) could not manage any heel-toe/scooting/hopping in standing.  Pt did well with exercises and with warm up and gentle encouragement did make gains with increased reps.   Follow Up Recommendations  SNF;Supervision/Assistance - 24 hour     Equipment Recommendations   (TBD)    Recommendations for Other Services       Precautions / Restrictions Precautions Precautions: Fall Knee Immobilizer - Right: On at all times Restrictions RLE Weight Bearing: Non weight bearing    Mobility  Bed Mobility Overal bed mobility: Needs Assistance Bed Mobility: Supine to Sit;Sit to Supine     Supine to sit: Max assist;Mod assist Sit to supine: Max assist;Mod assist   General bed mobility comments: Pt able to give some effort with EOB mobility but ultimately needed considerable assist up from and and back down to supine  Transfers Overall transfer level: Needs  assistance Equipment used: Rolling walker (2 wheeled) Transfers: Sit to/from Stand Sit to Stand: Mod assist;From elevated surface         General transfer comment: 2 standing attempts with heavy cuing for NWBing on R and appropraite use of UEs.  Pt showed good effort and did manage to give some assist, however she struggled to keep weight off of R and frankly even with direct assist to unweight R LE did put some through it on each attempt despite appropriate walker height and understanding of goals/precautions of the effort  Ambulation/Gait                 Stairs             Wheelchair Mobility    Modified Rankin (Stroke Patients Only)       Balance Overall balance assessment: Needs assistance Sitting-balance support: Bilateral upper extremity supported Sitting balance-Leahy Scale: Fair     Standing balance support: Bilateral upper extremity supported Standing balance-Leahy Scale: Poor Standing balance comment: Pt was actually able to maintain standing w/o heavy assist, however she clearly was putting some weight through R LE and did not have the strength to unweight needing heavy assist from PT to insure she did not do more than TDWBing                            Cognition Arousal/Alertness: Awake/alert Behavior During Therapy: WFL for tasks assessed/performed Overall Cognitive Status: Within Functional Limits for tasks assessed  Exercises General Exercises - Lower Extremity Ankle Circles/Pumps: AROM;10 reps (lightly resisted R ankle DF) Quad Sets: Strengthening;15 reps (in KI on R) Hip ABduction/ADduction: AROM;AAROM;10 reps Straight Leg Raises: AAROM;Right;20 reps (2 sets of 10 AAROM, AROM on last 2 with great effort)    General Comments        Pertinent Vitals/Pain Pain Assessment: Faces Faces Pain Scale: Hurts even more Pain Location: less hyper responsive today to movement, but  considerable pain with any activity    Home Living                      Prior Function            PT Goals (current goals can now be found in the care plan section) Progress towards PT goals: Progressing toward goals    Frequency    Min 2X/week      PT Plan Current plan remains appropriate    Co-evaluation              AM-PAC PT "6 Clicks" Mobility   Outcome Measure  Help needed turning from your back to your side while in a flat bed without using bedrails?: A Lot Help needed moving from lying on your back to sitting on the side of a flat bed without using bedrails?: Total Help needed moving to and from a bed to a chair (including a wheelchair)?: Total Help needed standing up from a chair using your arms (e.g., wheelchair or bedside chair)?: Total Help needed to walk in hospital room?: Total Help needed climbing 3-5 steps with a railing? : Total 6 Click Score: 7    End of Session Equipment Utilized During Treatment: Right knee immobilizer Activity Tolerance: Patient limited by pain Patient left: in bed;with call bell/phone within reach Nurse Communication: Mobility status PT Visit Diagnosis: Muscle weakness (generalized) (M62.81);Difficulty in walking, not elsewhere classified (R26.2);Pain Pain - Right/Left: Right Pain - part of body: Knee     Time: 6387-5643 PT Time Calculation (min) (ACUTE ONLY): 29 min  Charges:  $Therapeutic Exercise: 8-22 mins $Therapeutic Activity: 8-22 mins                     Kreg Shropshire, DPT 07/24/2020, 4:21 PM

## 2020-07-24 NOTE — NC FL2 (Signed)
[place] LEVEL OF CARE SCREENING TOOL     IDENTIFICATION  Patient Name: Regina Mathis Birthdate: 1941-07-21 Sex: female Admission Date (Current Location): 07/22/2020  [facility] and [place] Number:  Engineering geologist and Address:  [facility], [address], [place], [place] [address]      Provider Number: [id number]  Attending Physician Name and Address:  Nolberto Hanlon, MD  Relative Name and Phone Number:  Sammuel Cooper [phone]    Current Level of Care: Hospital Recommended Level of Care: [place] Prior Approval Number:    Date Approved/Denied:   PASRR Number: [id number]  Discharge Plan:      Current Diagnoses: Patient Active Problem List   Diagnosis Date Noted   Fall 07/22/2020   Displaced apophyseal fracture of right femur, initial encounter for closed fracture ([place]) 07/22/2020   Bursitis 04/10/2020   Plantarflexion deformity of left foot 04/10/2020   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   Nontraumatic complete tear of left rotator cuff 05/01/2018   Rotator cuff arthropathy of left shoulder 05/01/2018   Bruising 08/04/2017   Bilateral leg edema 04/21/2017   Atrial fibrillation ([place]) 03/28/2017   Essential hypertension 03/28/2017   Dementia ([place]) 03/28/2017   Hyperlipidemia 03/28/2017   Preventative health care 03/28/2017   Primary osteoarthritis involving multiple joints 11/20/2015    Orientation RESPIRATION BLADDER Height & Weight     Self, Time, Place, Situation  Normal External catheter Weight: 67 kg Height:  5\' 2"  (157.5 cm)  BEHAVIORAL SYMPTOMS/MOOD NEUROLOGICAL BOWEL NUTRITION STATUS      Continent Diet (Heart Healthy)  AMBULATORY STATUS COMMUNICATION OF NEEDS Skin   Extensive Assist Verbally Normal                       Personal Care Assistance Level of Assistance  Bathing, Feeding, Dressing Bathing Assistance: Limited assistance Feeding assistance: Independent Dressing  Assistance: Limited assistance     Functional Limitations Info             [place]  PT (By licensed PT), OT (By licensed OT)                    Contractures      Additional Factors Info  Allergies, Code Status Code Status Info: Full Allergies Info: No known allergies           Current Medications (07/24/2020):  This is the current hospital active medication list Current Facility-Administered Medications  Medication Dose Route Frequency Provider Last Rate Last Admin   0.45 % sodium chloride infusion   Intravenous Continuous Elwyn Reach, MD 100 mL/hr at 07/24/20 0915 New Bag at 07/24/20 0915   acetaminophen (TYLENOL) tablet 650 mg  650 mg Oral Q6H PRN Elwyn Reach, MD   650 mg at 07/23/20 1203   Or   acetaminophen (TYLENOL) suppository 650 mg  650 mg Rectal Q6H PRN Elwyn Reach, MD       cefTRIAXone (ROCEPHIN) 1 g in sodium chloride 0.9 % 100 mL IVPB  1 g Intravenous Q24H Sharion Settler, NP   Stopped at 07/23/20 0457   enoxaparin (LOVENOX) injection 40 mg  40 mg Subcutaneous Q24H Garba, Mohammad L, MD   40 mg at 07/23/20 2321   lidocaine (LIDODERM) 5 % 1 patch  1 patch Transdermal Q24H Nolberto Hanlon, MD       morphine 2 MG/ML injection 2 mg  2 mg Intravenous Q2H PRN Elwyn Reach, MD  2 mg at 07/23/20 0736   ondansetron (ZOFRAN) tablet 4 mg  4 mg Oral Q6H PRN Elwyn Reach, MD       Or   ondansetron (ZOFRAN) injection 4 mg  4 mg Intravenous Q6H PRN Elwyn Reach, MD   4 mg at 07/23/20 3014     Discharge Medications: Please see discharge summary for a list of discharge medications.  Relevant Imaging Results:  Relevant Lab Results:   Additional Information SS# [id number]  Shelbie Ammons, RN

## 2020-07-24 NOTE — Progress Notes (Addendum)
PROGRESS NOTE    Regina Mathis  GUR:[id number] DOB: 1940/11/07 DOA: 07/22/2020 PCP: Crissie Figures, PA-C    Brief Narrative:  Regina Mathis is a 79 y.o. female with medical history significant of osteoporosis, osteoarthritis status post total knee replacement, hyperlipidemia, atrial fibrillation on chronic anticoagulation, hypertension, GERD, scoliosis, and valvular heart disease who sustained a mechanical fall at home.  Patient has had recurrent falls apparently lately.  Today however she fell and twisted her right knee.  She was brought in unable to walk and in severe pain.  Patient was found to have periprosthetic fracture of the right knee. Orthopedics was consulted  10/28- no overnight issues.   Consultants:   Orthopedics  Procedures:   Antimicrobials:   Rocephin   Subjective: Has no new complaints. No n/v/abd pain   Objective: Vitals:   07/23/20 1750 07/23/20 2141 07/24/20 0155 07/24/20 0751  BP: (!) 149/62 (!) 145/53 (!) 148/65 (!) 167/75  Pulse: (!) 57 (!) 56 (!) 53 (!) 53  Resp: 15 15 16 17   Temp: 98.1 F (36.7 C) 97.6 F (36.4 C) 98.2 F (36.8 C) 98.4 F (36.9 C)  TempSrc:  Oral Oral Oral  SpO2: 95% 98% 99% 93%  Weight:      Height:        Intake/Output Summary (Last 24 hours) at 07/24/2020 0756 Last data filed at 07/24/2020 0157 Gross per 24 hour  Intake 1205.43 ml  Output 1200 ml  Net 5.43 ml   Filed Weights   07/22/20 1741  Weight: 67 kg    Examination: Calm, NAD CTA, no wheeze rales rhonchi's Regular S1-S2 2 out of 6 systolic murmur left sternal base Abdomen soft nontender nondistended positive bowel sounds Alert oriented x3 grossly intact No edema Mood and affect appropriate in current setting   Data Reviewed: I have personally reviewed following labs and imaging studies  CBC: Recent Labs  Lab 07/22/20 1825 07/23/20 0326  WBC 9.8 4.4  NEUTROABS 8.5*  --   HGB 12.1 11.0*  HCT 37.4 33.1*  MCV 89.9 89.9  PLT 207 283    Basic Metabolic Panel: Recent Labs  Lab 07/22/20 1825 07/23/20 0326 07/24/20 0432  NA 140 140 137  K 4.2 3.0* 4.1  CL 102 102 102  CO2 29 29 27   GLUCOSE 121* 119* 102*  BUN 29* 25* 19  CREATININE 0.99 0.72 0.66  CALCIUM 9.2 8.6* 8.5*  MG  --   --  1.8   GFR: Estimated Creatinine Clearance: 52.9 mL/min (by C-G formula based on SCr of 0.66 mg/dL). Liver Function Tests: Recent Labs  Lab 07/22/20 1825 07/23/20 0326 07/24/20 0432  AST 20 140* 228*  ALT 14 53* 275*  ALKPHOS 62 68 97  BILITOT 0.6 1.0 0.9  PROT 7.3 6.3* 5.9*  ALBUMIN 4.2 3.7 3.3*   No results for input(s): LIPASE, AMYLASE in the last 168 hours. No results for input(s): AMMONIA in the last 168 hours. Coagulation Profile: No results for input(s): INR, PROTIME in the last 168 hours. Cardiac Enzymes: No results for input(s): CKTOTAL, CKMB, CKMBINDEX, TROPONINI in the last 168 hours. BNP (last 3 results) No results for input(s): PROBNP in the last 8760 hours. HbA1C: No results for input(s): HGBA1C in the last 72 hours. CBG: No results for input(s): GLUCAP in the last 168 hours. Lipid Profile: No results for input(s): CHOL, HDL, LDLCALC, TRIG, CHOLHDL, LDLDIRECT in the last 72 hours. Thyroid Function Tests: No results for input(s): TSH, T4TOTAL, FREET4, T3FREE, THYROIDAB in the  last 72 hours. Anemia Panel: No results for input(s): VITAMINB12, FOLATE, FERRITIN, TIBC, IRON, RETICCTPCT in the last 72 hours. Sepsis Labs: No results for input(s): PROCALCITON, LATICACIDVEN in the last 168 hours.  Recent Results (from the past 240 hour(s))  Respiratory Panel by RT PCR (Flu A&B, Covid) - Nasopharyngeal Swab     Status: None   Collection Time: 07/22/20  9:05 PM   Specimen: Nasopharyngeal Swab  Result Value Ref Range Status   SARS Coronavirus 2 by RT PCR NEGATIVE NEGATIVE Final    Comment: (NOTE) SARS-CoV-2 target nucleic acids are NOT DETECTED.  The SARS-CoV-2 RNA is generally detectable in upper  respiratoy specimens during the acute phase of infection. The lowest concentration of SARS-CoV-2 viral copies this assay can detect is 131 copies/mL. A negative result does not preclude SARS-Cov-2 infection and should not be used as the sole basis for treatment or other patient management decisions. A negative result may occur with  improper specimen collection/handling, submission of specimen other than nasopharyngeal swab, presence of viral mutation(s) within the areas targeted by this assay, and inadequate number of viral copies (<131 copies/mL). A negative result must be combined with clinical observations, patient history, and epidemiological information. The expected result is Negative.  Fact Sheet for Patients:  PinkCheek.be  Fact Sheet for Healthcare Providers:  GravelBags.it  This test is no t yet approved or cleared by the [place] FDA and  has been authorized for detection and/or diagnosis of SARS-CoV-2 by FDA under an Emergency Use Authorization (EUA). This EUA will remain  in effect (meaning this test can be used) for the duration of the COVID-19 declaration under Section 564(b)(1) of the Act, 21 U.S.C. section 360bbb-3(b)(1), unless the authorization is terminated or revoked sooner.     Influenza A by PCR NEGATIVE NEGATIVE Final   Influenza B by PCR NEGATIVE NEGATIVE Final    Comment: (NOTE) The Xpert Xpress SARS-CoV-2/FLU/RSV assay is intended as an aid in  the diagnosis of influenza from Nasopharyngeal swab specimens and  should not be used as a sole basis for treatment. Nasal washings and  aspirates are unacceptable for Xpert Xpress SARS-CoV-2/FLU/RSV  testing.  Fact Sheet for Patients: PinkCheek.be  Fact Sheet for Healthcare Providers: GravelBags.it  This test is not yet approved or cleared by the [place] FDA and  has been  authorized for detection and/or diagnosis of SARS-CoV-2 by  FDA under an Emergency Use Authorization (EUA). This EUA will remain  in effect (meaning this test can be used) for the duration of the  Covid-19 declaration under Section 564(b)(1) of the Act, 21  U.S.C. section 360bbb-3(b)(1), unless the authorization is  terminated or revoked. Performed at [facility], [address], [place], [place] [address]          Radiology Studies: DG Wrist Complete Left  Result Date: 07/22/2020 CLINICAL DATA:  Pain EXAM: LEFT WRIST - COMPLETE 3+ VIEW COMPARISON:  None. FINDINGS: There is no evidence of fracture or dislocation. There is no evidence of arthropathy or other focal bone abnormality. Soft tissues are unremarkable. IMPRESSION: Negative. Electronically Signed   By: Constance Holster M.D.   On: 07/22/2020 18:43   CT Head Wo Contrast  Result Date: 07/22/2020 CLINICAL DATA:  Fall with positive head strike on concrete, diffuse pain, on anticoagulation EXAM: CT HEAD WITHOUT CONTRAST CT CERVICAL SPINE WITHOUT CONTRAST CT ABDOMEN AND PELVIS WITH CONTRAST TECHNIQUE: Contiguous axial images were obtained from the base of the skull through the vertex without intravenous contrast.  Multidetector CT imaging of the cervical spine was performed without intravenous contrast. Multiplanar CT image reconstructions were also generated. Multidetector CT imaging of the abdomen and pelvis was performed using the standard protocol following bolus administration of intravenous contrast. CONTRAST:  119mL OMNIPAQUE IOHEXOL 300 MG/ML  SOLN COMPARISON:  MR brain 01/31/2018, CT abdomen pelvis 09/01/2018, cervical spine radiograph 05/09/2013 FINDINGS: CT HEAD FINDINGS Brain: No evidence of acute infarction, hemorrhage, hydrocephalus, extra-axial collection, visible mass lesion or mass effect. Symmetric prominence of the ventricles, cisterns and sulci compatible with parenchymal volume loss. Patchy areas of white  matter hypoattenuation are most compatible with chronic microvascular angiopathy. Vascular: Atherosclerotic calcification of the carotid siphons and intradural vertebral arteries. No hyperdense vessel. Skull: No significant scalp swelling. No calvarial fracture or acute osseous injury. Sinuses/Orbits: Minimal thickening in the ethmoids. Paranasal sinuses and mastoid air cells are otherwise predominantly clear. Orbits are unremarkable. Other: None. CT CERVICAL SPINE FINDINGS Alignment: Cervical stabilization collar absent the time of examination. Mild focal reversal the normal cervical lordosis centered at C5-6 as well as some mild anterolisthesis C3 on 4 of approximately 3 mm,, favored to be on a degenerative basis with cervical spondylitic changes at this level. No evidence of traumatic listhesis. No abnormally widened, perched or jumped facets. Normal alignment of the craniocervical and atlantoaxial articulations accounting for rightward cranial rotation. Skull base and vertebrae: No acute skull base fracture. No vertebral body fracture or height loss. The osseous structures appear diffusely demineralized which may limit detection of small or nondisplaced fractures. Photon starvation artifact seen below C5 may further limit detection of subtle anomaly. Atlantodental arthrosis with calcific pannus formation, nonspecific though can be seen with CPPD or rheumatoid arthropathy. Multilevel spondylitic changes, as detailed below. Soft tissues and spinal canal: No pre or paravertebral fluid or swelling. No visible canal hematoma. Disc levels: Multilevel intervertebral disc height loss with spondylitic endplate changes. Larger disc osteophyte complexes present C4-5, C5-6 with some ligamentum flavum infolding result in at most mild canal stenosis best seen on the orthogonal reconstructions. Additional uncinate spurring and facet hypertrophic changes present throughout the cervical spine resulting in mild-to-moderate neural  foraminal narrowing diffusely more moderate to severe C4-C6. Upper chest: No acute abnormality in the upper chest or imaged lung apices. Other: Cervical carotid atherosclerosis. No worrisome thyroid nodules. CT ABDOMEN AND PELVIS FINDINGS Lower chest: Atelectatic changes present in the lung bases. Some additional scarring and/or subsegmental atelectasis seen in the right middle lobe and lingula. 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 Cardiac size is top normal. Three-vessel coronary artery atherosclerosis. No pericardial effusion. Hepatobiliary: No direct hepatic injury or perihepatic hematoma. No worrisome focal liver lesions. Smooth liver surface contour. Normal hepatic attenuation. Patient is post cholecystectomy. Prominence of the extrahepatic biliary tree with the common bile duct measuring up to 16 mm in diameter which is unchanged from prior and likely related to a combination of senescent change and post cholecystectomy reservoir effect. Pancreas: No pancreatic contusive change or ductal disruption. Mild pancreatic atrophy. No pancreatic ductal dilatation or surrounding inflammatory changes. Spleen: No acute splenic injury or perisplenic hemorrhage. Geographic region of hyperenhancement seen in the spleen with some increasingly cystic appearance centrally, measuring up to 2.4 cm, previously 1.8 cm on comparison contrast enhanced CT from 2018. No other focal splenic lesion. Normal splenic size. Adrenals/Urinary Tract: Nodular thickening of the adrenal glands is similar to comparisons. Bilateral renal cortical scarring is again noted. Kidneys enhance and  excrete symmetrically. No direct renal injury or perinephric hemorrhage. No extravasation of contrast on excretory delayed phase imaging. Large right renal staghorn calculus with some mild urothelial thickening is overall similar to comparison study which appears at  most only partially obstructive given lack of calyceal dilatation. No new urolithiasis or frank hydronephrosis is seen. Urinary bladder is unremarkable. Stomach/Bowel: Mild edematous thickening of the distal thoracic esophagus. Prominence of gastric rugal likely related to underdistention. Duodenum is unremarkable with normal sweep across the midline abdomen. No small bowel thickening or dilatation. Cecum displaced to the midline abdomen. 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 No resulting mechanical obstruction or evidence of vascular compromise of the bowel loop. Only minimal hazy stranding of the herniated fat contents, certainly decreased from comparison. No distal colonic thickening or dilatation. Scattered colonic diverticula without focal inflammation to suggest diverticulitis. Vascular/Lymphatic: Atherosclerotic calcifications within the abdominal aorta and branch vessels. No aneurysm or ectasia. No enlarged abdominopelvic lymph nodes. Reproductive: Normal appearance of the uterus and adnexal structures. Other: Nonspecific lobular low-attenuation collection in the inferior aspect of the right pericolic gutter measuring up to 10 Hounsfield units (2/65 and measuring 2.2 x 2.7 x 2.8 cm in size (2/65, 6/41) is essentially unchanged from comparison in 2018. No abdominopelvic free air or fluid. Mild body wall edema. 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 Tiny fat containing umbilical hernia. No other bowel containing hernias. 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 No acute traumatic osseous abnormality. Bones of the pelvis are intact and congruent. Some remote posterolateral lower thoracic left rib deformities are unchanged  from prior. IMPRESSION: CT head: 1. No acute intracranial abnormality. No significant scalp swelling or calvarial fracture. 2. Chronic microvascular ischemic changes, parenchymal volume loss and intracranial atherosclerosis. CT cervical spine: 1. No acute fracture or traumatic listhesis of the cervical spine. 2. Multilevel degenerative changes of the cervical spine as described above. CT abdomen and pelvis: 1. No acute traumatic injury of the abdomen or pelvis. 2. Large right renal staghorn calculus with some mild urothelial thickening is overall similar to comparison study which appears at most only partially obstructive given lack of calyceal dilatation. Could correlate with features of urinalysis to exclude superimposed urinary tract infection. 3. Geographic region of hyperenhancement in the spleen with some increasingly cystic appearance centrally, measuring up to 2.4 cm in size, previously 1.8 cm on comparison contrast enhanced CT from 2018. Findings are favored to represent a benign process such as a hemangioma or lymphangioma though could be further evaluated with 6 month follow-up MRI given some slow interval change in imaging characteristics this recommendation follows ACR consensus guidelines: White Paper of the ACR Incidental Findings Committee II on Splenic and Nodal Findings. J Am Coll Radiol [phone]. 4. Nonspecific lobular low-attenuation collection in the inferior aspect of the right pericolic gutter measuring up to 2.7 cm in size, essentially unchanged from comparison in comparison in 2018. Appearance and stability favors a benign etiology such as a seroma or lymphocele. 5. 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 No resulting mechanical obstruction or evidence of vascular compromise of the bowel loop. Some mild stranding in the herniated fat contents could reflect residual fat strangulation though is decreased  from comparison.  6. Colonic diverticulosis without evidence of diverticulitis. 7. Mild body wall edema. 8. Aortic Atherosclerosis (ICD10-I70.0). Electronically Signed   By: Lovena Le M.D.   On: 07/22/2020 20:01   CT Cervical Spine Wo Contrast  Result Date: 07/22/2020 CLINICAL DATA:  Fall with positive head strike on concrete, diffuse pain, on anticoagulation EXAM: CT HEAD WITHOUT CONTRAST CT CERVICAL SPINE WITHOUT CONTRAST CT ABDOMEN AND PELVIS WITH CONTRAST TECHNIQUE: Contiguous axial images were obtained from the base of the skull through the vertex without intravenous contrast. Multidetector CT imaging of the cervical spine was performed without intravenous contrast. Multiplanar CT image reconstructions were also generated. Multidetector CT imaging of the abdomen and pelvis was performed using the standard protocol following bolus administration of intravenous contrast. CONTRAST:  161mL OMNIPAQUE IOHEXOL 300 MG/ML  SOLN COMPARISON:  MR brain 01/31/2018, CT abdomen pelvis 09/01/2018, cervical spine radiograph 05/09/2013 FINDINGS: CT HEAD FINDINGS Brain: No evidence of acute infarction, hemorrhage, hydrocephalus, extra-axial collection, visible mass lesion or mass effect. Symmetric prominence of the ventricles, cisterns and sulci compatible with parenchymal volume loss. Patchy areas of white matter hypoattenuation are most compatible with chronic microvascular angiopathy. Vascular: Atherosclerotic calcification of the carotid siphons and intradural vertebral arteries. No hyperdense vessel. Skull: No significant scalp swelling. No calvarial fracture or acute osseous injury. Sinuses/Orbits: Minimal thickening in the ethmoids. Paranasal sinuses and mastoid air cells are otherwise predominantly clear. Orbits are unremarkable. Other: None. CT CERVICAL SPINE FINDINGS Alignment: Cervical stabilization collar absent the time of examination. Mild focal reversal the normal cervical lordosis centered at C5-6 as well  as some mild anterolisthesis C3 on 4 of approximately 3 mm,, favored to be on a degenerative basis with cervical spondylitic changes at this level. No evidence of traumatic listhesis. No abnormally widened, perched or jumped facets. Normal alignment of the craniocervical and atlantoaxial articulations accounting for rightward cranial rotation. Skull base and vertebrae: No acute skull base fracture. No vertebral body fracture or height loss. The osseous structures appear diffusely demineralized which may limit detection of small or nondisplaced fractures. Photon starvation artifact seen below C5 may further limit detection of subtle anomaly. Atlantodental arthrosis with calcific pannus formation, nonspecific though can be seen with CPPD or rheumatoid arthropathy. Multilevel spondylitic changes, as detailed below. Soft tissues and spinal canal: No pre or paravertebral fluid or swelling. No visible canal hematoma. Disc levels: Multilevel intervertebral disc height loss with spondylitic endplate changes. Larger disc osteophyte complexes present C4-5, C5-6 with some ligamentum flavum infolding result in at most mild canal stenosis best seen on the orthogonal reconstructions. Additional uncinate spurring and facet hypertrophic changes present throughout the cervical spine resulting in mild-to-moderate neural foraminal narrowing diffusely more moderate to severe C4-C6. Upper chest: No acute abnormality in the upper chest or imaged lung apices. Other: Cervical carotid atherosclerosis. No worrisome thyroid nodules. CT ABDOMEN AND PELVIS FINDINGS Lower chest: Atelectatic changes present in the lung bases. Some additional scarring and/or subsegmental atelectasis seen in the right middle lobe and lingula. 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 Cardiac size is top normal. Three-vessel coronary artery atherosclerosis. No pericardial  effusion. Hepatobiliary: No direct hepatic injury or perihepatic hematoma. No worrisome focal liver lesions. Smooth liver surface contour. Normal hepatic attenuation. Patient is post cholecystectomy. Prominence of the extrahepatic biliary tree with the common bile duct measuring up to 16 mm in diameter which is unchanged from prior and likely related to a combination of senescent change and post  cholecystectomy reservoir effect. Pancreas: No pancreatic contusive change or ductal disruption. Mild pancreatic atrophy. No pancreatic ductal dilatation or surrounding inflammatory changes. Spleen: No acute splenic injury or perisplenic hemorrhage. Geographic region of hyperenhancement seen in the spleen with some increasingly cystic appearance centrally, measuring up to 2.4 cm, previously 1.8 cm on comparison contrast enhanced CT from 2018. No other focal splenic lesion. Normal splenic size. Adrenals/Urinary Tract: Nodular thickening of the adrenal glands is similar to comparisons. Bilateral renal cortical scarring is again noted. Kidneys enhance and excrete symmetrically. No direct renal injury or perinephric hemorrhage. No extravasation of contrast on excretory delayed phase imaging. Large right renal staghorn calculus with some mild urothelial thickening is overall similar to comparison study which appears at most only partially obstructive given lack of calyceal dilatation. No new urolithiasis or frank hydronephrosis is seen. Urinary bladder is unremarkable. Stomach/Bowel: Mild edematous thickening of the distal thoracic esophagus. Prominence of gastric rugal likely related to underdistention. Duodenum is unremarkable with normal sweep across the midline abdomen. No small bowel thickening or dilatation. Cecum displaced to the midline abdomen. 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 No resulting mechanical obstruction  or evidence of vascular compromise of the bowel loop. Only minimal hazy stranding of the herniated fat contents, certainly decreased from comparison. No distal colonic thickening or dilatation. Scattered colonic diverticula without focal inflammation to suggest diverticulitis. Vascular/Lymphatic: Atherosclerotic calcifications within the abdominal aorta and branch vessels. No aneurysm or ectasia. No enlarged abdominopelvic lymph nodes. Reproductive: Normal appearance of the uterus and adnexal structures. Other: Nonspecific lobular low-attenuation collection in the inferior aspect of the right pericolic gutter measuring up to 10 Hounsfield units (2/65 and measuring 2.2 x 2.7 x 2.8 cm in size (2/65, 6/41) is essentially unchanged from comparison in 2018. No abdominopelvic free air or fluid. Mild body wall edema. 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 Tiny fat containing umbilical hernia. No other bowel containing hernias. 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 No acute traumatic osseous abnormality. Bones of the pelvis are intact and congruent. Some remote posterolateral lower thoracic left rib deformities are unchanged from prior. IMPRESSION: CT head: 1. No acute intracranial abnormality. No significant scalp swelling or calvarial fracture. 2. Chronic microvascular ischemic changes, parenchymal volume loss and intracranial atherosclerosis. CT cervical spine: 1. No acute fracture or traumatic listhesis of the cervical spine. 2. Multilevel degenerative changes of the cervical spine as described above. CT abdomen and pelvis: 1. No acute traumatic injury of the abdomen or pelvis. 2. Large right renal staghorn calculus with some mild urothelial thickening is overall similar to comparison study which appears at most only partially  obstructive given lack of calyceal dilatation. Could correlate with features of urinalysis to exclude superimposed urinary tract infection. 3. Geographic region of hyperenhancement in the spleen with some increasingly cystic appearance centrally, measuring up to 2.4 cm in size, previously 1.8 cm on comparison contrast enhanced CT from 2018. Findings are favored to represent a benign process such as a hemangioma or lymphangioma though could be further evaluated with 6 month follow-up MRI given some slow interval change in imaging characteristics this recommendation follows ACR consensus guidelines: White Paper of the ACR Incidental Findings Committee II on Splenic and Nodal Findings. J Am Coll Radiol 2013;10:789-794. 4. Nonspecific lobular low-attenuation  collection in the inferior aspect of the right pericolic gutter measuring up to 2.7 cm in size, essentially unchanged from comparison in comparison in 2018. Appearance and stability favors a benign etiology such as a seroma or lymphocele. 5. 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 No resulting mechanical obstruction or evidence of vascular compromise of the bowel loop. Some mild stranding in the herniated fat contents could reflect residual fat strangulation though is decreased from comparison. 6. Colonic diverticulosis without evidence of diverticulitis. 7. Mild body wall edema. 8. Aortic Atherosclerosis (ICD10-I70.0). Electronically Signed   By: Lovena Le M.D.   On: 07/22/2020 20:01   CT ABDOMEN PELVIS W CONTRAST  Result Date: 07/22/2020 CLINICAL DATA:  Fall with positive head strike on concrete, diffuse pain, on anticoagulation EXAM: CT HEAD WITHOUT CONTRAST CT CERVICAL SPINE WITHOUT CONTRAST CT ABDOMEN AND PELVIS WITH CONTRAST TECHNIQUE: Contiguous axial images were obtained from the base of the skull through the vertex without intravenous contrast. Multidetector CT  imaging of the cervical spine was performed without intravenous contrast. Multiplanar CT image reconstructions were also generated. Multidetector CT imaging of the abdomen and pelvis was performed using the standard protocol following bolus administration of intravenous contrast. CONTRAST:  162mL OMNIPAQUE IOHEXOL 300 MG/ML  SOLN COMPARISON:  MR brain 01/31/2018, CT abdomen pelvis 09/01/2018, cervical spine radiograph 05/09/2013 FINDINGS: CT HEAD FINDINGS Brain: No evidence of acute infarction, hemorrhage, hydrocephalus, extra-axial collection, visible mass lesion or mass effect. Symmetric prominence of the ventricles, cisterns and sulci compatible with parenchymal volume loss. Patchy areas of white matter hypoattenuation are most compatible with chronic microvascular angiopathy. Vascular: Atherosclerotic calcification of the carotid siphons and intradural vertebral arteries. No hyperdense vessel. Skull: No significant scalp swelling. No calvarial fracture or acute osseous injury. Sinuses/Orbits: Minimal thickening in the ethmoids. Paranasal sinuses and mastoid air cells are otherwise predominantly clear. Orbits are unremarkable. Other: None. CT CERVICAL SPINE FINDINGS Alignment: Cervical stabilization collar absent the time of examination. Mild focal reversal the normal cervical lordosis centered at C5-6 as well as some mild anterolisthesis C3 on 4 of approximately 3 mm,, favored to be on a degenerative basis with cervical spondylitic changes at this level. No evidence of traumatic listhesis. No abnormally widened, perched or jumped facets. Normal alignment of the craniocervical and atlantoaxial articulations accounting for rightward cranial rotation. Skull base and vertebrae: No acute skull base fracture. No vertebral body fracture or height loss. The osseous structures appear diffusely demineralized which may limit detection of small or nondisplaced fractures. Photon starvation artifact seen below C5 may further  limit detection of subtle anomaly. Atlantodental arthrosis with calcific pannus formation, nonspecific though can be seen with CPPD or rheumatoid arthropathy. Multilevel spondylitic changes, as detailed below. Soft tissues and spinal canal: No pre or paravertebral fluid or swelling. No visible canal hematoma. Disc levels: Multilevel intervertebral disc height loss with spondylitic endplate changes. Larger disc osteophyte complexes present C4-5, C5-6 with some ligamentum flavum infolding result in at most mild canal stenosis best seen on the orthogonal reconstructions. Additional uncinate spurring and facet hypertrophic changes present throughout the cervical spine resulting in mild-to-moderate neural foraminal narrowing diffusely more moderate to severe C4-C6. Upper chest: No acute abnormality in the upper chest or imaged lung apices. Other: Cervical carotid atherosclerosis. No worrisome thyroid nodules. CT ABDOMEN AND PELVIS FINDINGS Lower chest: Atelectatic changes present in the lung bases. Some additional scarring and/or subsegmental atelectasis seen in the right middle lobe and lingula. 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 Cardiac size is top normal. Three-vessel coronary artery atherosclerosis. No pericardial effusion. Hepatobiliary: No direct hepatic injury or perihepatic hematoma. No worrisome focal liver lesions. Smooth liver surface contour. Normal hepatic attenuation. Patient is post cholecystectomy. Prominence of the extrahepatic biliary tree with the common bile duct measuring up to 16 mm in diameter which is unchanged from prior and likely related to a combination of senescent change and post cholecystectomy reservoir effect. Pancreas: No pancreatic contusive change or ductal disruption. Mild pancreatic atrophy. No pancreatic ductal dilatation or surrounding inflammatory changes. Spleen: No acute splenic  injury or perisplenic hemorrhage. Geographic region of hyperenhancement seen in the spleen with some increasingly cystic appearance centrally, measuring up to 2.4 cm, previously 1.8 cm on comparison contrast enhanced CT from 2018. No other focal splenic lesion. Normal splenic size. Adrenals/Urinary Tract: Nodular thickening of the adrenal glands is similar to comparisons. Bilateral renal cortical scarring is again noted. Kidneys enhance and excrete symmetrically. No direct renal injury or perinephric hemorrhage. No extravasation of contrast on excretory delayed phase imaging. Large right renal staghorn calculus with some mild urothelial thickening is overall similar to comparison study which appears at most only partially obstructive given lack of calyceal dilatation. No new urolithiasis or frank hydronephrosis is seen. Urinary bladder is unremarkable. Stomach/Bowel: Mild edematous thickening of the distal thoracic esophagus. Prominence of gastric rugal likely related to underdistention. Duodenum is unremarkable with normal sweep across the midline abdomen. No small bowel thickening or dilatation. Cecum displaced to the midline abdomen. 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 No resulting mechanical obstruction or evidence of vascular compromise of the bowel loop. Only minimal hazy stranding of the herniated fat contents, certainly decreased from comparison. No distal colonic thickening or dilatation. Scattered colonic diverticula without focal inflammation to suggest diverticulitis. Vascular/Lymphatic: Atherosclerotic calcifications within the abdominal aorta and branch vessels. No aneurysm or ectasia. No enlarged abdominopelvic lymph nodes. Reproductive: Normal appearance of the uterus and adnexal structures. Other: Nonspecific lobular low-attenuation collection in the inferior aspect of the right pericolic gutter  measuring up to 10 Hounsfield units (2/65 and measuring 2.2 x 2.7 x 2.8 cm in size (2/65, 6/41) is essentially unchanged from comparison in 2018. No abdominopelvic free air or fluid. Mild body wall edema. 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 Tiny fat containing umbilical hernia. No other bowel containing hernias. 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 No acute traumatic osseous abnormality. Bones of the pelvis are intact and congruent. Some remote posterolateral lower thoracic left rib deformities are unchanged from prior. IMPRESSION: CT head: 1. No acute intracranial abnormality. No significant scalp swelling or calvarial fracture. 2. Chronic microvascular ischemic changes, parenchymal volume loss and intracranial atherosclerosis. CT cervical spine: 1. No acute fracture or traumatic listhesis of the cervical spine. 2. Multilevel degenerative changes of the cervical spine as described above. CT abdomen and pelvis: 1. No acute traumatic injury of the abdomen or pelvis. 2. Large right renal staghorn calculus with some mild urothelial thickening is overall similar to comparison study which appears at most only partially obstructive given lack of calyceal dilatation. Could correlate with features of urinalysis to exclude superimposed urinary tract infection. 3. Geographic region of  hyperenhancement in the spleen with some increasingly cystic appearance centrally, measuring up to 2.4 cm in size, previously 1.8 cm on comparison contrast enhanced CT from 2018. Findings are favored to represent a benign process such as a hemangioma or lymphangioma though could be further evaluated with 6 month follow-up MRI given some slow interval change in imaging characteristics this recommendation follows ACR consensus guidelines: White Paper of  the ACR Incidental Findings Committee II on Splenic and Nodal Findings. J Am Coll Radiol [phone]. 4. Nonspecific lobular low-attenuation collection in the inferior aspect of the right pericolic gutter measuring up to 2.7 cm in size, essentially unchanged from comparison in comparison in 2018. Appearance and stability favors a benign etiology such as a seroma or lymphocele. 5. 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 No resulting mechanical obstruction or evidence of vascular compromise of the bowel loop. Some mild stranding in the herniated fat contents could reflect residual fat strangulation though is decreased from comparison. 6. Colonic diverticulosis without evidence of diverticulitis. 7. Mild body wall edema. 8. Aortic Atherosclerosis (ICD10-I70.0). Electronically Signed   By: Lovena Le M.D.   On: 07/22/2020 20:01   DG Knee Complete 4 Views Right  Result Date: 07/22/2020 CLINICAL DATA:  Pain status post fall EXAM: RIGHT KNEE - COMPLETE 4+ VIEW COMPARISON:  April 03, 2012 FINDINGS: There is an acute periprosthetic fracture involving the supracondylar distal right femur most notably on the lateral aspect of the femur. There is a large joint effusion. There is an apparent new osteophyte arising from the upper pole of the patella. There is a subtle lucency coursing through this osteophyte suggestive of a nondisplaced fracture. There is soft tissue swelling about the knee. Vascular calcifications are noted. IMPRESSION: 1. Acute periprosthetic fracture involving the supracondylar distal right femur. 2. Lucency coursing through the osteophyte arising from the upper pole of the patella suggestive of a nondisplaced fracture. 3. Large joint effusion. 4. Soft tissue swelling about the knee. 5. Vascular calcifications. Electronically Signed   By: Constance Holster M.D.   On: 07/22/2020 18:42   ECHOCARDIOGRAM  COMPLETE  Result Date: 07/24/2020    ECHOCARDIOGRAM REPORT   Patient Name:   KANI JOBSON Date of Exam: 07/23/2020 Medical Rec #:  [id number]       Height:       62.0 in Accession #:    [id number]      Weight:       147.7 lb Date of Birth:  [birth year]      BSA:          1.681 m Patient Age:    49 years        BP:           149/62 mmHg Patient Gender: F               HR:           59 bpm. Exam Location:  [facility] Procedure: 2D Echo, Cardiac Doppler and Color Doppler Indications:     R01.1 Murmur  History:         Patient has no prior history of Echocardiogram examinations.                  Risk Factors:Hypertension, Dyslipidemia and Prediabetes.                  Scoliosis.  Sonographer:     Wilford Sports Rodgers-Jones Referring Phys:  Nolberto Hanlon  Diagnosing Phys: Bartholome Bill MD IMPRESSIONS  1. Left ventricular ejection fraction, by estimation, is 55 to 60%. Left ventricular ejection fraction by PLAX is 55 %. The left ventricle has normal function. The left ventricle has no regional wall motion abnormalities. Left ventricular diastolic parameters were normal.  2. Right ventricular systolic function is normal. The right ventricular size is normal.  3. Left atrial size was mildly dilated.  4. Right atrial size was mildly dilated.  5. The mitral valve is grossly normal. Mild mitral valve regurgitation.  6. The aortic valve is grossly normal. Aortic valve regurgitation is trivial. FINDINGS  Left Ventricle: Left ventricular ejection fraction, by estimation, is 55 to 60%. Left ventricular ejection fraction by PLAX is 55 %. The left ventricle has normal function. The left ventricle has no regional wall motion abnormalities. The left ventricular internal cavity size was normal in size. There is no left ventricular hypertrophy. Left ventricular diastolic parameters were normal. Right Ventricle: The right ventricular size is normal. No increase in right ventricular wall thickness. Right ventricular systolic function is normal.  Left Atrium: Left atrial size was mildly dilated. Right Atrium: Right atrial size was mildly dilated. Pericardium: There is no evidence of pericardial effusion. Mitral Valve: The mitral valve is grossly normal. Mild mitral valve regurgitation. Tricuspid Valve: The tricuspid valve is grossly normal. Tricuspid valve regurgitation is mild. Aortic Valve: The aortic valve is grossly normal. Aortic valve regurgitation is trivial. Aortic valve mean gradient measures 9.3 mmHg. Aortic valve peak gradient measures 18.2 mmHg. Aortic valve area, by VTI measures 2.07 cm. Pulmonic Valve: The pulmonic valve was not well visualized. Pulmonic valve regurgitation is trivial. Aorta: The aortic root is normal in size and structure. IAS/Shunts: The interatrial septum was not assessed.  LEFT VENTRICLE PLAX 2D LV EF:         Left            Diastology                ventricular     LV e' medial:    6.74 cm/s                ejection        LV E/e' medial:  14.8                fraction by     LV e' lateral:   10.40 cm/s                PLAX is 55      LV E/e' lateral: 9.6                %. LVIDd:         4.54 cm LVIDs:         3.25 cm LV PW:         1.02 cm LV IVS:        1.02 cm LVOT diam:     2.10 cm LV SV:         85 LV SV Index:   50 LVOT Area:     3.46 cm  RIGHT VENTRICLE RV Basal diam:  4.34 cm RV S prime:     16.80 cm/s TAPSE (M-mode): 2.3 cm LEFT ATRIUM             Index       RIGHT ATRIUM           Index LA diam:        5.50 cm  3.27 cm/m  RA Area:     12.20 cm LA Vol (A2C):   74.6 ml 44.39 ml/m RA Volume:   25.80 ml  15.35 ml/m LA Vol (A4C):   48.4 ml 28.80 ml/m LA Biplane Vol: 60.3 ml 35.88 ml/m  AORTIC VALVE AV Area (Vmax):    2.31 cm AV Area (Vmean):   2.21 cm AV Area (VTI):     2.07 cm AV Vmax:           213.33 cm/s AV Vmean:          140.667 cm/s AV VTI:            0.410 m AV Peak Grad:      18.2 mmHg AV Mean Grad:      9.3 mmHg LVOT Vmax:         142.00 cm/s LVOT Vmean:        89.800 cm/s LVOT VTI:          0.245 m  LVOT/AV VTI ratio: 0.60  AORTA Ao Root diam: 3.20 cm MITRAL VALVE               TRICUSPID VALVE MV Area (PHT): 3.17 cm    TR Peak grad:   42.5 mmHg MV Decel Time: 239 msec    TR Vmax:        326.00 cm/s MV E velocity: 99.80 cm/s MV A velocity: 83.90 cm/s  SHUNTS MV E/A ratio:  1.19        Systemic VTI:  0.24 m                            Systemic Diam: 2.10 cm Bartholome Bill MD Electronically signed by Bartholome Bill MD Signature Date/Time: 07/24/2020/7:39:11 AM    Final         Scheduled Meds: . enoxaparin (LOVENOX) injection  40 mg Subcutaneous Q24H  . lidocaine  1 patch Transdermal Q24H  . potassium chloride  40 mEq Oral BID   Continuous Infusions: . sodium chloride 100 mL/hr at 07/23/20 1200  . cefTRIAXone (ROCEPHIN)  IV Stopped (07/23/20 0457)    Assessment & Plan:   Principal Problem:   Fall Active Problems:   Atrial fibrillation ([place])   Essential hypertension   Dementia (HCC)   Hyperlipidemia   Primary osteoarthritis involving multiple joints   Displaced apophyseal fracture of right femur, initial encounter for closed fracture ([place])   #1 status post fall:  Patient with recurrent falls  Likely some gait instability  PT OT recommend SNF    #2  Right periprosthetic fracture of the right knee:  Orthopedics was consulted input was appreciated -after reviewing the x-ray plan is to do nonoperative management given her minimally displaced distal femur fracture and nondisplaced patella fracture -Continue with knee immobilizer, nonweightbearing of the right lower extremity and should avoid knee flexion to avoid placing displacing forces on her patella Must use a walker for assistance with ambulation and must have her knee immobilizer at all times on Needs SNF Needs to follow-up with Dr. Mack Guise in 1 to 2 weeks after discharge  #3 atrial fibrillation:   Rate controlled  On Eliquis at home but due to falls held  Her anticoagulation should be reevaluated as outpatient in the  near future by her cardiologist  Will place her on aspirin for now   #4 dementia:  Stable, continue on Aricept   #5 essential hypertension:  BP was initially low and improved  with IV fluids Continue to monitor   #6 osteoarthritis of multiple joints:  Continue with lidocaine patch   #7 hyperlipidemia: on statin as outpt?  #8-UTI-  UA was positive.  Urine culture pending still Continue on Rocephin  #9-murmur-echo with mild MR  #10-hypokalemia- Was supplemented and stable now. K today is 4.1 Continue to monitor  #11-elevated LFT's- LFT keeps uprising.  Obtained ruq [place]- no acute abn.  Consult GI for further evaluation  Hold statin if indicated    DVT prophylaxis: Lovenox Code Status: Full Family Communication: None at bedside  Status is: Inpatient  Remains inpatient appropriate because:Unsafe d/c plan   Dispo: The patient is from: Home              Anticipated d/c is to: SNF              Anticipated d/c date is: 2 days              Patient currently is medically stable. SNF pending            LOS: 2 days   Time spent: 45 min with >50% on coc    Nolberto Hanlon, MD [facility] Pager [phone]  If 7PM-7AM, please contact night-coverage [email or web address] Password [facility] 07/24/2020, 7:56 AM

## 2020-07-24 NOTE — TOC Progression Note (Signed)
Transition of Care [facility]) - Progression Note    Patient Details  Name: Regina Mathis MRN: [id number] Date of Birth: [birth year]  [facility] CM/SW [place], RN Phone Number: 07/24/2020, 10:52 AM  Clinical Narrative:   RNCM met with patient at bedside, patient understands that it will be necessary for her to go to SNF at discharge but verbalizes she is unsure of how she will pay for it. Spent time with patient providing information about how SNF is covered. Patient is agreeable to bed search and just requests that it be a close to [place] as possible.  RNCM verified PASSR, completed FL-2 and started bed search.     Expected Discharge Plan: [place] Barriers to Discharge: Continued Medical Work up  Expected Discharge Plan and Services Expected Discharge Plan: [place]   Discharge Planning Services: CM Consult Post Acute Care Choice: [place] Living arrangements for the past 2 months: Single Family Home                                       Social Determinants of Health (SDOH) Interventions    Readmission Risk Interventions No flowsheet data found.

## 2020-07-24 NOTE — Consult Note (Addendum)
Cephas Darby, MD [address]  [place]  [place], [place] [address]  Main: [phone]  Fax: [phone] Pager: [phone]   Consultation  Referring Provider:     No ref. provider found Primary Care Physician:  Crissie Figures, PA-C Primary Gastroenterologist:  Dr. Vicente Males         Reason for Consultation:     Elevated LFTs  Date of Admission:  07/22/2020 Date of Consultation:  07/24/2020         HPI:   Regina Mathis is a 79 y.o. female past medical history as listed below, history of right total knee arthroplasty in 03/2012, admitted after a fall at her home.  Patient presented on 10/26.  Patient is evaluated by orthopedics who recommended nonsurgical management at this time.  Patient is suspected to have UTI based on abnormal UA and started on ceftriaxone on 10/27.  Patient is noticed to have elevated LFTs since yesterday AST 140, ALT 53, normal TPMT bili.  Her LFTs were normal on admission 10/26.  Further increased to AST 228, ALT 275, AP and T bili normal.  Therefore GI is consulted for further evaluation. Last normal LFTs in 07/2016.  She had history of cholecystectomy, dilated CBD with no evidence of obstruction thought to be physiological after cholecystectomy. Patient underwent right upper carotid ultrasound during this admission which was unremarkable except for CBD measuring 15 mm.  Dopplers revealed patent portal vein. Patient denies any rash, right upper quadrant discomfort, fever, chills, nausea or vomiting She is comfortable, sitting in chair when I saw her.  She reports occasional alcohol use only.  Does not smoke She denies recent blood transfusions, denies intake of herbal supplements  NSAIDs: None  Antiplts/Anticoagulants/Anti thrombotics: Apixaban for history of A. fib  GI Procedures: None  Past Medical History:  Diagnosis Date  . GERD (gastroesophageal reflux disease)   . Hyperlipidemia   . Hypertension   . Osteoarthritis   . Pre-diabetes   .  Scoliosis deformity of spine   . Valvular heart disease    Echocardiogram 2014- moderate mitral regurg, moderate tricuspid regurg, moderate dilated left atrium and right atrium, EF 50-55%    Past Surgical History:  Procedure Laterality Date  . CATARACT EXTRACTION W/ INTRAOCULAR LENS  IMPLANT, BILATERAL    . CHOLECYSTECTOMY    . COLONOSCOPY WITH PROPOFOL N/A 01/16/2016   Procedure: COLONOSCOPY WITH PROPOFOL;  Surgeon: [name], MD;  Location: [facility] ENDOSCOPY;  Service: Endoscopy;  Laterality: N/A;  . HERNIA REPAIR     with Bowel Involvement (04/2012)  . HYSTEROSCOPY WITH D & C N/A 01/14/2017   Procedure: DILATATION AND CURETTAGE /HYSTEROSCOPY;  Surgeon: Honor Loh Ward, MD;  Location: [facility] ORS;  Service: Gynecology;  Laterality: N/A;  . 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 Lumpectomy Chest Area    . TOTAL KNEE ARTHROPLASTY Bilateral     Prior to Admission medications   Medication Sig Start Date End Date Taking? Authorizing Provider  apixaban (ELIQUIS) 5 MG TABS tablet Take 5 mg by mouth 2 (two) times daily.  05/08/20  Yes [provider]  ascorbic acid (VITAMIN C) 500 MG tablet Take 1,000 mg by mouth daily.   Yes [provider]  cholecalciferol (VITAMIN D3) 25 MCG (1000 UNIT) tablet Take 2,000 Units by mouth daily.   Yes [provider]  citalopram (CELEXA) 10 MG tablet Take 10 mg by mouth daily.   Yes [provider]  donepezil (ARICEPT) 10 MG tablet Take 10 mg by mouth at bedtime.   Yes [provider]  furosemide (LASIX) 20 MG tablet Take 20 mg by mouth.   Yes [provider]  lisinopril-hydrochlorothiazide (ZESTORETIC) 20-25 MG tablet Take 1 tablet by mouth daily.   Yes [provider]  potassium chloride (KLOR-CON) 10 MEQ tablet Take 10 mEq by mouth daily.   Yes [provider]  simvastatin (ZOCOR) 40 MG tablet Take 40 mg by mouth daily.   Yes [provider]  tiZANidine (ZANAFLEX) 2 MG tablet Take 2 mg by mouth every 6 (six) hours as needed for muscle spasms.   Yes [provider]   Current Facility-Administered Medications:  .  0.45 % sodium chloride infusion, , Intravenous, Continuous, Amery, Sahar, MD, Last Rate: 50 mL/hr at 07/24/20 1509, Rate Change at 07/24/20 1509 .  acetaminophen (TYLENOL) tablet 650 mg, 650 mg, Oral, Q6H PRN, 650 mg at 07/23/20 1203 **OR** acetaminophen (TYLENOL) suppository 650 mg, 650 mg, Rectal, Q6H PRN, Jonelle Sidle, Mohammad L, MD .  aspirin EC tablet 81 mg, 81 mg, Oral, Daily, Kurtis Bushman, Sahar, MD, 81 mg at 07/24/20 1509 .  enoxaparin (LOVENOX) injection 40 mg, 40 mg, Subcutaneous, Q24H, Garba, Mohammad L, MD, 40 mg at 07/23/20 2321 .  lidocaine (LIDODERM) 5 % 1 patch, 1 patch, Transdermal, Q24H, Nolberto Hanlon, MD, 1 patch at 07/24/20 1318 .  morphine 2 MG/ML injection 2 mg, 2 mg, Intravenous, Q2H PRN, Elwyn Reach, MD, 2 mg at 07/23/20 0736 .  ondansetron (ZOFRAN) tablet 4 mg, 4 mg, Oral, Q6H PRN **OR** ondansetron (ZOFRAN) injection 4 mg, 4 mg, Intravenous, Q6H PRN, Elwyn Reach, MD, 4 mg at 07/23/20 0539   Family History  Problem Relation Age of Onset  . CAD Mother   . Diabetes Mellitus II Mother   . Stroke Mother   . Lung cancer Father   . Diabetes Mellitus II Sister   . Diabetes Mellitus II Brother   . Breast cancer Neg Hx   . Bladder Cancer Neg Hx   . Kidney cancer Neg Hx      Social History   Tobacco Use  . Smoking status: Never Smoker  . Smokeless tobacco: Never Used  Vaping Use  . Vaping Use: Never used  Substance Use Topics  . Alcohol use: No    Alcohol/week: 0.0 standard drinks  . Drug use: No    Allergies as of 07/22/2020 - Review Complete 07/22/2020  Allergen Reaction Noted  . Other      Review of Systems:    All systems reviewed and negative except where noted in HPI.   Physical Exam:  Vital signs in last 24 hours: Temp:  [97.6 F (36.4 C)-98.4 F (36.9 C)] 98.3 F  (36.8 C) (10/28 1618) Pulse Rate:  [53-57] 54 (10/28 1618) Resp:  [15-18] 18 (10/28 1618) BP: (130-167)/(53-75) 130/60 (10/28 1618) SpO2:  [93 %-99 %] 97 % (10/28 1618)   General:   Pleasant, cooperative in NAD Head:  Normocephalic and atraumatic. Eyes:   No icterus.   Conjunctiva pink. PERRLA. Ears:  Normal auditory acuity. Neck:  Supple; no masses or thyroidomegaly Lungs: Respirations even and unlabored. Lungs clear to auscultation bilaterally.   No wheezes, crackles, or rhonchi.  Heart:  Regular rate and rhythm;  Without murmur, clicks, rubs or gallops Abdomen:  Soft, nondistended, nontender. Normal bowel sounds. No appreciable masses or hepatomegaly.  No rebound or guarding.  Rectal:  Not performed. Msk: Right  leg, decreased mobility secondary to fracture Extremities:  Without edema, cyanosis or clubbing. Neurologic:  Alert and oriented x3;  grossly normal neurologically. Skin:  Intact without significant lesions or rashes. Psych:  Alert and cooperative. Normal affect.  LAB RESULTS: CBC Latest Ref Rng & Units 07/23/2020 07/22/2020 12/11/2018  WBC 4.0 - 10.5 K/uL 4.4 9.8 4.8  Hemoglobin 12.0 - 15.0 g/dL 11.0(L) 12.1 12.1  Hematocrit 36 - 46 % 33.1(L) 37.4 35.9  Platelets 150 - 400 K/uL 176 207 224    BMET BMP Latest Ref Rng & Units 07/24/2020 07/23/2020 07/22/2020  Glucose 70 - 99 mg/dL 102(H) 119(H) 121(H)  BUN 8 - 23 mg/dL 19 25(H) 29(H)  Creatinine 0.44 - 1.00 mg/dL 0.66 0.72 0.99  BUN/Creat Ratio 12 - 28 - - -  Sodium 135 - 145 mmol/L 137 140 140  Potassium 3.5 - 5.1 mmol/L 4.1 3.0(L) 4.2  Chloride 98 - 111 mmol/L 102 102 102  CO2 22 - 32 mmol/L '27 29 29  ' Calcium 8.9 - 10.3 mg/dL 8.5(L) 8.6(L) 9.2    LFT Hepatic Function Latest Ref Rng & Units 07/24/2020 07/23/2020 07/22/2020  Total Protein 6.5 - 8.1 g/dL 5.9(L) 6.3(L) 7.3  Albumin 3.5 - 5.0 g/dL 3.3(L) 3.7 4.2  AST 15 - 41 U/L 228(H) 140(H) 20  ALT 0 - 44 U/L 275(H) 53(H) 14  Alk Phosphatase 38 - 126 U/L 97 68  62  Total Bilirubin 0.3 - 1.2 mg/dL 0.9 1.0 0.6     STUDIES: DG Wrist Complete Left  Result Date: 07/22/2020 CLINICAL DATA:  Pain EXAM: LEFT WRIST - COMPLETE 3+ VIEW COMPARISON:  None. FINDINGS: There is no evidence of fracture or dislocation. There is no evidence of arthropathy or other focal bone abnormality. Soft tissues are unremarkable. IMPRESSION: Negative. Electronically Signed   By: Constance Holster M.D.   On: 07/22/2020 18:43   CT Head Wo Contrast  Result Date: 07/22/2020 CLINICAL DATA:  Fall with positive head strike on concrete, diffuse pain, on anticoagulation EXAM: CT HEAD WITHOUT CONTRAST CT CERVICAL SPINE WITHOUT CONTRAST CT ABDOMEN AND PELVIS WITH CONTRAST TECHNIQUE: Contiguous axial images were obtained from the base of the skull through the vertex without intravenous contrast. Multidetector CT imaging of the cervical spine was performed without intravenous contrast. Multiplanar CT image reconstructions were also generated. Multidetector CT imaging of the abdomen and pelvis was performed using the standard protocol following bolus administration of intravenous contrast. CONTRAST:  165m OMNIPAQUE IOHEXOL 300 MG/ML  SOLN COMPARISON:  MR brain 01/31/2018, CT abdomen pelvis 09/01/2018, cervical spine radiograph 05/09/2013 FINDINGS: CT HEAD FINDINGS Brain: No evidence of acute infarction, hemorrhage, hydrocephalus, extra-axial collection, visible mass lesion or mass effect. Symmetric prominence of the ventricles, cisterns and sulci compatible with parenchymal volume loss. Patchy areas of white matter hypoattenuation are most compatible with chronic microvascular angiopathy. Vascular: Atherosclerotic calcification of the carotid siphons and intradural vertebral arteries. No hyperdense vessel. Skull: No significant scalp swelling. No calvarial fracture or acute osseous injury. Sinuses/Orbits: Minimal thickening in the ethmoids. Paranasal sinuses and mastoid air cells are otherwise  predominantly clear. Orbits are unremarkable. Other: None. CT CERVICAL SPINE FINDINGS Alignment: Cervical stabilization collar absent the time of examination. Mild focal reversal the normal cervical lordosis centered at C5-6 as well as some mild anterolisthesis C3 on 4 of approximately 3 mm,, favored to be on a degenerative basis with cervical spondylitic changes at this level. No evidence of traumatic listhesis. No abnormally widened, perched or jumped facets. Normal alignment of the craniocervical and  atlantoaxial articulations accounting for rightward cranial rotation. Skull base and vertebrae: No acute skull base fracture. No vertebral body fracture or height loss. The osseous structures appear diffusely demineralized which may limit detection of small or nondisplaced fractures. Photon starvation artifact seen below C5 may further limit detection of subtle anomaly. Atlantodental arthrosis with calcific pannus formation, nonspecific though can be seen with CPPD or rheumatoid arthropathy. Multilevel spondylitic changes, as detailed below. Soft tissues and spinal canal: No pre or paravertebral fluid or swelling. No visible canal hematoma. Disc levels: Multilevel intervertebral disc height loss with spondylitic endplate changes. Larger disc osteophyte complexes present C4-5, C5-6 with some ligamentum flavum infolding result in at most mild canal stenosis best seen on the orthogonal reconstructions. Additional uncinate spurring and facet hypertrophic changes present throughout the cervical spine resulting in mild-to-moderate neural foraminal narrowing diffusely more moderate to severe C4-C6. Upper chest: No acute abnormality in the upper chest or imaged lung apices. Other: Cervical carotid atherosclerosis. No worrisome thyroid nodules. CT ABDOMEN AND PELVIS FINDINGS Lower chest: Atelectatic changes present in the lung bases. Some additional scarring and/or subsegmental atelectasis seen in the right middle lobe and  lingula. 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 Cardiac size is top normal. Three-vessel coronary artery atherosclerosis. No pericardial effusion. Hepatobiliary: No direct hepatic injury or perihepatic hematoma. No worrisome focal liver lesions. Smooth liver surface contour. Normal hepatic attenuation. Patient is post cholecystectomy. Prominence of the extrahepatic biliary tree with the common bile duct measuring up to 16 mm in diameter which is unchanged from prior and likely related to a combination of senescent change and post cholecystectomy reservoir effect. Pancreas: No pancreatic contusive change or ductal disruption. Mild pancreatic atrophy. No pancreatic ductal dilatation or surrounding inflammatory changes. Spleen: No acute splenic injury or perisplenic hemorrhage. Geographic region of hyperenhancement seen in the spleen with some increasingly cystic appearance centrally, measuring up to 2.4 cm, previously 1.8 cm on comparison contrast enhanced CT from 2018. No other focal splenic lesion. Normal splenic size. Adrenals/Urinary Tract: Nodular thickening of the adrenal glands is similar to comparisons. Bilateral renal cortical scarring is again noted. Kidneys enhance and excrete symmetrically. No direct renal injury or perinephric hemorrhage. No extravasation of contrast on excretory delayed phase imaging. Large right renal staghorn calculus with some mild urothelial thickening is overall similar to comparison study which appears at most only partially obstructive given lack of calyceal dilatation. No new urolithiasis or frank hydronephrosis is seen. Urinary bladder is unremarkable. Stomach/Bowel: Mild edematous thickening of the distal thoracic esophagus. Prominence of gastric rugal likely related to underdistention. Duodenum is unremarkable with normal sweep across the midline abdomen. No small bowel thickening or  dilatation. Cecum displaced to the midline abdomen. 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 No resulting mechanical obstruction or evidence of vascular compromise of the bowel loop. Only minimal hazy stranding of the herniated fat contents, certainly decreased from comparison. No distal colonic thickening or dilatation. Scattered colonic diverticula without focal inflammation to suggest diverticulitis. Vascular/Lymphatic: Atherosclerotic calcifications within the abdominal aorta and branch vessels. No aneurysm or ectasia. No enlarged abdominopelvic lymph nodes. Reproductive: Normal appearance of the uterus and adnexal structures. Other: Nonspecific lobular low-attenuation collection in the inferior aspect of the right pericolic gutter measuring up to 10 Hounsfield units (2/65 and measuring 2.2 x 2.7 x 2.8 cm in size (2/65, 6/41) is essentially  unchanged from comparison in 2018. No abdominopelvic free air or fluid. Mild body wall edema. 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 Tiny fat containing umbilical hernia. No other bowel containing hernias. 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 No acute traumatic osseous abnormality. Bones of the pelvis are intact and congruent. Some remote posterolateral lower thoracic left rib deformities are unchanged from prior. IMPRESSION: CT head: 1. No acute intracranial abnormality. No significant scalp swelling or calvarial fracture. 2. Chronic microvascular ischemic changes, parenchymal volume loss and intracranial atherosclerosis. CT cervical spine: 1. No acute fracture or traumatic listhesis of the cervical spine. 2. Multilevel degenerative changes of the cervical spine as described above. CT  abdomen and pelvis: 1. No acute traumatic injury of the abdomen or pelvis. 2. Large right renal staghorn calculus with some mild urothelial thickening is overall similar to comparison study which appears at most only partially obstructive given lack of calyceal dilatation. Could correlate with features of urinalysis to exclude superimposed urinary tract infection. 3. Geographic region of hyperenhancement in the spleen with some increasingly cystic appearance centrally, measuring up to 2.4 cm in size, previously 1.8 cm on comparison contrast enhanced CT from 2018. Findings are favored to represent a benign process such as a hemangioma or lymphangioma though could be further evaluated with 6 month follow-up MRI given some slow interval change in imaging characteristics this recommendation follows ACR consensus guidelines: White Paper of the ACR Incidental Findings Committee II on Splenic and Nodal Findings. J Am Coll Radiol [phone]. 4. Nonspecific lobular low-attenuation collection in the inferior aspect of the right pericolic gutter measuring up to 2.7 cm in size, essentially unchanged from comparison in comparison in 2018. Appearance and stability favors a benign etiology such as a seroma or lymphocele. 5. 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 No resulting mechanical obstruction or evidence of vascular compromise of the bowel loop. Some mild stranding in the herniated fat contents could reflect residual fat strangulation though is decreased from comparison. 6. Colonic diverticulosis without evidence of diverticulitis. 7. Mild body wall edema. 8. Aortic Atherosclerosis (ICD10-I70.0). Electronically Signed   By: Lovena Le M.D.   On: 07/22/2020 20:01   CT Cervical Spine Wo Contrast  Result Date: 07/22/2020 CLINICAL DATA:  Fall with positive head strike on concrete, diffuse pain, on anticoagulation EXAM: CT HEAD WITHOUT  CONTRAST CT CERVICAL SPINE WITHOUT CONTRAST CT ABDOMEN AND PELVIS WITH CONTRAST TECHNIQUE: Contiguous axial images were obtained from the base of the skull through the vertex without intravenous contrast. Multidetector CT imaging of the cervical spine was performed without intravenous contrast. Multiplanar CT image reconstructions were also generated. Multidetector CT imaging of the abdomen and pelvis was performed using the standard protocol following bolus administration of intravenous contrast. CONTRAST:  126m OMNIPAQUE IOHEXOL 300 MG/ML  SOLN COMPARISON:  MR brain 01/31/2018, CT abdomen pelvis 09/01/2018, cervical spine radiograph 05/09/2013 FINDINGS: CT HEAD FINDINGS Brain: No evidence of acute infarction, hemorrhage, hydrocephalus, extra-axial collection, visible mass lesion or mass effect. Symmetric prominence of the ventricles, cisterns and sulci compatible with parenchymal volume loss. Patchy areas of white matter hypoattenuation are most compatible with chronic microvascular angiopathy. Vascular: Atherosclerotic calcification of the carotid siphons and intradural vertebral arteries. No hyperdense vessel. Skull: No significant scalp swelling. No calvarial fracture or acute osseous injury. Sinuses/Orbits:  Minimal thickening in the ethmoids. Paranasal sinuses and mastoid air cells are otherwise predominantly clear. Orbits are unremarkable. Other: None. CT CERVICAL SPINE FINDINGS Alignment: Cervical stabilization collar absent the time of examination. Mild focal reversal the normal cervical lordosis centered at C5-6 as well as some mild anterolisthesis C3 on 4 of approximately 3 mm,, favored to be on a degenerative basis with cervical spondylitic changes at this level. No evidence of traumatic listhesis. No abnormally widened, perched or jumped facets. Normal alignment of the craniocervical and atlantoaxial articulations accounting for rightward cranial rotation. Skull base and vertebrae: No acute skull base  fracture. No vertebral body fracture or height loss. The osseous structures appear diffusely demineralized which may limit detection of small or nondisplaced fractures. Photon starvation artifact seen below C5 may further limit detection of subtle anomaly. Atlantodental arthrosis with calcific pannus formation, nonspecific though can be seen with CPPD or rheumatoid arthropathy. Multilevel spondylitic changes, as detailed below. Soft tissues and spinal canal: No pre or paravertebral fluid or swelling. No visible canal hematoma. Disc levels: Multilevel intervertebral disc height loss with spondylitic endplate changes. Larger disc osteophyte complexes present C4-5, C5-6 with some ligamentum flavum infolding result in at most mild canal stenosis best seen on the orthogonal reconstructions. Additional uncinate spurring and facet hypertrophic changes present throughout the cervical spine resulting in mild-to-moderate neural foraminal narrowing diffusely more moderate to severe C4-C6. Upper chest: No acute abnormality in the upper chest or imaged lung apices. Other: Cervical carotid atherosclerosis. No worrisome thyroid nodules. CT ABDOMEN AND PELVIS FINDINGS Lower chest: Atelectatic changes present in the lung bases. Some additional scarring and/or subsegmental atelectasis seen in the right middle lobe and lingula. 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 Cardiac size is top normal. Three-vessel coronary artery atherosclerosis. No pericardial effusion. Hepatobiliary: No direct hepatic injury or perihepatic hematoma. No worrisome focal liver lesions. Smooth liver surface contour. Normal hepatic attenuation. Patient is post cholecystectomy. Prominence of the extrahepatic biliary tree with the common bile duct measuring up to 16 mm in diameter which is unchanged from prior and likely related to a combination of senescent change and  post cholecystectomy reservoir effect. Pancreas: No pancreatic contusive change or ductal disruption. Mild pancreatic atrophy. No pancreatic ductal dilatation or surrounding inflammatory changes. Spleen: No acute splenic injury or perisplenic hemorrhage. Geographic region of hyperenhancement seen in the spleen with some increasingly cystic appearance centrally, measuring up to 2.4 cm, previously 1.8 cm on comparison contrast enhanced CT from 2018. No other focal splenic lesion. Normal splenic size. Adrenals/Urinary Tract: Nodular thickening of the adrenal glands is similar to comparisons. Bilateral renal cortical scarring is again noted. Kidneys enhance and excrete symmetrically. No direct renal injury or perinephric hemorrhage. No extravasation of contrast on excretory delayed phase imaging. Large right renal staghorn calculus with some mild urothelial thickening is overall similar to comparison study which appears at most only partially obstructive given lack of calyceal dilatation. No new urolithiasis or frank hydronephrosis is seen. Urinary bladder is unremarkable. Stomach/Bowel: Mild edematous thickening of the distal thoracic esophagus. Prominence of gastric rugal likely related to underdistention. Duodenum is unremarkable with normal sweep across the midline abdomen. No small bowel thickening or dilatation. Cecum displaced to the midline abdomen. 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 No resulting mechanical obstruction or evidence of vascular compromise of the bowel loop. Only minimal hazy  stranding of the herniated fat contents, certainly decreased from comparison. No distal colonic thickening or dilatation. Scattered colonic diverticula without focal inflammation to suggest diverticulitis. Vascular/Lymphatic: Atherosclerotic calcifications within the abdominal aorta and branch vessels. No aneurysm or  ectasia. No enlarged abdominopelvic lymph nodes. Reproductive: Normal appearance of the uterus and adnexal structures. Other: Nonspecific lobular low-attenuation collection in the inferior aspect of the right pericolic gutter measuring up to 10 Hounsfield units (2/65 and measuring 2.2 x 2.7 x 2.8 cm in size (2/65, 6/41) is essentially unchanged from comparison in 2018. No abdominopelvic free air or fluid. Mild body wall edema. 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 Tiny fat containing umbilical hernia. No other bowel containing hernias. 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 No acute traumatic osseous abnormality. Bones of the pelvis are intact and congruent. Some remote posterolateral lower thoracic left rib deformities are unchanged from prior. IMPRESSION: CT head: 1. No acute intracranial abnormality. No significant scalp swelling or calvarial fracture. 2. Chronic microvascular ischemic changes, parenchymal volume loss and intracranial atherosclerosis. CT cervical spine: 1. No acute fracture or traumatic listhesis of the cervical spine. 2. Multilevel degenerative changes of the cervical spine as described above. CT abdomen and pelvis: 1. No acute traumatic injury of the abdomen or pelvis. 2. Large right renal staghorn calculus with some mild urothelial thickening is overall similar to comparison study which appears at most only partially obstructive given lack of calyceal dilatation. Could correlate with features of urinalysis to exclude superimposed urinary tract infection. 3. Geographic region of hyperenhancement in the spleen with some increasingly cystic appearance centrally, measuring up to 2.4 cm in size, previously 1.8 cm on comparison contrast enhanced CT from 2018. Findings are favored to represent a benign process  such as a hemangioma or lymphangioma though could be further evaluated with 6 month follow-up MRI given some slow interval change in imaging characteristics this recommendation follows ACR consensus guidelines: White Paper of the ACR Incidental Findings Committee II on Splenic and Nodal Findings. J Am Coll Radiol [id number]. 4. Nonspecific lobular low-attenuation collection in the inferior aspect of the right pericolic gutter measuring up to 2.7 cm in size, essentially unchanged from comparison in comparison in 2018. Appearance and stability favors a benign etiology such as a seroma or lymphocele. 5. 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 No resulting mechanical obstruction or evidence of vascular compromise of the bowel loop. Some mild stranding in the herniated fat contents could reflect residual fat strangulation though is decreased from comparison. 6. Colonic diverticulosis without evidence of diverticulitis. 7. Mild body wall edema. 8. Aortic Atherosclerosis (ICD10-I70.0). Electronically Signed   By: Lovena Le M.D.   On: 07/22/2020 20:01   CT ABDOMEN PELVIS W CONTRAST  Result Date: 07/22/2020 CLINICAL DATA:  Fall with positive head strike on concrete, diffuse pain, on anticoagulation EXAM: CT HEAD WITHOUT CONTRAST CT CERVICAL SPINE WITHOUT CONTRAST CT ABDOMEN AND PELVIS WITH CONTRAST TECHNIQUE: Contiguous axial images were obtained from the base of the skull through the vertex without intravenous contrast. Multidetector CT imaging of the cervical spine was performed without intravenous contrast. Multiplanar CT image reconstructions were also generated. Multidetector CT imaging of the abdomen and pelvis was performed using the standard protocol following bolus administration of intravenous contrast. CONTRAST:  131m OMNIPAQUE IOHEXOL 300 MG/ML  SOLN COMPARISON:  MR brain 01/31/2018, CT abdomen pelvis 09/01/2018,  cervical spine radiograph 05/09/2013 FINDINGS: CT HEAD FINDINGS Brain: No evidence of acute infarction, hemorrhage, hydrocephalus, extra-axial collection, visible mass lesion or mass effect. Symmetric prominence of the ventricles, cisterns and sulci compatible with parenchymal volume loss. Patchy areas of white matter hypoattenuation are most compatible with chronic microvascular angiopathy. Vascular: Atherosclerotic calcification of the carotid siphons and intradural vertebral arteries. No hyperdense vessel. Skull: No significant scalp swelling. No calvarial fracture or acute osseous injury. Sinuses/Orbits: Minimal thickening in the ethmoids. Paranasal sinuses and mastoid air cells are otherwise predominantly clear. Orbits are unremarkable. Other: None. CT CERVICAL SPINE FINDINGS Alignment: Cervical stabilization collar absent the time of examination. Mild focal reversal the normal cervical lordosis centered at C5-6 as well as some mild anterolisthesis C3 on 4 of approximately 3 mm,, favored to be on a degenerative basis with cervical spondylitic changes at this level. No evidence of traumatic listhesis. No abnormally widened, perched or jumped facets. Normal alignment of the craniocervical and atlantoaxial articulations accounting for rightward cranial rotation. Skull base and vertebrae: No acute skull base fracture. No vertebral body fracture or height loss. The osseous structures appear diffusely demineralized which may limit detection of small or nondisplaced fractures. Photon starvation artifact seen below C5 may further limit detection of subtle anomaly. Atlantodental arthrosis with calcific pannus formation, nonspecific though can be seen with CPPD or rheumatoid arthropathy. Multilevel spondylitic changes, as detailed below. Soft tissues and spinal canal: No pre or paravertebral fluid or swelling. No visible canal hematoma. Disc levels: Multilevel intervertebral disc height loss with spondylitic endplate  changes. Larger disc osteophyte complexes present C4-5, C5-6 with some ligamentum flavum infolding result in at most mild canal stenosis best seen on the orthogonal reconstructions. Additional uncinate spurring and facet hypertrophic changes present throughout the cervical spine resulting in mild-to-moderate neural foraminal narrowing diffusely more moderate to severe C4-C6. Upper chest: No acute abnormality in the upper chest or imaged lung apices. Other: Cervical carotid atherosclerosis. No worrisome thyroid nodules. CT ABDOMEN AND PELVIS FINDINGS Lower chest: Atelectatic changes present in the lung bases. Some additional scarring and/or subsegmental atelectasis seen in the right middle lobe and lingula. 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 Cardiac size is top normal. Three-vessel coronary artery atherosclerosis. No pericardial effusion. Hepatobiliary: No direct hepatic injury or perihepatic hematoma. No worrisome focal liver lesions. Smooth liver surface contour. Normal hepatic attenuation. Patient is post cholecystectomy. Prominence of the extrahepatic biliary tree with the common bile duct measuring up to 16 mm in diameter which is unchanged from prior and likely related to a combination of senescent change and post cholecystectomy reservoir effect. Pancreas: No pancreatic contusive change or ductal disruption. Mild pancreatic atrophy. No pancreatic ductal dilatation or surrounding inflammatory changes. Spleen: No acute splenic injury or perisplenic hemorrhage. Geographic region of hyperenhancement seen in the spleen with some increasingly cystic appearance centrally, measuring up to 2.4 cm, previously 1.8 cm on comparison contrast enhanced CT from 2018. No other focal splenic lesion. Normal splenic size. Adrenals/Urinary Tract: Nodular thickening of the adrenal glands is similar to comparisons. Bilateral renal  cortical scarring is again noted. Kidneys enhance and excrete symmetrically. No direct renal injury or perinephric hemorrhage. No extravasation of contrast on excretory delayed phase imaging. Large right renal staghorn calculus with some mild urothelial thickening is overall similar to comparison study which appears at most only partially obstructive given lack of calyceal dilatation.  No new urolithiasis or frank hydronephrosis is seen. Urinary bladder is unremarkable. Stomach/Bowel: Mild edematous thickening of the distal thoracic esophagus. Prominence of gastric rugal likely related to underdistention. Duodenum is unremarkable with normal sweep across the midline abdomen. No small bowel thickening or dilatation. Cecum displaced to the midline abdomen. 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 No resulting mechanical obstruction or evidence of vascular compromise of the bowel loop. Only minimal hazy stranding of the herniated fat contents, certainly decreased from comparison. No distal colonic thickening or dilatation. Scattered colonic diverticula without focal inflammation to suggest diverticulitis. Vascular/Lymphatic: Atherosclerotic calcifications within the abdominal aorta and branch vessels. No aneurysm or ectasia. No enlarged abdominopelvic lymph nodes. Reproductive: Normal appearance of the uterus and adnexal structures. Other: Nonspecific lobular low-attenuation collection in the inferior aspect of the right pericolic gutter measuring up to 10 Hounsfield units (2/65 and measuring 2.2 x 2.7 x 2.8 cm in size (2/65, 6/41) is essentially unchanged from comparison in 2018. No abdominopelvic free air or fluid. Mild body wall edema. 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 Tiny fat containing umbilical hernia. No other bowel containing hernias. Musculoskeletal: Severe dextrocurvature of the lumbar spine  with an apex at L2 and stable severe multilevel discogenic and facet degenerative change with lateral compression deformities of the spine, as seen previously. No acute traumatic osseous abnormality. Bones of the pelvis are intact and congruent. Some remote posterolateral lower thoracic left rib deformities are unchanged from prior. IMPRESSION: CT head: 1. No acute intracranial abnormality. No significant scalp swelling or calvarial fracture. 2. Chronic microvascular ischemic changes, parenchymal volume loss and intracranial atherosclerosis. CT cervical spine: 1. No acute fracture or traumatic listhesis of the cervical spine. 2. Multilevel degenerative changes of the cervical spine as described above. CT abdomen and pelvis: 1. No acute traumatic injury of the abdomen or pelvis. 2. Large right renal staghorn calculus with some mild urothelial thickening is overall similar to comparison study which appears at most only partially obstructive given lack of calyceal dilatation. Could correlate with features of urinalysis to exclude superimposed urinary tract infection. 3. Geographic region of hyperenhancement in the spleen with some increasingly cystic appearance centrally, measuring up to 2.4 cm in size, previously 1.8 cm on comparison contrast enhanced CT from 2018. Findings are favored to represent a benign process such as a hemangioma or lymphangioma though could be further evaluated with 6 month follow-up MRI given some slow interval change in imaging characteristics this recommendation follows ACR consensus guidelines: White Paper of the ACR Incidental Findings Committee II on Splenic and Nodal Findings. J Am Coll Radiol [phone]. 4. Nonspecific lobular low-attenuation collection in the inferior aspect of the right pericolic gutter measuring up to 2.7 cm in size, essentially unchanged from comparison in comparison in 2018. Appearance and stability favors a benign etiology such as a seroma or lymphocele. 5.  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 No resulting mechanical obstruction or evidence of vascular compromise of the bowel loop. Some mild stranding in the herniated fat contents could reflect residual fat strangulation though is decreased from comparison. 6. Colonic diverticulosis without evidence of diverticulitis. 7. Mild body wall edema. 8. Aortic Atherosclerosis (ICD10-I70.0). Electronically Signed   By: Lovena Le M.D.   On: 07/22/2020 20:01   DG Knee Complete 4 Views Right  Result Date: 07/22/2020 CLINICAL DATA:  Pain status  post fall EXAM: RIGHT KNEE - COMPLETE 4+ VIEW COMPARISON:  April 03, 2012 FINDINGS: There is an acute periprosthetic fracture involving the supracondylar distal right femur most notably on the lateral aspect of the femur. There is a large joint effusion. There is an apparent new osteophyte arising from the upper pole of the patella. There is a subtle lucency coursing through this osteophyte suggestive of a nondisplaced fracture. There is soft tissue swelling about the knee. Vascular calcifications are noted. IMPRESSION: 1. Acute periprosthetic fracture involving the supracondylar distal right femur. 2. Lucency coursing through the osteophyte arising from the upper pole of the patella suggestive of a nondisplaced fracture. 3. Large joint effusion. 4. Soft tissue swelling about the knee. 5. Vascular calcifications. Electronically Signed   By: Constance Holster M.D.   On: 07/22/2020 18:42   ECHOCARDIOGRAM COMPLETE  Result Date: 07/24/2020    ECHOCARDIOGRAM REPORT   Patient Name:   Regina Mathis Date of Exam: 07/23/2020 Medical Rec #:  [id number]       Height:       62.0 in Accession #:    [id number]      Weight:       147.7 lb Date of Birth:  12/21/1940      BSA:          1.681 m Patient Age:    56 years        BP:           149/62 mmHg Patient Gender: F               HR:           59 bpm. Exam  Location:  [facility] Procedure: 2D Echo, Cardiac Doppler and Color Doppler Indications:     R01.1 Murmur  History:         Patient has no prior history of Echocardiogram examinations.                  Risk Factors:Hypertension, Dyslipidemia and Prediabetes.                  Scoliosis.  Sonographer:     Wilford Sports Rodgers-Jones Referring Phys:  Nolberto Hanlon Diagnosing Phys: Bartholome Bill MD IMPRESSIONS  1. Left ventricular ejection fraction, by estimation, is 55 to 60%. Left ventricular ejection fraction by PLAX is 55 %. The left ventricle has normal function. The left ventricle has no regional wall motion abnormalities. Left ventricular diastolic parameters were normal.  2. Right ventricular systolic function is normal. The right ventricular size is normal.  3. Left atrial size was mildly dilated.  4. Right atrial size was mildly dilated.  5. The mitral valve is grossly normal. Mild mitral valve regurgitation.  6. The aortic valve is grossly normal. Aortic valve regurgitation is trivial. FINDINGS  Left Ventricle: Left ventricular ejection fraction, by estimation, is 55 to 60%. Left ventricular ejection fraction by PLAX is 55 %. The left ventricle has normal function. The left ventricle has no regional wall motion abnormalities. The left ventricular internal cavity size was normal in size. There is no left ventricular hypertrophy. Left ventricular diastolic parameters were normal. Right Ventricle: The right ventricular size is normal. No increase in right ventricular wall thickness. Right ventricular systolic function is normal. Left Atrium: Left atrial size was mildly dilated. Right Atrium: Right atrial size was mildly dilated. Pericardium: There is no evidence of pericardial effusion. Mitral Valve: The mitral valve is grossly normal. Mild mitral valve regurgitation. Tricuspid Valve: The tricuspid  valve is grossly normal. Tricuspid valve regurgitation is mild. Aortic Valve: The aortic valve is grossly normal. Aortic valve  regurgitation is trivial. Aortic valve mean gradient measures 9.3 mmHg. Aortic valve peak gradient measures 18.2 mmHg. Aortic valve area, by VTI measures 2.07 cm. Pulmonic Valve: The pulmonic valve was not well visualized. Pulmonic valve regurgitation is trivial. Aorta: The aortic root is normal in size and structure. IAS/Shunts: The interatrial septum was not assessed.  LEFT VENTRICLE PLAX 2D LV EF:         Left            Diastology                ventricular     LV e' medial:    6.74 cm/s                ejection        LV E/e' medial:  14.8                fraction by     LV e' lateral:   10.40 cm/s                PLAX is 55      LV E/e' lateral: 9.6                %. LVIDd:         4.54 cm LVIDs:         3.25 cm LV PW:         1.02 cm LV IVS:        1.02 cm LVOT diam:     2.10 cm LV SV:         85 LV SV Index:   50 LVOT Area:     3.46 cm  RIGHT VENTRICLE RV Basal diam:  4.34 cm RV S prime:     16.80 cm/s TAPSE (M-mode): 2.3 cm LEFT ATRIUM             Index       RIGHT ATRIUM           Index LA diam:        5.50 cm 3.27 cm/m  RA Area:     12.20 cm LA Vol (A2C):   74.6 ml 44.39 ml/m RA Volume:   25.80 ml  15.35 ml/m LA Vol (A4C):   48.4 ml 28.80 ml/m LA Biplane Vol: 60.3 ml 35.88 ml/m  AORTIC VALVE AV Area (Vmax):    2.31 cm AV Area (Vmean):   2.21 cm AV Area (VTI):     2.07 cm AV Vmax:           213.33 cm/s AV Vmean:          140.667 cm/s AV VTI:            0.410 m AV Peak Grad:      18.2 mmHg AV Mean Grad:      9.3 mmHg LVOT Vmax:         142.00 cm/s LVOT Vmean:        89.800 cm/s LVOT VTI:          0.245 m LVOT/AV VTI ratio: 0.60  AORTA Ao Root diam: 3.20 cm MITRAL VALVE               TRICUSPID VALVE MV Area (PHT): 3.17 cm    TR Peak grad:   42.5 mmHg MV Decel Time: 239 msec    TR Vmax:  326.00 cm/s MV E velocity: 99.80 cm/s MV A velocity: 83.90 cm/s  SHUNTS MV E/A ratio:  1.19        Systemic VTI:  0.24 m                            Systemic Diam: 2.10 cm Bartholome Bill MD Electronically signed  by Bartholome Bill MD Signature Date/Time: 07/24/2020/7:39:11 AM    Final    US Abdomen Limited RUQ (LIVER/GB)  Result Date: 07/24/2020 CLINICAL DATA:  Elevated LFTs EXAM: ULTRASOUND ABDOMEN LIMITED RIGHT UPPER QUADRANT COMPARISON:  CT AP 07/22/2020 FINDINGS: Gallbladder: Status post cholecystectomy. Common bile duct: Diameter: 15.1 mm Liver: No focal lesion identified. Within normal limits in parenchymal echogenicity. Portal vein is patent on color Doppler imaging with normal direction of blood flow towards the liver. Other: None. IMPRESSION: 1. No acute abnormality noted. 2. Increase caliber of the common bile duct status post cholecystectomy. In the absence of signs of biliary obstruction findings are favored to represent post cholecystectomy physiology. Electronically Signed   By: Kerby Moors M.D.   On: 07/24/2020 09:12      Impression / Plan:   Regina Mathis is a 79 y.o. female with history of A. fib, on Eliquis, hypertension, hyperlipidemia admitted after a fall s/p displaced fracture of right femur, closed fracture.  GI is consulted for elevated transaminases, currently being treated for UTI  Elevated AST and ALT: Unclear etiology, new onset since admission Currently not in liver failure Less likely ischemic hepatitis, patient did not have any hypotensive episodes since hospitalization Patient has received acetaminophen 1.3 g total which is within the expected drug administration for 24 hours.,  Received 1 dose of Percocet only.  She also received ceftriaxone IV x2 doses in last 2 days Ultrasound did not reveal hepatobiliary pathology except dilated CBD with no evidence of obstruction.  LFTs do not reveal obstructive pattern as well Recommend acute viral hepatitis panel Hold ceftriaxone since there is low clinical suspicion for UTI and urine cultures are pending, discussed with Dr. Kurtis Bushman who is agreeable Check coags, no signs of encephalopathy Monitor daily LFTs Avoid hepatotoxic  agents  Thank you for involving me in the care of this patient.     LOS: 2 days   Sherri Sear, MD  07/24/2020, 5:44 PM   Note: This dictation was prepared with Dragon dictation along with smaller phrase technology. Any transcriptional errors that result from this process are unintentional.

## 2020-07-24 NOTE — Progress Notes (Signed)
Subjective:  Hospital day #2.  Patient up out of bed to a chair.  Discussed with physical therapist patient's progress.  Patient had difficulty limiting her weightbearing on the fracture extremity according to Mr. Montine Circle, her physical therapist.  Objective:   VITALS:   Vitals:   07/23/20 2141 07/24/20 0155 07/24/20 0751 07/24/20 1618  BP: (!) 145/53 (!) 148/65 (!) 167/75 130/60  Pulse: (!) 56 (!) 53 (!) 53 (!) 54  Resp: 15 16 17 18   Temp: 97.6 F (36.4 C) 98.2 F (36.8 C) 98.4 F (36.9 C) 98.3 F (36.8 C)  TempSrc: Oral Oral Oral   SpO2: 98% 99% 93% 97%  Weight:      Height:        PHYSICAL EXAM: Right lower extremity Neurovascular intact Sensation intact distally Intact pulses distally Dorsiflexion/Plantar flexion intact No cellulitis present Compartment soft  LABS  Results for orders placed or performed during the hospital encounter of 07/22/20 (from the past 24 hour(s))  Comprehensive metabolic panel     Status: Abnormal   Collection Time: 07/24/20  4:32 AM  Result Value Ref Range   Sodium 137 135 - 145 mmol/L   Potassium 4.1 3.5 - 5.1 mmol/L   Chloride 102 98 - 111 mmol/L   CO2 27 22 - 32 mmol/L   Glucose, Bld 102 (H) 70 - 99 mg/dL   BUN 19 8 - 23 mg/dL   Creatinine, Ser 0.66 0.44 - 1.00 mg/dL   Calcium 8.5 (L) 8.9 - 10.3 mg/dL   Total Protein 5.9 (L) 6.5 - 8.1 g/dL   Albumin 3.3 (L) 3.5 - 5.0 g/dL   AST 228 (H) 15 - 41 U/L   ALT 275 (H) 0 - 44 U/L   Alkaline Phosphatase 97 38 - 126 U/L   Total Bilirubin 0.9 0.3 - 1.2 mg/dL   GFR, Estimated >60 >60 mL/min   Anion gap 8 5 - 15  Magnesium     Status: None   Collection Time: 07/24/20  4:32 AM  Result Value Ref Range   Magnesium 1.8 1.7 - 2.4 mg/dL    DG Wrist Complete Left  Result Date: 07/22/2020 CLINICAL DATA:  Pain EXAM: LEFT WRIST - COMPLETE 3+ VIEW COMPARISON:  None. FINDINGS: There is no evidence of fracture or dislocation. There is no evidence of arthropathy or other focal bone abnormality.  Soft tissues are unremarkable. IMPRESSION: Negative. Electronically Signed   By: Constance Holster M.D.   On: 07/22/2020 18:43   CT Head Wo Contrast  Result Date: 07/22/2020 CLINICAL DATA:  Fall with positive head strike on concrete, diffuse pain, on anticoagulation EXAM: CT HEAD WITHOUT CONTRAST CT CERVICAL SPINE WITHOUT CONTRAST CT ABDOMEN AND PELVIS WITH CONTRAST TECHNIQUE: Contiguous axial images were obtained from the base of the skull through the vertex without intravenous contrast. Multidetector CT imaging of the cervical spine was performed without intravenous contrast. Multiplanar CT image reconstructions were also generated. Multidetector CT imaging of the abdomen and pelvis was performed using the standard protocol following bolus administration of intravenous contrast. CONTRAST:  148mL OMNIPAQUE IOHEXOL 300 MG/ML  SOLN COMPARISON:  MR brain 01/31/2018, CT abdomen pelvis 09/01/2018, cervical spine radiograph 05/09/2013 FINDINGS: CT HEAD FINDINGS Brain: No evidence of acute infarction, hemorrhage, hydrocephalus, extra-axial collection, visible mass lesion or mass effect. Symmetric prominence of the ventricles, cisterns and sulci compatible with parenchymal volume loss. Patchy areas of white matter hypoattenuation are most compatible with chronic microvascular angiopathy. Vascular: Atherosclerotic calcification of the carotid siphons and intradural vertebral arteries.  No hyperdense vessel. Skull: No significant scalp swelling. No calvarial fracture or acute osseous injury. Sinuses/Orbits: Minimal thickening in the ethmoids. Paranasal sinuses and mastoid air cells are otherwise predominantly clear. Orbits are unremarkable. Other: None. CT CERVICAL SPINE FINDINGS Alignment: Cervical stabilization collar absent the time of examination. Mild focal reversal the normal cervical lordosis centered at C5-6 as well as some mild anterolisthesis C3 on 4 of approximately 3 mm,, favored to be on a degenerative  basis with cervical spondylitic changes at this level. No evidence of traumatic listhesis. No abnormally widened, perched or jumped facets. Normal alignment of the craniocervical and atlantoaxial articulations accounting for rightward cranial rotation. Skull base and vertebrae: No acute skull base fracture. No vertebral body fracture or height loss. The osseous structures appear diffusely demineralized which may limit detection of small or nondisplaced fractures. Photon starvation artifact seen below C5 may further limit detection of subtle anomaly. Atlantodental arthrosis with calcific pannus formation, nonspecific though can be seen with CPPD or rheumatoid arthropathy. Multilevel spondylitic changes, as detailed below. Soft tissues and spinal canal: No pre or paravertebral fluid or swelling. No visible canal hematoma. Disc levels: Multilevel intervertebral disc height loss with spondylitic endplate changes. Larger disc osteophyte complexes present C4-5, C5-6 with some ligamentum flavum infolding result in at most mild canal stenosis best seen on the orthogonal reconstructions. Additional uncinate spurring and facet hypertrophic changes present throughout the cervical spine resulting in mild-to-moderate neural foraminal narrowing diffusely more moderate to severe C4-C6. Upper chest: No acute abnormality in the upper chest or imaged lung apices. Other: Cervical carotid atherosclerosis. No worrisome thyroid nodules. CT ABDOMEN AND PELVIS FINDINGS Lower chest: Atelectatic changes present in the lung bases. Some additional scarring and/or subsegmental atelectasis seen in the right middle lobe and lingula. 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 Cardiac size is top normal. Three-vessel coronary artery atherosclerosis. No pericardial effusion. Hepatobiliary: No direct hepatic injury or perihepatic hematoma. No worrisome focal  liver lesions. Smooth liver surface contour. Normal hepatic attenuation. Patient is post cholecystectomy. Prominence of the extrahepatic biliary tree with the common bile duct measuring up to 16 mm in diameter which is unchanged from prior and likely related to a combination of senescent change and post cholecystectomy reservoir effect. Pancreas: No pancreatic contusive change or ductal disruption. Mild pancreatic atrophy. No pancreatic ductal dilatation or surrounding inflammatory changes. Spleen: No acute splenic injury or perisplenic hemorrhage. Geographic region of hyperenhancement seen in the spleen with some increasingly cystic appearance centrally, measuring up to 2.4 cm, previously 1.8 cm on comparison contrast enhanced CT from 2018. No other focal splenic lesion. Normal splenic size. Adrenals/Urinary Tract: Nodular thickening of the adrenal glands is similar to comparisons. Bilateral renal cortical scarring is again noted. Kidneys enhance and excrete symmetrically. No direct renal injury or perinephric hemorrhage. No extravasation of contrast on excretory delayed phase imaging. Large right renal staghorn calculus with some mild urothelial thickening is overall similar to comparison study which appears at most only partially obstructive given lack of calyceal dilatation. No new urolithiasis or frank hydronephrosis is seen. Urinary bladder is unremarkable. Stomach/Bowel: Mild edematous thickening of the distal thoracic esophagus. Prominence of gastric rugal likely related to underdistention. Duodenum is unremarkable with normal sweep across the midline abdomen. No small bowel thickening or dilatation. Cecum displaced to the midline abdomen. 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  No resulting mechanical obstruction or evidence of vascular compromise of the bowel loop. Only minimal hazy stranding of the  herniated fat contents, certainly decreased from comparison. No distal colonic thickening or dilatation. Scattered colonic diverticula without focal inflammation to suggest diverticulitis. Vascular/Lymphatic: Atherosclerotic calcifications within the abdominal aorta and branch vessels. No aneurysm or ectasia. No enlarged abdominopelvic lymph nodes. Reproductive: Normal appearance of the uterus and adnexal structures. Other: Nonspecific lobular low-attenuation collection in the inferior aspect of the right pericolic gutter measuring up to 10 Hounsfield units (2/65 and measuring 2.2 x 2.7 x 2.8 cm in size (2/65, 6/41) is essentially unchanged from comparison in 2018. No abdominopelvic free air or fluid. Mild body wall edema. 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 Tiny fat containing umbilical hernia. No other bowel containing hernias. 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 No acute traumatic osseous abnormality. Bones of the pelvis are intact and congruent. Some remote posterolateral lower thoracic left rib deformities are unchanged from prior. IMPRESSION: CT head: 1. No acute intracranial abnormality. No significant scalp swelling or calvarial fracture. 2. Chronic microvascular ischemic changes, parenchymal volume loss and intracranial atherosclerosis. CT cervical spine: 1. No acute fracture or traumatic listhesis of the cervical spine. 2. Multilevel degenerative changes of the cervical spine as described above. CT abdomen and pelvis: 1. No acute traumatic injury of the abdomen or pelvis. 2. Large right renal staghorn calculus with some mild urothelial thickening is overall similar to comparison study which appears at most only partially obstructive given lack of calyceal dilatation. Could correlate with features of urinalysis  to exclude superimposed urinary tract infection. 3. Geographic region of hyperenhancement in the spleen with some increasingly cystic appearance centrally, measuring up to 2.4 cm in size, previously 1.8 cm on comparison contrast enhanced CT from 2018. Findings are favored to represent a benign process such as a hemangioma or lymphangioma though could be further evaluated with 6 month follow-up MRI given some slow interval change in imaging characteristics this recommendation follows ACR consensus guidelines: White Paper of the ACR Incidental Findings Committee II on Splenic and Nodal Findings. J Am Coll Radiol [phone]. 4. Nonspecific lobular low-attenuation collection in the inferior aspect of the right pericolic gutter measuring up to 2.7 cm in size, essentially unchanged from comparison in comparison in 2018. Appearance and stability favors a benign etiology such as a seroma or lymphocele. 5. 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 No resulting mechanical obstruction or evidence of vascular compromise of the bowel loop. Some mild stranding in the herniated fat contents could reflect residual fat strangulation though is decreased from comparison. 6. Colonic diverticulosis without evidence of diverticulitis. 7. Mild body wall edema. 8. Aortic Atherosclerosis (ICD10-I70.0). Electronically Signed   By: Lovena Le M.D.   On: 07/22/2020 20:01   CT Cervical Spine Wo Contrast  Result Date: 07/22/2020 CLINICAL DATA:  Fall with positive head strike on concrete, diffuse pain, on anticoagulation EXAM: CT HEAD WITHOUT CONTRAST CT CERVICAL SPINE WITHOUT CONTRAST CT ABDOMEN AND PELVIS WITH CONTRAST TECHNIQUE: Contiguous axial images were obtained from the base of the skull through the vertex without intravenous contrast. Multidetector CT imaging of the cervical spine was performed without intravenous contrast. Multiplanar CT image  reconstructions were also generated. Multidetector CT imaging of the abdomen and pelvis was performed  using the standard protocol following bolus administration of intravenous contrast. CONTRAST:  158mL OMNIPAQUE IOHEXOL 300 MG/ML  SOLN COMPARISON:  MR brain 01/31/2018, CT abdomen pelvis 09/01/2018, cervical spine radiograph 05/09/2013 FINDINGS: CT HEAD FINDINGS Brain: No evidence of acute infarction, hemorrhage, hydrocephalus, extra-axial collection, visible mass lesion or mass effect. Symmetric prominence of the ventricles, cisterns and sulci compatible with parenchymal volume loss. Patchy areas of white matter hypoattenuation are most compatible with chronic microvascular angiopathy. Vascular: Atherosclerotic calcification of the carotid siphons and intradural vertebral arteries. No hyperdense vessel. Skull: No significant scalp swelling. No calvarial fracture or acute osseous injury. Sinuses/Orbits: Minimal thickening in the ethmoids. Paranasal sinuses and mastoid air cells are otherwise predominantly clear. Orbits are unremarkable. Other: None. CT CERVICAL SPINE FINDINGS Alignment: Cervical stabilization collar absent the time of examination. Mild focal reversal the normal cervical lordosis centered at C5-6 as well as some mild anterolisthesis C3 on 4 of approximately 3 mm,, favored to be on a degenerative basis with cervical spondylitic changes at this level. No evidence of traumatic listhesis. No abnormally widened, perched or jumped facets. Normal alignment of the craniocervical and atlantoaxial articulations accounting for rightward cranial rotation. Skull base and vertebrae: No acute skull base fracture. No vertebral body fracture or height loss. The osseous structures appear diffusely demineralized which may limit detection of small or nondisplaced fractures. Photon starvation artifact seen below C5 may further limit detection of subtle anomaly. Atlantodental arthrosis with calcific pannus formation,  nonspecific though can be seen with CPPD or rheumatoid arthropathy. Multilevel spondylitic changes, as detailed below. Soft tissues and spinal canal: No pre or paravertebral fluid or swelling. No visible canal hematoma. Disc levels: Multilevel intervertebral disc height loss with spondylitic endplate changes. Larger disc osteophyte complexes present C4-5, C5-6 with some ligamentum flavum infolding result in at most mild canal stenosis best seen on the orthogonal reconstructions. Additional uncinate spurring and facet hypertrophic changes present throughout the cervical spine resulting in mild-to-moderate neural foraminal narrowing diffusely more moderate to severe C4-C6. Upper chest: No acute abnormality in the upper chest or imaged lung apices. Other: Cervical carotid atherosclerosis. No worrisome thyroid nodules. CT ABDOMEN AND PELVIS FINDINGS Lower chest: Atelectatic changes present in the lung bases. Some additional scarring and/or subsegmental atelectasis seen in the right middle lobe and lingula. 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 Cardiac size is top normal. Three-vessel coronary artery atherosclerosis. No pericardial effusion. Hepatobiliary: No direct hepatic injury or perihepatic hematoma. No worrisome focal liver lesions. Smooth liver surface contour. Normal hepatic attenuation. Patient is post cholecystectomy. Prominence of the extrahepatic biliary tree with the common bile duct measuring up to 16 mm in diameter which is unchanged from prior and likely related to a combination of senescent change and post cholecystectomy reservoir effect. Pancreas: No pancreatic contusive change or ductal disruption. Mild pancreatic atrophy. No pancreatic ductal dilatation or surrounding inflammatory changes. Spleen: No acute splenic injury or perisplenic hemorrhage. Geographic region of hyperenhancement seen in the spleen  with some increasingly cystic appearance centrally, measuring up to 2.4 cm, previously 1.8 cm on comparison contrast enhanced CT from 2018. No other focal splenic lesion. Normal splenic size. Adrenals/Urinary Tract: Nodular thickening of the adrenal glands is similar to comparisons. Bilateral renal cortical scarring is again noted. Kidneys enhance and excrete symmetrically. No direct renal injury or perinephric hemorrhage. No extravasation of contrast on excretory delayed phase imaging. Large right renal staghorn calculus with some mild urothelial thickening is  overall similar to comparison study which appears at most only partially obstructive given lack of calyceal dilatation. No new urolithiasis or frank hydronephrosis is seen. Urinary bladder is unremarkable. Stomach/Bowel: Mild edematous thickening of the distal thoracic esophagus. Prominence of gastric rugal likely related to underdistention. Duodenum is unremarkable with normal sweep across the midline abdomen. No small bowel thickening or dilatation. Cecum displaced to the midline abdomen. 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 No resulting mechanical obstruction or evidence of vascular compromise of the bowel loop. Only minimal hazy stranding of the herniated fat contents, certainly decreased from comparison. No distal colonic thickening or dilatation. Scattered colonic diverticula without focal inflammation to suggest diverticulitis. Vascular/Lymphatic: Atherosclerotic calcifications within the abdominal aorta and branch vessels. No aneurysm or ectasia. No enlarged abdominopelvic lymph nodes. Reproductive: Normal appearance of the uterus and adnexal structures. Other: Nonspecific lobular low-attenuation collection in the inferior aspect of the right pericolic gutter measuring up to 10 Hounsfield units (2/65 and measuring 2.2 x 2.7 x 2.8 cm in size (2/65,  6/41) is essentially unchanged from comparison in 2018. No abdominopelvic free air or fluid. Mild body wall edema. 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 Tiny fat containing umbilical hernia. No other bowel containing hernias. 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 No acute traumatic osseous abnormality. Bones of the pelvis are intact and congruent. Some remote posterolateral lower thoracic left rib deformities are unchanged from prior. IMPRESSION: CT head: 1. No acute intracranial abnormality. No significant scalp swelling or calvarial fracture. 2. Chronic microvascular ischemic changes, parenchymal volume loss and intracranial atherosclerosis. CT cervical spine: 1. No acute fracture or traumatic listhesis of the cervical spine. 2. Multilevel degenerative changes of the cervical spine as described above. CT abdomen and pelvis: 1. No acute traumatic injury of the abdomen or pelvis. 2. Large right renal staghorn calculus with some mild urothelial thickening is overall similar to comparison study which appears at most only partially obstructive given lack of calyceal dilatation. Could correlate with features of urinalysis to exclude superimposed urinary tract infection. 3. Geographic region of hyperenhancement in the spleen with some increasingly cystic appearance centrally, measuring up to 2.4 cm in size, previously 1.8 cm on comparison contrast enhanced CT from 2018. Findings are favored to represent a benign process such as a hemangioma or lymphangioma though could be further evaluated with 6 month follow-up MRI given some slow interval change in imaging characteristics this recommendation follows ACR consensus guidelines: White Paper of the ACR Incidental Findings Committee II on Splenic and Nodal Findings. J Am Coll Radiol  [phone]. 4. Nonspecific lobular low-attenuation collection in the inferior aspect of the right pericolic gutter measuring up to 2.7 cm in size, essentially unchanged from comparison in comparison in 2018. Appearance and stability favors a benign etiology such as a seroma or lymphocele. 5. 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 No resulting mechanical obstruction or evidence of vascular compromise of the bowel loop. Some mild stranding in the herniated fat contents could reflect residual fat strangulation though is decreased from comparison. 6. Colonic diverticulosis without evidence of diverticulitis. 7. Mild body wall edema. 8. Aortic Atherosclerosis (ICD10-I70.0). Electronically Signed   By: Lovena Le M.D.   On: 07/22/2020 20:01  CT ABDOMEN PELVIS W CONTRAST  Result Date: 07/22/2020 CLINICAL DATA:  Fall with positive head strike on concrete, diffuse pain, on anticoagulation EXAM: CT HEAD WITHOUT CONTRAST CT CERVICAL SPINE WITHOUT CONTRAST CT ABDOMEN AND PELVIS WITH CONTRAST TECHNIQUE: Contiguous axial images were obtained from the base of the skull through the vertex without intravenous contrast. Multidetector CT imaging of the cervical spine was performed without intravenous contrast. Multiplanar CT image reconstructions were also generated. Multidetector CT imaging of the abdomen and pelvis was performed using the standard protocol following bolus administration of intravenous contrast. CONTRAST:  161mL OMNIPAQUE IOHEXOL 300 MG/ML  SOLN COMPARISON:  MR brain 01/31/2018, CT abdomen pelvis 09/01/2018, cervical spine radiograph 05/09/2013 FINDINGS: CT HEAD FINDINGS Brain: No evidence of acute infarction, hemorrhage, hydrocephalus, extra-axial collection, visible mass lesion or mass effect. Symmetric prominence of the ventricles, cisterns and sulci compatible with parenchymal volume loss. Patchy areas of white matter  hypoattenuation are most compatible with chronic microvascular angiopathy. Vascular: Atherosclerotic calcification of the carotid siphons and intradural vertebral arteries. No hyperdense vessel. Skull: No significant scalp swelling. No calvarial fracture or acute osseous injury. Sinuses/Orbits: Minimal thickening in the ethmoids. Paranasal sinuses and mastoid air cells are otherwise predominantly clear. Orbits are unremarkable. Other: None. CT CERVICAL SPINE FINDINGS Alignment: Cervical stabilization collar absent the time of examination. Mild focal reversal the normal cervical lordosis centered at C5-6 as well as some mild anterolisthesis C3 on 4 of approximately 3 mm,, favored to be on a degenerative basis with cervical spondylitic changes at this level. No evidence of traumatic listhesis. No abnormally widened, perched or jumped facets. Normal alignment of the craniocervical and atlantoaxial articulations accounting for rightward cranial rotation. Skull base and vertebrae: No acute skull base fracture. No vertebral body fracture or height loss. The osseous structures appear diffusely demineralized which may limit detection of small or nondisplaced fractures. Photon starvation artifact seen below C5 may further limit detection of subtle anomaly. Atlantodental arthrosis with calcific pannus formation, nonspecific though can be seen with CPPD or rheumatoid arthropathy. Multilevel spondylitic changes, as detailed below. Soft tissues and spinal canal: No pre or paravertebral fluid or swelling. No visible canal hematoma. Disc levels: Multilevel intervertebral disc height loss with spondylitic endplate changes. Larger disc osteophyte complexes present C4-5, C5-6 with some ligamentum flavum infolding result in at most mild canal stenosis best seen on the orthogonal reconstructions. Additional uncinate spurring and facet hypertrophic changes present throughout the cervical spine resulting in mild-to-moderate neural  foraminal narrowing diffusely more moderate to severe C4-C6. Upper chest: No acute abnormality in the upper chest or imaged lung apices. Other: Cervical carotid atherosclerosis. No worrisome thyroid nodules. CT ABDOMEN AND PELVIS FINDINGS Lower chest: Atelectatic changes present in the lung bases. Some additional scarring and/or subsegmental atelectasis seen in the right middle lobe and lingula. 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 Cardiac size is top normal. Three-vessel coronary artery atherosclerosis. No pericardial effusion. Hepatobiliary: No direct hepatic injury or perihepatic hematoma. No worrisome focal liver lesions. Smooth liver surface contour. Normal hepatic attenuation. Patient is post cholecystectomy. Prominence of the extrahepatic biliary tree with the common bile duct measuring up to 16 mm in diameter which is unchanged from prior and likely related to a combination of senescent change and post cholecystectomy reservoir effect. Pancreas: No pancreatic contusive change or ductal disruption. Mild pancreatic atrophy. No pancreatic ductal dilatation or surrounding inflammatory changes. Spleen: No acute splenic injury or perisplenic hemorrhage. Geographic region  of hyperenhancement seen in the spleen with some increasingly cystic appearance centrally, measuring up to 2.4 cm, previously 1.8 cm on comparison contrast enhanced CT from 2018. No other focal splenic lesion. Normal splenic size. Adrenals/Urinary Tract: Nodular thickening of the adrenal glands is similar to comparisons. Bilateral renal cortical scarring is again noted. Kidneys enhance and excrete symmetrically. No direct renal injury or perinephric hemorrhage. No extravasation of contrast on excretory delayed phase imaging. Large right renal staghorn calculus with some mild urothelial thickening is overall similar to comparison study which appears at  most only partially obstructive given lack of calyceal dilatation. No new urolithiasis or frank hydronephrosis is seen. Urinary bladder is unremarkable. Stomach/Bowel: Mild edematous thickening of the distal thoracic esophagus. Prominence of gastric rugal likely related to underdistention. Duodenum is unremarkable with normal sweep across the midline abdomen. No small bowel thickening or dilatation. Cecum displaced to the midline abdomen. 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 No resulting mechanical obstruction or evidence of vascular compromise of the bowel loop. Only minimal hazy stranding of the herniated fat contents, certainly decreased from comparison. No distal colonic thickening or dilatation. Scattered colonic diverticula without focal inflammation to suggest diverticulitis. Vascular/Lymphatic: Atherosclerotic calcifications within the abdominal aorta and branch vessels. No aneurysm or ectasia. No enlarged abdominopelvic lymph nodes. Reproductive: Normal appearance of the uterus and adnexal structures. Other: Nonspecific lobular low-attenuation collection in the inferior aspect of the right pericolic gutter measuring up to 10 Hounsfield units (2/65 and measuring 2.2 x 2.7 x 2.8 cm in size (2/65, 6/41) is essentially unchanged from comparison in 2018. No abdominopelvic free air or fluid. Mild body wall edema. 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 Tiny fat containing umbilical hernia. No other bowel containing hernias. 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 No acute traumatic osseous abnormality. Bones of the pelvis are intact and congruent. Some remote posterolateral lower thoracic left rib deformities are unchanged  from prior. IMPRESSION: CT head: 1. No acute intracranial abnormality. No significant scalp swelling or calvarial fracture. 2. Chronic microvascular ischemic changes, parenchymal volume loss and intracranial atherosclerosis. CT cervical spine: 1. No acute fracture or traumatic listhesis of the cervical spine. 2. Multilevel degenerative changes of the cervical spine as described above. CT abdomen and pelvis: 1. No acute traumatic injury of the abdomen or pelvis. 2. Large right renal staghorn calculus with some mild urothelial thickening is overall similar to comparison study which appears at most only partially obstructive given lack of calyceal dilatation. Could correlate with features of urinalysis to exclude superimposed urinary tract infection. 3. Geographic region of hyperenhancement in the spleen with some increasingly cystic appearance centrally, measuring up to 2.4 cm in size, previously 1.8 cm on comparison contrast enhanced CT from 2018. Findings are favored to represent a benign process such as a hemangioma or lymphangioma though could be further evaluated with 6 month follow-up MRI given some slow interval change in imaging characteristics this recommendation follows ACR consensus guidelines: White Paper of the ACR Incidental Findings Committee II on Splenic and Nodal Findings. J Am Coll Radiol [phone]. 4. Nonspecific lobular low-attenuation collection in the inferior aspect of the right pericolic gutter measuring up to 2.7 cm in size, essentially unchanged from comparison in comparison in 2018. Appearance and stability favors a benign etiology  such as a seroma or lymphocele. 5. 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 No resulting mechanical obstruction or evidence of vascular compromise of the bowel loop. Some mild stranding in the herniated fat contents could reflect residual fat strangulation though is decreased  from comparison. 6. Colonic diverticulosis without evidence of diverticulitis. 7. Mild body wall edema. 8. Aortic Atherosclerosis (ICD10-I70.0). Electronically Signed   By: Lovena Le M.D.   On: 07/22/2020 20:01   DG Knee Complete 4 Views Right  Result Date: 07/22/2020 CLINICAL DATA:  Pain status post fall EXAM: RIGHT KNEE - COMPLETE 4+ VIEW COMPARISON:  April 03, 2012 FINDINGS: There is an acute periprosthetic fracture involving the supracondylar distal right femur most notably on the lateral aspect of the femur. There is a large joint effusion. There is an apparent new osteophyte arising from the upper pole of the patella. There is a subtle lucency coursing through this osteophyte suggestive of a nondisplaced fracture. There is soft tissue swelling about the knee. Vascular calcifications are noted. IMPRESSION: 1. Acute periprosthetic fracture involving the supracondylar distal right femur. 2. Lucency coursing through the osteophyte arising from the upper pole of the patella suggestive of a nondisplaced fracture. 3. Large joint effusion. 4. Soft tissue swelling about the knee. 5. Vascular calcifications. Electronically Signed   By: Constance Holster M.D.   On: 07/22/2020 18:42   ECHOCARDIOGRAM COMPLETE  Result Date: 07/24/2020    ECHOCARDIOGRAM REPORT   Patient Name:   Regina Mathis Date of Exam: 07/23/2020 Medical Rec #:  [id number]       Height:       62.0 in Accession #:    [id number]      Weight:       147.7 lb Date of Birth:  09/16/1941      BSA:          1.681 m Patient Age:    29 years        BP:           149/62 mmHg Patient Gender: F               HR:           59 bpm. Exam Location:  [facility] Procedure: 2D Echo, Cardiac Doppler and Color Doppler Indications:     R01.1 Murmur  History:         Patient has no prior history of Echocardiogram examinations.                  Risk Factors:Hypertension, Dyslipidemia and Prediabetes.                  Scoliosis.  Sonographer:     Wilford Sports Rodgers-Jones  Referring Phys:  Nolberto Hanlon Diagnosing Phys: Bartholome Bill MD IMPRESSIONS  1. Left ventricular ejection fraction, by estimation, is 55 to 60%. Left ventricular ejection fraction by PLAX is 55 %. The left ventricle has normal function. The left ventricle has no regional wall motion abnormalities. Left ventricular diastolic parameters were normal.  2. Right ventricular systolic function is normal. The right ventricular size is normal.  3. Left atrial size was mildly dilated.  4. Right atrial size was mildly dilated.  5. The mitral valve is grossly normal. Mild mitral valve regurgitation.  6. The aortic valve is grossly normal. Aortic valve regurgitation is trivial. FINDINGS  Left Ventricle: Left ventricular ejection fraction, by estimation, is 55 to 60%. Left ventricular ejection  fraction by PLAX is 55 %. The left ventricle has normal function. The left ventricle has no regional wall motion abnormalities. The left ventricular internal cavity size was normal in size. There is no left ventricular hypertrophy. Left ventricular diastolic parameters were normal. Right Ventricle: The right ventricular size is normal. No increase in right ventricular wall thickness. Right ventricular systolic function is normal. Left Atrium: Left atrial size was mildly dilated. Right Atrium: Right atrial size was mildly dilated. Pericardium: There is no evidence of pericardial effusion. Mitral Valve: The mitral valve is grossly normal. Mild mitral valve regurgitation. Tricuspid Valve: The tricuspid valve is grossly normal. Tricuspid valve regurgitation is mild. Aortic Valve: The aortic valve is grossly normal. Aortic valve regurgitation is trivial. Aortic valve mean gradient measures 9.3 mmHg. Aortic valve peak gradient measures 18.2 mmHg. Aortic valve area, by VTI measures 2.07 cm. Pulmonic Valve: The pulmonic valve was not well visualized. Pulmonic valve regurgitation is trivial. Aorta: The aortic root is normal in size and structure.  IAS/Shunts: The interatrial septum was not assessed.  LEFT VENTRICLE PLAX 2D LV EF:         Left            Diastology                ventricular     LV e' medial:    6.74 cm/s                ejection        LV E/e' medial:  14.8                fraction by     LV e' lateral:   10.40 cm/s                PLAX is 55      LV E/e' lateral: 9.6                %. LVIDd:         4.54 cm LVIDs:         3.25 cm LV PW:         1.02 cm LV IVS:        1.02 cm LVOT diam:     2.10 cm LV SV:         85 LV SV Index:   50 LVOT Area:     3.46 cm  RIGHT VENTRICLE RV Basal diam:  4.34 cm RV S prime:     16.80 cm/s TAPSE (M-mode): 2.3 cm LEFT ATRIUM             Index       RIGHT ATRIUM           Index LA diam:        5.50 cm 3.27 cm/m  RA Area:     12.20 cm LA Vol (A2C):   74.6 ml 44.39 ml/m RA Volume:   25.80 ml  15.35 ml/m LA Vol (A4C):   48.4 ml 28.80 ml/m LA Biplane Vol: 60.3 ml 35.88 ml/m  AORTIC VALVE AV Area (Vmax):    2.31 cm AV Area (Vmean):   2.21 cm AV Area (VTI):     2.07 cm AV Vmax:           213.33 cm/s AV Vmean:          140.667 cm/s AV VTI:            0.410 m AV Peak Grad:  18.2 mmHg AV Mean Grad:      9.3 mmHg LVOT Vmax:         142.00 cm/s LVOT Vmean:        89.800 cm/s LVOT VTI:          0.245 m LVOT/AV VTI ratio: 0.60  AORTA Ao Root diam: 3.20 cm MITRAL VALVE               TRICUSPID VALVE MV Area (PHT): 3.17 cm    TR Peak grad:   42.5 mmHg MV Decel Time: 239 msec    TR Vmax:        326.00 cm/s MV E velocity: 99.80 cm/s MV A velocity: 83.90 cm/s  SHUNTS MV E/A ratio:  1.19        Systemic VTI:  0.24 m                            Systemic Diam: 2.10 cm Bartholome Bill MD Electronically signed by Bartholome Bill MD Signature Date/Time: 07/24/2020/7:39:11 AM    Final    US Abdomen Limited RUQ (LIVER/GB)  Result Date: 07/24/2020 CLINICAL DATA:  Elevated LFTs EXAM: ULTRASOUND ABDOMEN LIMITED RIGHT UPPER QUADRANT COMPARISON:  CT AP 07/22/2020 FINDINGS: Gallbladder: Status post cholecystectomy. Common bile duct:  Diameter: 15.1 mm Liver: No focal lesion identified. Within normal limits in parenchymal echogenicity. Portal vein is patent on color Doppler imaging with normal direction of blood flow towards the liver. Other: None. IMPRESSION: 1. No acute abnormality noted. 2. Increase caliber of the common bile duct status post cholecystectomy. In the absence of signs of biliary obstruction findings are favored to represent post cholecystectomy physiology. Electronically Signed   By: Kerby Moors M.D.   On: 07/24/2020 09:12    Assessment/Plan:     Principal Problem:   Fall Active Problems:   Atrial fibrillation ([place])   Essential hypertension   Dementia (HCC)   Hyperlipidemia   Primary osteoarthritis involving multiple joints   Displaced apophyseal fracture of right femur, initial encounter for closed fracture (HCC)  Have adjusted the patient's knee immobilizer.  Had fallen down the leg.  Patient still has mild tenderness around the distal femur laterally.  Patient is nonweightbearing on the right lower extremity.  She may not flex her right knee due to nondisplaced fractures of the distal femur and patella.  Given the nondisplaced nature of her fracture she is being treated nonoperatively.  She will require skilled nursing facility upon discharge.  Patient will follow up with me in the office within 1 to 2 weeks after discharge.      Thornton Park , MD 07/24/2020, 5:00 PM

## 2020-07-25 DIAGNOSIS — R7989 Other specified abnormal findings of blood chemistry: Secondary | ICD-10-CM | POA: Diagnosis not present

## 2020-07-25 LAB — URINALYSIS, COMPLETE (UACMP) WITH MICROSCOPIC
Bilirubin Urine: NEGATIVE
Glucose, UA: NEGATIVE mg/dL
Ketones, ur: NEGATIVE mg/dL
Nitrite: NEGATIVE
Protein, ur: NEGATIVE mg/dL
Specific Gravity, Urine: 1.009 (ref 1.005–1.030)
pH: 8 (ref 5.0–8.0)

## 2020-07-25 LAB — RESPIRATORY PANEL BY RT PCR (FLU A&B, COVID)
Influenza A by PCR: NEGATIVE
Influenza B by PCR: NEGATIVE
SARS Coronavirus 2 by RT PCR: NEGATIVE

## 2020-07-25 LAB — PROTIME-INR
INR: 1 (ref 0.8–1.2)
Prothrombin Time: 13 seconds (ref 11.4–15.2)

## 2020-07-25 LAB — HEPATIC FUNCTION PANEL
ALT: 152 U/L — ABNORMAL HIGH (ref 0–44)
AST: 62 U/L — ABNORMAL HIGH (ref 15–41)
Albumin: 3.4 g/dL — ABNORMAL LOW (ref 3.5–5.0)
Alkaline Phosphatase: 89 U/L (ref 38–126)
Bilirubin, Direct: 0.1 mg/dL (ref 0.0–0.2)
Indirect Bilirubin: 0.7 mg/dL (ref 0.3–0.9)
Total Bilirubin: 0.8 mg/dL (ref 0.3–1.2)
Total Protein: 6 g/dL — ABNORMAL LOW (ref 6.5–8.1)

## 2020-07-25 MED ORDER — GLYCERIN (LAXATIVE) 2.1 G RE SUPP
1.0000 | Freq: Once | RECTAL | Status: DC
  Filled 2020-07-25: qty 1

## 2020-07-25 MED ORDER — ENOXAPARIN SODIUM 40 MG/0.4ML ~~LOC~~ SOLN: 40.0000 mg | SUBCUTANEOUS | Status: AC

## 2020-07-25 MED ORDER — LISINOPRIL 5 MG PO TABS
5.0000 mg | ORAL_TABLET | Freq: Two times a day (BID) | ORAL | Status: DC
Start: 2020-07-25 — End: 2020-07-25

## 2020-07-25 MED ORDER — ACETAMINOPHEN 325 MG PO TABS: 650.0000 mg | ORAL_TABLET | Freq: Four times a day (QID) | ORAL | Status: AC | PRN

## 2020-07-25 MED ORDER — LIDOCAINE 5 % EX PTCH: 1.0000 | MEDICATED_PATCH | CUTANEOUS | 0 refills | Status: AC

## 2020-07-25 MED ORDER — LISINOPRIL 5 MG PO TABS: 5.0000 mg | ORAL_TABLET | Freq: Every day | ORAL | Status: AC

## 2020-07-25 MED ORDER — POLYETHYLENE GLYCOL 3350 17 G PO PACK: 17.0000 g | PACK | Freq: Every day | ORAL | 0 refills | Status: AC

## 2020-07-25 MED ORDER — LISINOPRIL 10 MG PO TABS: 10.0000 mg | ORAL_TABLET | Freq: Two times a day (BID) | ORAL | Status: DC

## 2020-07-25 MED ORDER — SENNOSIDES-DOCUSATE SODIUM 8.6-50 MG PO TABS
1.0000 | ORAL_TABLET | Freq: Two times a day (BID) | ORAL | Status: DC
  Filled 2020-07-25: qty 1

## 2020-07-25 MED ORDER — POLYETHYLENE GLYCOL 3350 17 G PO PACK: 17.0000 g | PACK | Freq: Every day | ORAL | Status: DC

## 2020-07-25 MED ORDER — ASPIRIN 81 MG PO TBEC: 81.0000 mg | DELAYED_RELEASE_TABLET | Freq: Every day | ORAL | 11 refills | Status: AC

## 2020-07-25 MED ORDER — LISINOPRIL 5 MG PO TABS
5.0000 mg | ORAL_TABLET | Freq: Two times a day (BID) | ORAL | Status: DC
  Administered 2020-07-25: 5 mg via ORAL
  Filled 2020-07-25: qty 1

## 2020-07-25 MED ORDER — SENNOSIDES-DOCUSATE SODIUM 8.6-50 MG PO TABS: 1.0000 | ORAL_TABLET | Freq: Two times a day (BID) | ORAL | Status: AC

## 2020-07-25 NOTE — Care Management Important Message (Signed)
Important Message  Patient Details  Name: Regina Mathis MRN: [id number] Date of Birth: May 19, 1941   Medicare Important Message Given:  Yes     Juliann Pulse A Lucindia Lemley 07/25/2020, 11:14 AM

## 2020-07-25 NOTE — Plan of Care (Signed)
  Problem: Education: Goal: Knowledge of General Education information will improve Description: Including pain rating scale, medication(s)/side effects and non-pharmacologic comfort measures Outcome: Progressing   Problem: Health Behavior/Discharge Planning: Goal: Ability to manage health-related needs will improve Outcome: Progressing   Problem: Clinical Measurements: Goal: Ability to maintain clinical measurements within normal limits will improve Outcome: Progressing Goal: Will remain free from infection Outcome: Progressing Goal: Diagnostic test results will improve Outcome: Progressing Goal: Respiratory complications will improve Outcome: Progressing Goal: Cardiovascular complication will be avoided Outcome: Progressing   Problem: Activity: Goal: Risk for activity intolerance will decrease Outcome: Progressing   Problem: Coping: Goal: Level of anxiety will decrease Outcome: Progressing   Problem: Nutrition: Goal: Adequate nutrition will be maintained Outcome: Progressing   Problem: Pain Managment: Goal: General experience of comfort will improve Outcome: Progressing   Problem: Safety: Goal: Ability to remain free from injury will improve Outcome: Progressing   Problem: Skin Integrity: Goal: Risk for impaired skin integrity will decrease Outcome: Progressing   

## 2020-07-25 NOTE — Progress Notes (Signed)
Regina Darby, MD [address]  [place]  [place], [place] [address]  Main: [phone]  Fax: [phone] Pager: [phone]   Subjective: Patient denies any GI symptoms today.  Her LFTs are improving. She is being discharged home today  Objective: Vital signs in last 24 hours: Vitals:   07/24/20 1618 07/24/20 2328 07/25/20 0755 07/25/20 1546  BP: 130/60 (!) 161/71 (!) 177/69 (!) 147/69  Pulse: (!) 54 (!) 54 (!) 48 (!) 55  Resp: 18 18 16 15   Temp: 98.3 F (36.8 C) 98.3 F (36.8 C) 98.3 F (36.8 C) 97.7 F (36.5 C)  TempSrc:  Oral Oral Oral  SpO2: 97% 98% 96% 99%  Weight:      Height:       Weight change:   Intake/Output Summary (Last 24 hours) at 07/25/2020 1550 Last data filed at 07/25/2020 0640 Gross per 24 hour  Intake --  Output 1300 ml  Net -1300 ml     Exam: Heart:: Regular rate and rhythm, S1S2 present or without murmur or extra heart sounds Lungs: normal and clear to auscultation Abdomen: soft, nontender, normal bowel sounds   Lab Results: CBC Latest Ref Rng & Units 07/23/2020 07/22/2020 12/11/2018  WBC 4.0 - 10.5 K/uL 4.4 9.8 4.8  Hemoglobin 12.0 - 15.0 g/dL 11.0(L) 12.1 12.1  Hematocrit 36 - 46 % 33.1(L) 37.4 35.9  Platelets 150 - 400 K/uL 176 207 224   CMP Latest Ref Rng & Units 07/25/2020 07/24/2020 07/23/2020  Glucose 70 - 99 mg/dL - 102(H) 119(H)  BUN 8 - 23 mg/dL - 19 25(H)  Creatinine 0.44 - 1.00 mg/dL - 0.66 0.72  Sodium 135 - 145 mmol/L - 137 140  Potassium 3.5 - 5.1 mmol/L - 4.1 3.0(L)  Chloride 98 - 111 mmol/L - 102 102  CO2 22 - 32 mmol/L - 27 29  Calcium 8.9 - 10.3 mg/dL - 8.5(L) 8.6(L)  Total Protein 6.5 - 8.1 g/dL 6.0(L) 5.9(L) 6.3(L)  Total Bilirubin 0.3 - 1.2 mg/dL 0.8 0.9 1.0  Alkaline Phos 38 - 126 U/L 89 97 68  AST 15 - 41 U/L 62(H) 228(H) 140(H)  ALT 0 - 44 U/L 152(H) 275(H) 53(H)    Micro Results: Recent Results (from the past 240 hour(s))  Respiratory Panel by RT PCR (Flu A&B, Covid) - Nasopharyngeal  Swab     Status: None   Collection Time: 07/22/20  9:05 PM   Specimen: Nasopharyngeal Swab  Result Value Ref Range Status   SARS Coronavirus 2 by RT PCR NEGATIVE NEGATIVE Final    Comment: (NOTE) SARS-CoV-2 target nucleic acids are NOT DETECTED.  The SARS-CoV-2 RNA is generally detectable in upper respiratoy specimens during the acute phase of infection. The lowest concentration of SARS-CoV-2 viral copies this assay can detect is 131 copies/mL. A negative result does not preclude SARS-Cov-2 infection and should not be used as the sole basis for treatment or other patient management decisions. A negative result may occur with  improper specimen collection/handling, submission of specimen other than nasopharyngeal swab, presence of viral mutation(s) within the areas targeted by this assay, and inadequate number of viral copies (<131 copies/mL). A negative result must be combined with clinical observations, patient history, and epidemiological information. The expected result is Negative.  Fact Sheet for Patients:  PinkCheek.be  Fact Sheet for Healthcare Providers:  GravelBags.it  This test is no t yet approved or cleared by the [place] FDA and  has been authorized for detection and/or diagnosis of  SARS-CoV-2 by FDA under an Emergency Use Authorization (EUA). This EUA will remain  in effect (meaning this test can be used) for the duration of the COVID-19 declaration under Section 564(b)(1) of the Act, 21 U.S.C. section 360bbb-3(b)(1), unless the authorization is terminated or revoked sooner.     Influenza A by PCR NEGATIVE NEGATIVE Final   Influenza B by PCR NEGATIVE NEGATIVE Final    Comment: (NOTE) The Xpert Xpress SARS-CoV-2/FLU/RSV assay is intended as an aid in  the diagnosis of influenza from Nasopharyngeal swab specimens and  should not be used as a sole basis for treatment. Nasal washings and  aspirates are  unacceptable for Xpert Xpress SARS-CoV-2/FLU/RSV  testing.  Fact Sheet for Patients: PinkCheek.be  Fact Sheet for Healthcare Providers: GravelBags.it  This test is not yet approved or cleared by the [place] FDA and  has been authorized for detection and/or diagnosis of SARS-CoV-2 by  FDA under an Emergency Use Authorization (EUA). This EUA will remain  in effect (meaning this test can be used) for the duration of the  Covid-19 declaration under Section 564(b)(1) of the Act, 21  U.S.C. section 360bbb-3(b)(1), unless the authorization is  terminated or revoked. Performed at [facility], [address] [place], [place] [address]   Respiratory Panel by RT PCR (Flu A&B, Covid) - Nasopharyngeal Swab     Status: None   Collection Time: 07/25/20 10:16 AM   Specimen: Nasopharyngeal Swab  Result Value Ref Range Status   SARS Coronavirus 2 by RT PCR NEGATIVE NEGATIVE Final    Comment: (NOTE) SARS-CoV-2 target nucleic acids are NOT DETECTED.  The SARS-CoV-2 RNA is generally detectable in upper respiratoy specimens during the acute phase of infection. The lowest concentration of SARS-CoV-2 viral copies this assay can detect is 131 copies/mL. A negative result does not preclude SARS-Cov-2 infection and should not be used as the sole basis for treatment or other patient management decisions. A negative result may occur with  improper specimen collection/handling, submission of specimen other than nasopharyngeal swab, presence of viral mutation(s) within the areas targeted by this assay, and inadequate number of viral copies (<131 copies/mL). A negative result must be combined with clinical observations, patient history, and epidemiological information. The expected result is Negative.  Fact Sheet for Patients:  PinkCheek.be  Fact Sheet for Healthcare Providers:    GravelBags.it  This test is no t yet approved or cleared by the [place] FDA and  has been authorized for detection and/or diagnosis of SARS-CoV-2 by FDA under an Emergency Use Authorization (EUA). This EUA will remain  in effect (meaning this test can be used) for the duration of the COVID-19 declaration under Section 564(b)(1) of the Act, 21 U.S.C. section 360bbb-3(b)(1), unless the authorization is terminated or revoked sooner.     Influenza A by PCR NEGATIVE NEGATIVE Final   Influenza B by PCR NEGATIVE NEGATIVE Final    Comment: (NOTE) The Xpert Xpress SARS-CoV-2/FLU/RSV assay is intended as an aid in  the diagnosis of influenza from Nasopharyngeal swab specimens and  should not be used as a sole basis for treatment. Nasal washings and  aspirates are unacceptable for Xpert Xpress SARS-CoV-2/FLU/RSV  testing.  Fact Sheet for Patients: PinkCheek.be  Fact Sheet for Healthcare Providers: GravelBags.it  This test is not yet approved or cleared by the [place] FDA and  has been authorized for detection and/or diagnosis of SARS-CoV-2 by  FDA under an Emergency Use Authorization (EUA). This EUA will remain  in effect (  meaning this test can be used) for the duration of the  Covid-19 declaration under Section 564(b)(1) of the Act, 21  U.S.C. section 360bbb-3(b)(1), unless the authorization is  terminated or revoked. Performed at [facility], [address] [place], [place] [address]    Studies/Results: ECHOCARDIOGRAM COMPLETE  Result Date: 07/24/2020    ECHOCARDIOGRAM REPORT   Patient Name:   Regina Mathis Date of Exam: 07/23/2020 Medical Rec #:  [id number]       Height:       62.0 in Accession #:    [id number]      Weight:       147.7 lb Date of Birth:  1941-05-29      BSA:          1.681 m Patient Age:    79 years        BP:           149/62 mmHg Patient Gender: F                HR:           59 bpm. Exam Location:  [facility] Procedure: 2D Echo, Cardiac Doppler and Color Doppler Indications:     R01.1 Murmur  History:         Patient has no prior history of Echocardiogram examinations.                  Risk Factors:Hypertension, Dyslipidemia and Prediabetes.                  Scoliosis.  Sonographer:     Wilford Sports Rodgers-Jones Referring Phys:  Nolberto Hanlon Diagnosing Phys: Bartholome Bill MD IMPRESSIONS  1. Left ventricular ejection fraction, by estimation, is 55 to 60%. Left ventricular ejection fraction by PLAX is 55 %. The left ventricle has normal function. The left ventricle has no regional wall motion abnormalities. Left ventricular diastolic parameters were normal.  2. Right ventricular systolic function is normal. The right ventricular size is normal.  3. Left atrial size was mildly dilated.  4. Right atrial size was mildly dilated.  5. The mitral valve is grossly normal. Mild mitral valve regurgitation.  6. The aortic valve is grossly normal. Aortic valve regurgitation is trivial. FINDINGS  Left Ventricle: Left ventricular ejection fraction, by estimation, is 55 to 60%. Left ventricular ejection fraction by PLAX is 55 %. The left ventricle has normal function. The left ventricle has no regional wall motion abnormalities. The left ventricular internal cavity size was normal in size. There is no left ventricular hypertrophy. Left ventricular diastolic parameters were normal. Right Ventricle: The right ventricular size is normal. No increase in right ventricular wall thickness. Right ventricular systolic function is normal. Left Atrium: Left atrial size was mildly dilated. Right Atrium: Right atrial size was mildly dilated. Pericardium: There is no evidence of pericardial effusion. Mitral Valve: The mitral valve is grossly normal. Mild mitral valve regurgitation. Tricuspid Valve: The tricuspid valve is grossly normal. Tricuspid valve regurgitation is mild. Aortic Valve: The aortic valve  is grossly normal. Aortic valve regurgitation is trivial. Aortic valve mean gradient measures 9.3 mmHg. Aortic valve peak gradient measures 18.2 mmHg. Aortic valve area, by VTI measures 2.07 cm. Pulmonic Valve: The pulmonic valve was not well visualized. Pulmonic valve regurgitation is trivial. Aorta: The aortic root is normal in size and structure. IAS/Shunts: The interatrial septum was not assessed.  LEFT VENTRICLE PLAX 2D LV EF:         Left  Diastology                ventricular     LV e' medial:    6.74 cm/s                ejection        LV E/e' medial:  14.8                fraction by     LV e' lateral:   10.40 cm/s                PLAX is 55      LV E/e' lateral: 9.6                %. LVIDd:         4.54 cm LVIDs:         3.25 cm LV PW:         1.02 cm LV IVS:        1.02 cm LVOT diam:     2.10 cm LV SV:         85 LV SV Index:   50 LVOT Area:     3.46 cm  RIGHT VENTRICLE RV Basal diam:  4.34 cm RV S prime:     16.80 cm/s TAPSE (M-mode): 2.3 cm LEFT ATRIUM             Index       RIGHT ATRIUM           Index LA diam:        5.50 cm 3.27 cm/m  RA Area:     12.20 cm LA Vol (A2C):   74.6 ml 44.39 ml/m RA Volume:   25.80 ml  15.35 ml/m LA Vol (A4C):   48.4 ml 28.80 ml/m LA Biplane Vol: 60.3 ml 35.88 ml/m  AORTIC VALVE AV Area (Vmax):    2.31 cm AV Area (Vmean):   2.21 cm AV Area (VTI):     2.07 cm AV Vmax:           213.33 cm/s AV Vmean:          140.667 cm/s AV VTI:            0.410 m AV Peak Grad:      18.2 mmHg AV Mean Grad:      9.3 mmHg LVOT Vmax:         142.00 cm/s LVOT Vmean:        89.800 cm/s LVOT VTI:          0.245 m LVOT/AV VTI ratio: 0.60  AORTA Ao Root diam: 3.20 cm MITRAL VALVE               TRICUSPID VALVE MV Area (PHT): 3.17 cm    TR Peak grad:   42.5 mmHg MV Decel Time: 239 msec    TR Vmax:        326.00 cm/s MV E velocity: 99.80 cm/s MV A velocity: 83.90 cm/s  SHUNTS MV E/A ratio:  1.19        Systemic VTI:  0.24 m                            Systemic Diam: 2.10 cm Bartholome Bill MD Electronically signed by Bartholome Bill MD Signature Date/Time: 07/24/2020/7:39:11 AM    Final    US Abdomen Limited RUQ (LIVER/GB)  Result Date: 07/24/2020 CLINICAL DATA:  Elevated  LFTs EXAM: ULTRASOUND ABDOMEN LIMITED RIGHT UPPER QUADRANT COMPARISON:  CT AP 07/22/2020 FINDINGS: Gallbladder: Status post cholecystectomy. Common bile duct: Diameter: 15.1 mm Liver: No focal lesion identified. Within normal limits in parenchymal echogenicity. Portal vein is patent on color Doppler imaging with normal direction of blood flow towards the liver. Other: None. IMPRESSION: 1. No acute abnormality noted. 2. Increase caliber of the common bile duct status post cholecystectomy. In the absence of signs of biliary obstruction findings are favored to represent post cholecystectomy physiology. Electronically Signed   By: Kerby Moors M.D.   On: 07/24/2020 09:12   Medications:  I have reviewed the patient's current medications. Prior to Admission:  Medications Prior to Admission  Medication Sig Dispense Refill Last Dose  . apixaban (ELIQUIS) 5 MG TABS tablet Take 5 mg by mouth 2 (two) times daily.    07/22/2020 at 1100  . ascorbic acid (VITAMIN C) 500 MG tablet Take 1,000 mg by mouth daily.   07/22/2020 at 1100  . cholecalciferol (VITAMIN D3) 25 MCG (1000 UNIT) tablet Take 2,000 Units by mouth daily.   07/22/2020 at 1100  . citalopram (CELEXA) 10 MG tablet Take 10 mg by mouth daily.   07/22/2020 at 1100  . donepezil (ARICEPT) 10 MG tablet Take 10 mg by mouth at bedtime.   07/22/2020 at 1100  . furosemide (LASIX) 20 MG tablet Take 20 mg by mouth.   07/22/2020 at 1100  . lisinopril-hydrochlorothiazide (ZESTORETIC) 20-25 MG tablet Take 1 tablet by mouth daily.   07/22/2020 at 1100  . potassium chloride (KLOR-CON) 10 MEQ tablet Take 10 mEq by mouth daily.   07/22/2020 at 1100  . simvastatin (ZOCOR) 40 MG tablet Take 40 mg by mouth daily.   07/21/2020 at 2100  . tiZANidine (ZANAFLEX) 2 MG tablet Take 2 mg by  mouth every 6 (six) hours as needed for muscle spasms.   unknown at prn   Scheduled: . aspirin EC  81 mg Oral Daily  . enoxaparin (LOVENOX) injection  40 mg Subcutaneous Q24H  . lidocaine  1 patch Transdermal Q24H  . lisinopril  5 mg Oral BID  . polyethylene glycol  17 g Oral Daily  . senna-docusate  1 tablet Oral BID   Continuous:  IOX:[id number] **OR** acetaminophen, bisacodyl, morphine injection, ondansetron **OR** ondansetron (ZOFRAN) IV Anti-infectives (From admission, onward)   Start     Dose/Rate Route Frequency Ordered Stop   07/23/20 0400  cefTRIAXone (ROCEPHIN) 1 g in sodium chloride 0.9 % 100 mL IVPB  Status:  Discontinued        1 g 200 mL/hr over 30 Minutes Intravenous Every 24 hours 07/23/20 0323 07/24/20 1535     Scheduled Meds: . aspirin EC  81 mg Oral Daily  . enoxaparin (LOVENOX) injection  40 mg Subcutaneous Q24H  . lidocaine  1 patch Transdermal Q24H  . lisinopril  5 mg Oral BID  . polyethylene glycol  17 g Oral Daily  . senna-docusate  1 tablet Oral BID   Continuous Infusions: PRN Meds:.acetaminophen **OR** acetaminophen, bisacodyl, morphine injection, ondansetron **OR** ondansetron (ZOFRAN) IV   Assessment: Principal Problem:   Fall Active Problems:   Atrial fibrillation (HCC)   Essential hypertension   Dementia (HCC)   Hyperlipidemia   Primary osteoarthritis involving multiple joints   Displaced apophyseal fracture of right femur, initial encounter for closed fracture (HCC)    Plan: Elevated transaminases: Improving Likely secondary to Rocephin use which has been discontinued Acute viral hepatitis panel negative Follow-up with GI as  outpatient if LFTs remain persistently elevated Tylenol use not more than 2 g/day  GI will sign off at this time, please call us back with questions or concerns   LOS: 3 days   Jeryn Cerney 07/25/2020, 3:50 PM

## 2020-07-25 NOTE — Discharge Summary (Signed)
LILYANNE MCQUOWN XAJ:[id number] DOB: July 06, 1941 DOA: 07/22/2020  PCP: Crissie Figures, PA-C  Admit date: 07/22/2020 Discharge date: 07/25/2020  Admitted From: home Disposition:  SNF  Recommendations for Outpatient Follow-up:  1. Follow up with PCP in 1 week 2. Please obtain BMP/CBC in one week 3. Cardiology in one week to discuss and reassess anticoagulation 4. Follow-up with orthopedics Dr. Mack Guise in 1 week     Discharge Condition:Stable CODE STATUS:full  Diet recommendation: Heart Healthy   Brief/Interim Summary: ZABDI MIS is a 79 y.o. female with medical history significant of osteoporosis, osteoarthritis status post total knee replacement, hyperlipidemia, atrial fibrillation on chronic anticoagulation, hypertension, GERD, scoliosis, and valvular heart disease who sustained a mechanical fall at home.  Patient has had recurrent falls apparently lately.She has failed to thrive well at home.  Was admitted to the hospital service.  CAT scan with results below, negative for acute abnormality or fractures.  Orthopedics was also consulted.  She was empirically started on IV positive UA however urine cultures were not available.  Her liver enzymes were trending upward and it was thought it was due to Rocephin.  Rocephin was discontinued yesterday and her LFTs 2 days are improving.  #1 status post fall: Patient with recurrent falls  Likely some gait instability  PT OT recommend SNF    #2 Right periprosthetic fracture of the right knee: Orthopedics was consulted input was appreciated -after reviewing the x-ray plan is to do nonoperative management given her minimally displaced distal femur fracture and nondisplaced patella fracture -Continue with knee immobilizer, nonweightbearing of the right lower extremity and should avoid knee flexion to avoid placing displacing forces on her patella.  Knee immobilizer at all times on right knee.  Needs to follow-up with Dr. Jacklynn Ganong  in 1 to 2 weeks after discharge Must use a walker for assistance with ambulation and must have her knee immobilizer at all times on   #3 atrial fibrillation:  Rate controlled  On Eliquis at home but due to falls held  Her anticoagulation should be reevaluated as outpatient in the near future by her cardiologist  Will place her on aspirin for now   #4 dementia: Stable, continue on Aricept   #5 essential hypertension: Blood pressure initially was low and with IV fluids have increased. We will start her on lisinopril 5 mg daily increase as tolerated.  Hold hydrochlorothiazide and Lasix home doses.  #6 osteoarthritis of multiple joints: Continue with lidocaine patch   #7 hyperlipidemia:On statins as outpatient.  Was held due to increased LFTs.  Once a repeat blood work revealed stabilization of LFT can resume statins.  #8-UTI-  UA was positive.    No urine culture available Please see CT scan result which patient has a staghorn stone. Will need to follow-up with urology as outpatient Did receive Rocephin initially but was discontinued due to possible cause of her increased LFTs Continue on Rocephin  #9-murmur-echo with mild MR  #10-hypokalemia- Resolved with supplementation  #11-elevated LFT's-  Obtained ruq [place]- no acute abn.  GI was consulted, Dr. Marius Ditch saw the patient Rocephin was discontinued.  Hepatitis panel was nonreactive. Possibly due to antibiotics todays lab shows LFT trending downward.continue to monitor.    Discharge Diagnoses:  Principal Problem:   Fall Active Problems:   Atrial fibrillation ([place])   Essential hypertension   Dementia ([place])   Hyperlipidemia   Primary osteoarthritis involving multiple joints   Displaced apophyseal fracture of right femur, initial encounter for closed fracture ([place])  Discharge Instructions   Allergies as of 07/25/2020   No Known Allergies     Medication List    STOP taking these medications    apixaban 5 MG Tabs tablet Commonly known as: ELIQUIS   furosemide 20 MG tablet Commonly known as: LASIX   lisinopril-hydrochlorothiazide 20-25 MG tablet Commonly known as: ZESTORETIC   potassium chloride 10 MEQ tablet Commonly known as: KLOR-CON   simvastatin 40 MG tablet Commonly known as: ZOCOR     TAKE these medications   acetaminophen 325 MG tablet Commonly known as: TYLENOL Take 2 tablets (650 mg total) by mouth every 6 (six) hours as needed for mild pain (or Fever >/= 101).   ascorbic acid 500 MG tablet Commonly known as: VITAMIN C Take 1,000 mg by mouth daily.   aspirin 81 MG EC tablet Take 1 tablet (81 mg total) by mouth daily. Swallow whole. Start taking on: July 26, 2020   cholecalciferol 25 MCG (1000 UNIT) tablet Commonly known as: VITAMIN D3 Take 2,000 Units by mouth daily.   citalopram 10 MG tablet Commonly known as: CELEXA Take 10 mg by mouth daily.   donepezil 10 MG tablet Commonly known as: ARICEPT Take 10 mg by mouth at bedtime.   enoxaparin 40 MG/0.4ML injection Commonly known as: LOVENOX Inject 0.4 mLs (40 mg total) into the skin daily. Start taking on: July 26, 2020   lidocaine 5 % Commonly known as: McDonald 1 patch onto the skin daily. Remove & Discard patch within 12 hours or as directed by MD   lisinopril 5 MG tablet Commonly known as: ZESTRIL Take 1 tablet (5 mg total) by mouth daily.   polyethylene glycol 17 g packet Commonly known as: MIRALAX / GLYCOLAX Take 17 g by mouth daily.   senna-docusate 8.6-50 MG tablet Commonly known as: Senokot-S Take 1 tablet by mouth 2 (two) times daily.   tiZANidine 2 MG tablet Commonly known as: ZANAFLEX Take 2 mg by mouth every 6 (six) hours as needed for muscle spasms.       Contact information for follow-up providers    Corey Skains, MD Follow up in 1 week(s).   Specialty: Cardiology Why: reassess anticoagulation Contact information: [address] [facility] [place] [address] [phone]        Crissie Figures, PA-C Follow up in 1 week(s).   Specialty: Physician Assistant Contact information: [address] [place] [address] [phone]        Thornton Park, MD Follow up in 1 week(s).   Specialty: Orthopedic Surgery Contact information: [place] [address] [phone]            Contact information for after-discharge care    Destination    [facility] Preferred SNF .   Service: Skilled Nursing Contact information: [address] [place] [phone]                 No Known Allergies  Consultations:  Orthopedics and GI   Procedures/Studies: DG Wrist Complete Left  Result Date: 07/22/2020 CLINICAL DATA:  Pain EXAM: LEFT WRIST - COMPLETE 3+ VIEW COMPARISON:  None. FINDINGS: There is no evidence of fracture or dislocation. There is no evidence of arthropathy or other focal bone abnormality. Soft tissues are unremarkable. IMPRESSION: Negative. Electronically Signed   By: Constance Holster M.D.   On: 07/22/2020 18:43   CT Head Wo Contrast  Result Date: 07/22/2020 CLINICAL DATA:  Fall with positive  head strike on concrete, diffuse pain, on anticoagulation EXAM: CT HEAD WITHOUT CONTRAST CT CERVICAL SPINE WITHOUT CONTRAST CT ABDOMEN AND PELVIS WITH CONTRAST TECHNIQUE: Contiguous axial images were obtained from the base of the skull through the vertex without intravenous contrast. Multidetector CT imaging of the cervical spine was performed without intravenous contrast. Multiplanar CT image reconstructions were also generated. Multidetector CT imaging of the abdomen and pelvis was performed using the standard protocol following bolus administration of intravenous contrast. CONTRAST:  133mL OMNIPAQUE IOHEXOL 300 MG/ML  SOLN COMPARISON:  MR brain 01/31/2018, CT abdomen pelvis 09/01/2018, cervical spine  radiograph 05/09/2013 FINDINGS: CT HEAD FINDINGS Brain: No evidence of acute infarction, hemorrhage, hydrocephalus, extra-axial collection, visible mass lesion or mass effect. Symmetric prominence of the ventricles, cisterns and sulci compatible with parenchymal volume loss. Patchy areas of white matter hypoattenuation are most compatible with chronic microvascular angiopathy. Vascular: Atherosclerotic calcification of the carotid siphons and intradural vertebral arteries. No hyperdense vessel. Skull: No significant scalp swelling. No calvarial fracture or acute osseous injury. Sinuses/Orbits: Minimal thickening in the ethmoids. Paranasal sinuses and mastoid air cells are otherwise predominantly clear. Orbits are unremarkable. Other: None. CT CERVICAL SPINE FINDINGS Alignment: Cervical stabilization collar absent the time of examination. Mild focal reversal the normal cervical lordosis centered at C5-6 as well as some mild anterolisthesis C3 on 4 of approximately 3 mm,, favored to be on a degenerative basis with cervical spondylitic changes at this level. No evidence of traumatic listhesis. No abnormally widened, perched or jumped facets. Normal alignment of the craniocervical and atlantoaxial articulations accounting for rightward cranial rotation. Skull base and vertebrae: No acute skull base fracture. No vertebral body fracture or height loss. The osseous structures appear diffusely demineralized which may limit detection of small or nondisplaced fractures. Photon starvation artifact seen below C5 may further limit detection of subtle anomaly. Atlantodental arthrosis with calcific pannus formation, nonspecific though can be seen with CPPD or rheumatoid arthropathy. Multilevel spondylitic changes, as detailed below. Soft tissues and spinal canal: No pre or paravertebral fluid or swelling. No visible canal hematoma. Disc levels: Multilevel intervertebral disc height loss with spondylitic endplate changes. Larger  disc osteophyte complexes present C4-5, C5-6 with some ligamentum flavum infolding result in at most mild canal stenosis best seen on the orthogonal reconstructions. Additional uncinate spurring and facet hypertrophic changes present throughout the cervical spine resulting in mild-to-moderate neural foraminal narrowing diffusely more moderate to severe C4-C6. Upper chest: No acute abnormality in the upper chest or imaged lung apices. Other: Cervical carotid atherosclerosis. No worrisome thyroid nodules. CT ABDOMEN AND PELVIS FINDINGS Lower chest: Atelectatic changes present in the lung bases. Some additional scarring and/or subsegmental atelectasis seen in the right middle lobe and lingula. 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 Cardiac size is top normal. Three-vessel coronary artery atherosclerosis. No pericardial effusion. Hepatobiliary: No direct hepatic injury or perihepatic hematoma. No worrisome focal liver lesions. Smooth liver surface contour. Normal hepatic attenuation. Patient is post cholecystectomy. Prominence of the extrahepatic biliary tree with the common bile duct measuring up to 16 mm in diameter which is unchanged from prior and likely related to a combination of senescent change and post cholecystectomy reservoir effect. Pancreas: No pancreatic contusive change or ductal disruption. Mild pancreatic atrophy. No pancreatic ductal dilatation or surrounding inflammatory changes. Spleen: No acute splenic injury or perisplenic hemorrhage. Geographic region of hyperenhancement seen in the spleen with some increasingly cystic appearance centrally, measuring up to  2.4 cm, previously 1.8 cm on comparison contrast enhanced CT from 2018. No other focal splenic lesion. Normal splenic size. Adrenals/Urinary Tract: Nodular thickening of the adrenal glands is similar to comparisons. Bilateral renal cortical scarring is  again noted. Kidneys enhance and excrete symmetrically. No direct renal injury or perinephric hemorrhage. No extravasation of contrast on excretory delayed phase imaging. Large right renal staghorn calculus with some mild urothelial thickening is overall similar to comparison study which appears at most only partially obstructive given lack of calyceal dilatation. No new urolithiasis or frank hydronephrosis is seen. Urinary bladder is unremarkable. Stomach/Bowel: Mild edematous thickening of the distal thoracic esophagus. Prominence of gastric rugal likely related to underdistention. Duodenum is unremarkable with normal sweep across the midline abdomen. No small bowel thickening or dilatation. Cecum displaced to the midline abdomen. 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 No resulting mechanical obstruction or evidence of vascular compromise of the bowel loop. Only minimal hazy stranding of the herniated fat contents, certainly decreased from comparison. No distal colonic thickening or dilatation. Scattered colonic diverticula without focal inflammation to suggest diverticulitis. Vascular/Lymphatic: Atherosclerotic calcifications within the abdominal aorta and branch vessels. No aneurysm or ectasia. No enlarged abdominopelvic lymph nodes. Reproductive: Normal appearance of the uterus and adnexal structures. Other: Nonspecific lobular low-attenuation collection in the inferior aspect of the right pericolic gutter measuring up to 10 Hounsfield units (2/65 and measuring 2.2 x 2.7 x 2.8 cm in size (2/65, 6/41) is essentially unchanged from comparison in 2018. No abdominopelvic free air or fluid. Mild body wall edema. 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 Tiny fat containing umbilical hernia. No other bowel containing hernias. Musculoskeletal: Severe dextrocurvature of the lumbar spine with an apex at L2 and  stable severe multilevel discogenic and facet degenerative change with lateral compression deformities of the spine, as seen previously. No acute traumatic osseous abnormality. Bones of the pelvis are intact and congruent. Some remote posterolateral lower thoracic left rib deformities are unchanged from prior. IMPRESSION: CT head: 1. No acute intracranial abnormality. No significant scalp swelling or calvarial fracture. 2. Chronic microvascular ischemic changes, parenchymal volume loss and intracranial atherosclerosis. CT cervical spine: 1. No acute fracture or traumatic listhesis of the cervical spine. 2. Multilevel degenerative changes of the cervical spine as described above. CT abdomen and pelvis: 1. No acute traumatic injury of the abdomen or pelvis. 2. Large right renal staghorn calculus with some mild urothelial thickening is overall similar to comparison study which appears at most only partially obstructive given lack of calyceal dilatation. Could correlate with features of urinalysis to exclude superimposed urinary tract infection. 3. Geographic region of hyperenhancement in the spleen with some increasingly cystic appearance centrally, measuring up to 2.4 cm in size, previously 1.8 cm on comparison contrast enhanced CT from 2018. Findings are favored to represent a benign process such as a hemangioma or lymphangioma though could be further evaluated with 6 month follow-up MRI given some slow interval change in imaging characteristics this recommendation follows ACR consensus guidelines: White Paper of the ACR Incidental Findings Committee II on Splenic and Nodal Findings. J Am Coll Radiol [phone]. 4. Nonspecific lobular low-attenuation collection in the inferior aspect of the right pericolic gutter measuring up to 2.7 cm in size, essentially unchanged from comparison in comparison in 2018. Appearance and stability favors a benign etiology such as a seroma or lymphocele. 5. Portion of the ascending  and transverse colon  interposed anterior to the liver protruding through a right Morgagni hernia. No resulting mechanical obstruction or evidence of vascular compromise of the bowel loop. Some mild stranding in the herniated fat contents could reflect residual fat strangulation though is decreased from comparison. 6. Colonic diverticulosis without evidence of diverticulitis. 7. Mild body wall edema. 8. Aortic Atherosclerosis (ICD10-I70.0). Electronically Signed   By: Lovena Le M.D.   On: 07/22/2020 20:01   CT Cervical Spine Wo Contrast  Result Date: 07/22/2020 CLINICAL DATA:  Fall with positive head strike on concrete, diffuse pain, on anticoagulation EXAM: CT HEAD WITHOUT CONTRAST CT CERVICAL SPINE WITHOUT CONTRAST CT ABDOMEN AND PELVIS WITH CONTRAST TECHNIQUE: Contiguous axial images were obtained from the base of the skull through the vertex without intravenous contrast. Multidetector CT imaging of the cervical spine was performed without intravenous contrast. Multiplanar CT image reconstructions were also generated. Multidetector CT imaging of the abdomen and pelvis was performed using the standard protocol following bolus administration of intravenous contrast. CONTRAST:  179mL OMNIPAQUE IOHEXOL 300 MG/ML  SOLN COMPARISON:  MR brain 01/31/2018, CT abdomen pelvis 09/01/2018, cervical spine radiograph 05/09/2013 FINDINGS: CT HEAD FINDINGS Brain: No evidence of acute infarction, hemorrhage, hydrocephalus, extra-axial collection, visible mass lesion or mass effect. Symmetric prominence of the ventricles, cisterns and sulci compatible with parenchymal volume loss. Patchy areas of white matter hypoattenuation are most compatible with chronic microvascular angiopathy. Vascular: Atherosclerotic calcification of the carotid siphons and intradural vertebral arteries. No hyperdense vessel. Skull: No significant scalp swelling. No calvarial fracture or acute osseous injury. Sinuses/Orbits: Minimal thickening in the  ethmoids. Paranasal sinuses and mastoid air cells are otherwise predominantly clear. Orbits are unremarkable. Other: None. CT CERVICAL SPINE FINDINGS Alignment: Cervical stabilization collar absent the time of examination. Mild focal reversal the normal cervical lordosis centered at C5-6 as well as some mild anterolisthesis C3 on 4 of approximately 3 mm,, favored to be on a degenerative basis with cervical spondylitic changes at this level. No evidence of traumatic listhesis. No abnormally widened, perched or jumped facets. Normal alignment of the craniocervical and atlantoaxial articulations accounting for rightward cranial rotation. Skull base and vertebrae: No acute skull base fracture. No vertebral body fracture or height loss. The osseous structures appear diffusely demineralized which may limit detection of small or nondisplaced fractures. Photon starvation artifact seen below C5 may further limit detection of subtle anomaly. Atlantodental arthrosis with calcific pannus formation, nonspecific though can be seen with CPPD or rheumatoid arthropathy. Multilevel spondylitic changes, as detailed below. Soft tissues and spinal canal: No pre or paravertebral fluid or swelling. No visible canal hematoma. Disc levels: Multilevel intervertebral disc height loss with spondylitic endplate changes. Larger disc osteophyte complexes present C4-5, C5-6 with some ligamentum flavum infolding result in at most mild canal stenosis best seen on the orthogonal reconstructions. Additional uncinate spurring and facet hypertrophic changes present throughout the cervical spine resulting in mild-to-moderate neural foraminal narrowing diffusely more moderate to severe C4-C6. Upper chest: No acute abnormality in the upper chest or imaged lung apices. Other: Cervical carotid atherosclerosis. No worrisome thyroid nodules. CT ABDOMEN AND PELVIS FINDINGS Lower chest: Atelectatic changes present in the lung bases. Some additional scarring  and/or subsegmental atelectasis seen in the right middle lobe and lingula. 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 Cardiac size is top normal. Three-vessel coronary artery atherosclerosis. No pericardial effusion. Hepatobiliary: No direct hepatic injury or perihepatic hematoma. No worrisome focal liver lesions. Smooth liver surface  contour. Normal hepatic attenuation. Patient is post cholecystectomy. Prominence of the extrahepatic biliary tree with the common bile duct measuring up to 16 mm in diameter which is unchanged from prior and likely related to a combination of senescent change and post cholecystectomy reservoir effect. Pancreas: No pancreatic contusive change or ductal disruption. Mild pancreatic atrophy. No pancreatic ductal dilatation or surrounding inflammatory changes. Spleen: No acute splenic injury or perisplenic hemorrhage. Geographic region of hyperenhancement seen in the spleen with some increasingly cystic appearance centrally, measuring up to 2.4 cm, previously 1.8 cm on comparison contrast enhanced CT from 2018. No other focal splenic lesion. Normal splenic size. Adrenals/Urinary Tract: Nodular thickening of the adrenal glands is similar to comparisons. Bilateral renal cortical scarring is again noted. Kidneys enhance and excrete symmetrically. No direct renal injury or perinephric hemorrhage. No extravasation of contrast on excretory delayed phase imaging. Large right renal staghorn calculus with some mild urothelial thickening is overall similar to comparison study which appears at most only partially obstructive given lack of calyceal dilatation. No new urolithiasis or frank hydronephrosis is seen. Urinary bladder is unremarkable. Stomach/Bowel: Mild edematous thickening of the distal thoracic esophagus. Prominence of gastric rugal likely related to underdistention. Duodenum is unremarkable with  normal sweep across the midline abdomen. No small bowel thickening or dilatation. Cecum displaced to the midline abdomen. 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 No resulting mechanical obstruction or evidence of vascular compromise of the bowel loop. Only minimal hazy stranding of the herniated fat contents, certainly decreased from comparison. No distal colonic thickening or dilatation. Scattered colonic diverticula without focal inflammation to suggest diverticulitis. Vascular/Lymphatic: Atherosclerotic calcifications within the abdominal aorta and branch vessels. No aneurysm or ectasia. No enlarged abdominopelvic lymph nodes. Reproductive: Normal appearance of the uterus and adnexal structures. Other: Nonspecific lobular low-attenuation collection in the inferior aspect of the right pericolic gutter measuring up to 10 Hounsfield units (2/65 and measuring 2.2 x 2.7 x 2.8 cm in size (2/65, 6/41) is essentially unchanged from comparison in 2018. No abdominopelvic free air or fluid. Mild body wall edema. 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 Tiny fat containing umbilical hernia. No other bowel containing hernias. 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 No acute traumatic osseous abnormality. Bones of the pelvis are intact and congruent. Some remote posterolateral lower thoracic left rib deformities are unchanged from prior. IMPRESSION: CT head: 1. No acute intracranial abnormality. No significant scalp swelling or calvarial fracture. 2. Chronic microvascular ischemic changes, parenchymal volume loss and intracranial atherosclerosis. CT cervical spine: 1. No acute fracture or traumatic listhesis of the cervical spine. 2.  Multilevel degenerative changes of the cervical spine as described above. CT abdomen and pelvis: 1. No acute traumatic injury of the abdomen or pelvis. 2. Large right renal staghorn calculus with some mild urothelial thickening is overall similar to comparison study which appears at most only partially obstructive given lack of calyceal dilatation. Could correlate with features of urinalysis to exclude superimposed urinary tract infection. 3. Geographic region of hyperenhancement in the spleen with some increasingly cystic appearance centrally, measuring up to 2.4 cm in size, previously 1.8 cm on comparison contrast enhanced CT from 2018. Findings are favored to represent a benign process such as a hemangioma or lymphangioma though could be further evaluated  with 6 month follow-up MRI given some slow interval change in imaging characteristics this recommendation follows ACR consensus guidelines: White Paper of the ACR Incidental Findings Committee II on Splenic and Nodal Findings. J Am Coll Radiol [phone]. 4. Nonspecific lobular low-attenuation collection in the inferior aspect of the right pericolic gutter measuring up to 2.7 cm in size, essentially unchanged from comparison in comparison in 2018. Appearance and stability favors a benign etiology such as a seroma or lymphocele. 5. 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 No resulting mechanical obstruction or evidence of vascular compromise of the bowel loop. Some mild stranding in the herniated fat contents could reflect residual fat strangulation though is decreased from comparison. 6. Colonic diverticulosis without evidence of diverticulitis. 7. Mild body wall edema. 8. Aortic Atherosclerosis (ICD10-I70.0). Electronically Signed   By: Lovena Le M.D.   On: 07/22/2020 20:01   CT ABDOMEN PELVIS W CONTRAST  Result Date: 07/22/2020 CLINICAL DATA:  Fall with positive head  strike on concrete, diffuse pain, on anticoagulation EXAM: CT HEAD WITHOUT CONTRAST CT CERVICAL SPINE WITHOUT CONTRAST CT ABDOMEN AND PELVIS WITH CONTRAST TECHNIQUE: Contiguous axial images were obtained from the base of the skull through the vertex without intravenous contrast. Multidetector CT imaging of the cervical spine was performed without intravenous contrast. Multiplanar CT image reconstructions were also generated. Multidetector CT imaging of the abdomen and pelvis was performed using the standard protocol following bolus administration of intravenous contrast. CONTRAST:  134mL OMNIPAQUE IOHEXOL 300 MG/ML  SOLN COMPARISON:  MR brain 01/31/2018, CT abdomen pelvis 09/01/2018, cervical spine radiograph 05/09/2013 FINDINGS: CT HEAD FINDINGS Brain: No evidence of acute infarction, hemorrhage, hydrocephalus, extra-axial collection, visible mass lesion or mass effect. Symmetric prominence of the ventricles, cisterns and sulci compatible with parenchymal volume loss. Patchy areas of white matter hypoattenuation are most compatible with chronic microvascular angiopathy. Vascular: Atherosclerotic calcification of the carotid siphons and intradural vertebral arteries. No hyperdense vessel. Skull: No significant scalp swelling. No calvarial fracture or acute osseous injury. Sinuses/Orbits: Minimal thickening in the ethmoids. Paranasal sinuses and mastoid air cells are otherwise predominantly clear. Orbits are unremarkable. Other: None. CT CERVICAL SPINE FINDINGS Alignment: Cervical stabilization collar absent the time of examination. Mild focal reversal the normal cervical lordosis centered at C5-6 as well as some mild anterolisthesis C3 on 4 of approximately 3 mm,, favored to be on a degenerative basis with cervical spondylitic changes at this level. No evidence of traumatic listhesis. No abnormally widened, perched or jumped facets. Normal alignment of the craniocervical and atlantoaxial articulations accounting for  rightward cranial rotation. Skull base and vertebrae: No acute skull base fracture. No vertebral body fracture or height loss. The osseous structures appear diffusely demineralized which may limit detection of small or nondisplaced fractures. Photon starvation artifact seen below C5 may further limit detection of subtle anomaly. Atlantodental arthrosis with calcific pannus formation, nonspecific though can be seen with CPPD or rheumatoid arthropathy. Multilevel spondylitic changes, as detailed below. Soft tissues and spinal canal: No pre or paravertebral fluid or swelling. No visible canal hematoma. Disc levels: Multilevel intervertebral disc height loss with spondylitic endplate changes. Larger disc osteophyte complexes present C4-5, C5-6 with some ligamentum flavum infolding result in at most mild canal stenosis best seen on the orthogonal reconstructions. Additional uncinate spurring and facet hypertrophic changes present throughout the cervical spine resulting in mild-to-moderate neural foraminal narrowing diffusely more moderate to severe C4-C6. Upper chest: No acute abnormality in the upper  chest or imaged lung apices. Other: Cervical carotid atherosclerosis. No worrisome thyroid nodules. CT ABDOMEN AND PELVIS FINDINGS Lower chest: Atelectatic changes present in the lung bases. Some additional scarring and/or subsegmental atelectasis seen in the right middle lobe and lingula. 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 Cardiac size is top normal. Three-vessel coronary artery atherosclerosis. No pericardial effusion. Hepatobiliary: No direct hepatic injury or perihepatic hematoma. No worrisome focal liver lesions. Smooth liver surface contour. Normal hepatic attenuation. Patient is post cholecystectomy. Prominence of the extrahepatic biliary tree with the common bile duct measuring up to 16 mm in diameter which is unchanged  from prior and likely related to a combination of senescent change and post cholecystectomy reservoir effect. Pancreas: No pancreatic contusive change or ductal disruption. Mild pancreatic atrophy. No pancreatic ductal dilatation or surrounding inflammatory changes. Spleen: No acute splenic injury or perisplenic hemorrhage. Geographic region of hyperenhancement seen in the spleen with some increasingly cystic appearance centrally, measuring up to 2.4 cm, previously 1.8 cm on comparison contrast enhanced CT from 2018. No other focal splenic lesion. Normal splenic size. Adrenals/Urinary Tract: Nodular thickening of the adrenal glands is similar to comparisons. Bilateral renal cortical scarring is again noted. Kidneys enhance and excrete symmetrically. No direct renal injury or perinephric hemorrhage. No extravasation of contrast on excretory delayed phase imaging. Large right renal staghorn calculus with some mild urothelial thickening is overall similar to comparison study which appears at most only partially obstructive given lack of calyceal dilatation. No new urolithiasis or frank hydronephrosis is seen. Urinary bladder is unremarkable. Stomach/Bowel: Mild edematous thickening of the distal thoracic esophagus. Prominence of gastric rugal likely related to underdistention. Duodenum is unremarkable with normal sweep across the midline abdomen. No small bowel thickening or dilatation. Cecum displaced to the midline abdomen. 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 No resulting mechanical obstruction or evidence of vascular compromise of the bowel loop. Only minimal hazy stranding of the herniated fat contents, certainly decreased from comparison. No distal colonic thickening or dilatation. Scattered colonic diverticula without focal inflammation to suggest diverticulitis. Vascular/Lymphatic: Atherosclerotic  calcifications within the abdominal aorta and branch vessels. No aneurysm or ectasia. No enlarged abdominopelvic lymph nodes. Reproductive: Normal appearance of the uterus and adnexal structures. Other: Nonspecific lobular low-attenuation collection in the inferior aspect of the right pericolic gutter measuring up to 10 Hounsfield units (2/65 and measuring 2.2 x 2.7 x 2.8 cm in size (2/65, 6/41) is essentially unchanged from comparison in 2018. No abdominopelvic free air or fluid. Mild body wall edema. 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 Tiny fat containing umbilical hernia. No other bowel containing hernias. 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 No acute traumatic osseous abnormality. Bones of the pelvis are intact and congruent. Some remote posterolateral lower thoracic left rib deformities are unchanged from prior. IMPRESSION: CT head: 1. No acute intracranial abnormality. No significant scalp swelling or calvarial fracture. 2. Chronic microvascular ischemic changes, parenchymal volume loss and intracranial atherosclerosis. CT cervical spine: 1. No acute fracture or traumatic listhesis of the cervical spine. 2. Multilevel degenerative changes of the cervical spine as described above. CT abdomen and pelvis: 1. No acute traumatic injury of the abdomen or pelvis. 2. Large right  renal staghorn calculus with some mild urothelial thickening is overall similar to comparison study which appears at most only partially obstructive given lack of calyceal dilatation. Could correlate with features of urinalysis to exclude superimposed urinary tract infection. 3. Geographic region of hyperenhancement in the spleen with some increasingly cystic appearance centrally, measuring up to 2.4 cm in size, previously 1.8 cm on comparison  contrast enhanced CT from 2018. Findings are favored to represent a benign process such as a hemangioma or lymphangioma though could be further evaluated with 6 month follow-up MRI given some slow interval change in imaging characteristics this recommendation follows ACR consensus guidelines: White Paper of the ACR Incidental Findings Committee II on Splenic and Nodal Findings. J Am Coll Radiol [phone]. 4. Nonspecific lobular low-attenuation collection in the inferior aspect of the right pericolic gutter measuring up to 2.7 cm in size, essentially unchanged from comparison in comparison in 2018. Appearance and stability favors a benign etiology such as a seroma or lymphocele. 5. 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 No resulting mechanical obstruction or evidence of vascular compromise of the bowel loop. Some mild stranding in the herniated fat contents could reflect residual fat strangulation though is decreased from comparison. 6. Colonic diverticulosis without evidence of diverticulitis. 7. Mild body wall edema. 8. Aortic Atherosclerosis (ICD10-I70.0). Electronically Signed   By: Lovena Le M.D.   On: 07/22/2020 20:01   DG Knee Complete 4 Views Right  Result Date: 07/22/2020 CLINICAL DATA:  Pain status post fall EXAM: RIGHT KNEE - COMPLETE 4+ VIEW COMPARISON:  April 03, 2012 FINDINGS: There is an acute periprosthetic fracture involving the supracondylar distal right femur most notably on the lateral aspect of the femur. There is a large joint effusion. There is an apparent new osteophyte arising from the upper pole of the patella. There is a subtle lucency coursing through this osteophyte suggestive of a nondisplaced fracture. There is soft tissue swelling about the knee. Vascular calcifications are noted. IMPRESSION: 1. Acute periprosthetic fracture involving the supracondylar distal right femur. 2. Lucency coursing  through the osteophyte arising from the upper pole of the patella suggestive of a nondisplaced fracture. 3. Large joint effusion. 4. Soft tissue swelling about the knee. 5. Vascular calcifications. Electronically Signed   By: Constance Holster M.D.   On: 07/22/2020 18:42   ECHOCARDIOGRAM COMPLETE  Result Date: 07/24/2020    ECHOCARDIOGRAM REPORT   Patient Name:   PHIONA RAMNAUTH Date of Exam: 07/23/2020 Medical Rec #:  [id number]       Height:       62.0 in Accession #:    [id number]      Weight:       147.7 lb Date of Birth:  Aug 21, 1941      BSA:          1.681 m Patient Age:    79 years        BP:           149/62 mmHg Patient Gender: F               HR:           59 bpm. Exam Location:  [facility] Procedure: 2D Echo, Cardiac Doppler and Color Doppler Indications:     R01.1 Murmur  History:         Patient has no prior history of Echocardiogram examinations.  Risk Factors:Hypertension, Dyslipidemia and Prediabetes.                  Scoliosis.  Sonographer:     Wilford Sports Rodgers-Jones Referring Phys:  Nolberto Hanlon Diagnosing Phys: Bartholome Bill MD IMPRESSIONS  1. Left ventricular ejection fraction, by estimation, is 55 to 60%. Left ventricular ejection fraction by PLAX is 55 %. The left ventricle has normal function. The left ventricle has no regional wall motion abnormalities. Left ventricular diastolic parameters were normal.  2. Right ventricular systolic function is normal. The right ventricular size is normal.  3. Left atrial size was mildly dilated.  4. Right atrial size was mildly dilated.  5. The mitral valve is grossly normal. Mild mitral valve regurgitation.  6. The aortic valve is grossly normal. Aortic valve regurgitation is trivial. FINDINGS  Left Ventricle: Left ventricular ejection fraction, by estimation, is 55 to 60%. Left ventricular ejection fraction by PLAX is 55 %. The left ventricle has normal function. The left ventricle has no regional wall motion abnormalities. The left  ventricular internal cavity size was normal in size. There is no left ventricular hypertrophy. Left ventricular diastolic parameters were normal. Right Ventricle: The right ventricular size is normal. No increase in right ventricular wall thickness. Right ventricular systolic function is normal. Left Atrium: Left atrial size was mildly dilated. Right Atrium: Right atrial size was mildly dilated. Pericardium: There is no evidence of pericardial effusion. Mitral Valve: The mitral valve is grossly normal. Mild mitral valve regurgitation. Tricuspid Valve: The tricuspid valve is grossly normal. Tricuspid valve regurgitation is mild. Aortic Valve: The aortic valve is grossly normal. Aortic valve regurgitation is trivial. Aortic valve mean gradient measures 9.3 mmHg. Aortic valve peak gradient measures 18.2 mmHg. Aortic valve area, by VTI measures 2.07 cm. Pulmonic Valve: The pulmonic valve was not well visualized. Pulmonic valve regurgitation is trivial. Aorta: The aortic root is normal in size and structure. IAS/Shunts: The interatrial septum was not assessed.  LEFT VENTRICLE PLAX 2D LV EF:         Left            Diastology                ventricular     LV e' medial:    6.74 cm/s                ejection        LV E/e' medial:  14.8                fraction by     LV e' lateral:   10.40 cm/s                PLAX is 55      LV E/e' lateral: 9.6                %. LVIDd:         4.54 cm LVIDs:         3.25 cm LV PW:         1.02 cm LV IVS:        1.02 cm LVOT diam:     2.10 cm LV SV:         85 LV SV Index:   50 LVOT Area:     3.46 cm  RIGHT VENTRICLE RV Basal diam:  4.34 cm RV S prime:     16.80 cm/s TAPSE (M-mode): 2.3 cm LEFT ATRIUM  Index       RIGHT ATRIUM           Index LA diam:        5.50 cm 3.27 cm/m  RA Area:     12.20 cm LA Vol (A2C):   74.6 ml 44.39 ml/m RA Volume:   25.80 ml  15.35 ml/m LA Vol (A4C):   48.4 ml 28.80 ml/m LA Biplane Vol: 60.3 ml 35.88 ml/m  AORTIC VALVE AV Area (Vmax):    2.31  cm AV Area (Vmean):   2.21 cm AV Area (VTI):     2.07 cm AV Vmax:           213.33 cm/s AV Vmean:          140.667 cm/s AV VTI:            0.410 m AV Peak Grad:      18.2 mmHg AV Mean Grad:      9.3 mmHg LVOT Vmax:         142.00 cm/s LVOT Vmean:        89.800 cm/s LVOT VTI:          0.245 m LVOT/AV VTI ratio: 0.60  AORTA Ao Root diam: 3.20 cm MITRAL VALVE               TRICUSPID VALVE MV Area (PHT): 3.17 cm    TR Peak grad:   42.5 mmHg MV Decel Time: 239 msec    TR Vmax:        326.00 cm/s MV E velocity: 99.80 cm/s MV A velocity: 83.90 cm/s  SHUNTS MV E/A ratio:  1.19        Systemic VTI:  0.24 m                            Systemic Diam: 2.10 cm Bartholome Bill MD Electronically signed by Bartholome Bill MD Signature Date/Time: 07/24/2020/7:39:11 AM    Final    US Abdomen Limited RUQ (LIVER/GB)  Result Date: 07/24/2020 CLINICAL DATA:  Elevated LFTs EXAM: ULTRASOUND ABDOMEN LIMITED RIGHT UPPER QUADRANT COMPARISON:  CT AP 07/22/2020 FINDINGS: Gallbladder: Status post cholecystectomy. Common bile duct: Diameter: 15.1 mm Liver: No focal lesion identified. Within normal limits in parenchymal echogenicity. Portal vein is patent on color Doppler imaging with normal direction of blood flow towards the liver. Other: None. IMPRESSION: 1. No acute abnormality noted. 2. Increase caliber of the common bile duct status post cholecystectomy. In the absence of signs of biliary obstruction findings are favored to represent post cholecystectomy physiology. Electronically Signed   By: Kerby Moors M.D.   On: 07/24/2020 09:12      Subjective: No complaints  Discharge Exam: Vitals:   07/24/20 2328 07/25/20 0755  BP: (!) 161/71 (!) 177/69  Pulse: (!) 54 (!) 48  Resp: 18 16  Temp: 98.3 F (36.8 C) 98.3 F (36.8 C)  SpO2: 98% 96%   Vitals:   07/24/20 0751 07/24/20 1618 07/24/20 2328 07/25/20 0755  BP: (!) 167/75 130/60 (!) 161/71 (!) 177/69  Pulse: (!) 53 (!) 54 (!) 54 (!) 48  Resp: 17 18 18 16   Temp: 98.4 F  (36.9 C) 98.3 F (36.8 C) 98.3 F (36.8 C) 98.3 F (36.8 C)  TempSrc: Oral  Oral Oral  SpO2: 93% 97% 98% 96%  Weight:      Height:        General: Pt is alert, awake, not in acute distress  Cardiovascular: RRR, S1/S2 +, no rubs, no gallops Respiratory: CTA bilaterally, no wheezing, no rhonchi Abdominal: Soft, NT, ND, bowel sounds + Extremities: no edema, RL in brace    The results of significant diagnostics from this hospitalization (including imaging, microbiology, ancillary and laboratory) are listed below for reference.     Microbiology: Recent Results (from the past 240 hour(s))  Respiratory Panel by RT PCR (Flu A&B, Covid) - Nasopharyngeal Swab     Status: None   Collection Time: 07/22/20  9:05 PM   Specimen: Nasopharyngeal Swab  Result Value Ref Range Status   SARS Coronavirus 2 by RT PCR NEGATIVE NEGATIVE Final    Comment: (NOTE) SARS-CoV-2 target nucleic acids are NOT DETECTED.  The SARS-CoV-2 RNA is generally detectable in upper respiratoy specimens during the acute phase of infection. The lowest concentration of SARS-CoV-2 viral copies this assay can detect is 131 copies/mL. A negative result does not preclude SARS-Cov-2 infection and should not be used as the sole basis for treatment or other patient management decisions. A negative result may occur with  improper specimen collection/handling, submission of specimen other than nasopharyngeal swab, presence of viral mutation(s) within the areas targeted by this assay, and inadequate number of viral copies (<131 copies/mL). A negative result must be combined with clinical observations, patient history, and epidemiological information. The expected result is Negative.  Fact Sheet for Patients:  PinkCheek.be  Fact Sheet for Healthcare Providers:  GravelBags.it  This test is no t yet approved or cleared by the [place] FDA and  has been authorized  for detection and/or diagnosis of SARS-CoV-2 by FDA under an Emergency Use Authorization (EUA). This EUA will remain  in effect (meaning this test can be used) for the duration of the COVID-19 declaration under Section 564(b)(1) of the Act, 21 U.S.C. section 360bbb-3(b)(1), unless the authorization is terminated or revoked sooner.     Influenza A by PCR NEGATIVE NEGATIVE Final   Influenza B by PCR NEGATIVE NEGATIVE Final    Comment: (NOTE) The Xpert Xpress SARS-CoV-2/FLU/RSV assay is intended as an aid in  the diagnosis of influenza from Nasopharyngeal swab specimens and  should not be used as a sole basis for treatment. Nasal washings and  aspirates are unacceptable for Xpert Xpress SARS-CoV-2/FLU/RSV  testing.  Fact Sheet for Patients: PinkCheek.be  Fact Sheet for Healthcare Providers: GravelBags.it  This test is not yet approved or cleared by the [place] FDA and  has been authorized for detection and/or diagnosis of SARS-CoV-2 by  FDA under an Emergency Use Authorization (EUA). This EUA will remain  in effect (meaning this test can be used) for the duration of the  Covid-19 declaration under Section 564(b)(1) of the Act, 21  U.S.C. section 360bbb-3(b)(1), unless the authorization is  terminated or revoked. Performed at [facility], [address] [place], [place] [address]   Respiratory Panel by RT PCR (Flu A&B, Covid) - Nasopharyngeal Swab     Status: None   Collection Time: 07/25/20 10:16 AM   Specimen: Nasopharyngeal Swab  Result Value Ref Range Status   SARS Coronavirus 2 by RT PCR NEGATIVE NEGATIVE Final    Comment: (NOTE) SARS-CoV-2 target nucleic acids are NOT DETECTED.  The SARS-CoV-2 RNA is generally detectable in upper respiratoy specimens during the acute phase of infection. The lowest concentration of SARS-CoV-2 viral copies this assay can detect is 131 copies/mL. A negative result  does not preclude SARS-Cov-2 infection and should not be used as the sole basis for treatment  or other patient management decisions. A negative result may occur with  improper specimen collection/handling, submission of specimen other than nasopharyngeal swab, presence of viral mutation(s) within the areas targeted by this assay, and inadequate number of viral copies (<131 copies/mL). A negative result must be combined with clinical observations, patient history, and epidemiological information. The expected result is Negative.  Fact Sheet for Patients:  PinkCheek.be  Fact Sheet for Healthcare Providers:  GravelBags.it  This test is no t yet approved or cleared by the [place] FDA and  has been authorized for detection and/or diagnosis of SARS-CoV-2 by FDA under an Emergency Use Authorization (EUA). This EUA will remain  in effect (meaning this test can be used) for the duration of the COVID-19 declaration under Section 564(b)(1) of the Act, 21 U.S.C. section 360bbb-3(b)(1), unless the authorization is terminated or revoked sooner.     Influenza A by PCR NEGATIVE NEGATIVE Final   Influenza B by PCR NEGATIVE NEGATIVE Final    Comment: (NOTE) The Xpert Xpress SARS-CoV-2/FLU/RSV assay is intended as an aid in  the diagnosis of influenza from Nasopharyngeal swab specimens and  should not be used as a sole basis for treatment. Nasal washings and  aspirates are unacceptable for Xpert Xpress SARS-CoV-2/FLU/RSV  testing.  Fact Sheet for Patients: PinkCheek.be  Fact Sheet for Healthcare Providers: GravelBags.it  This test is not yet approved or cleared by the [place] FDA and  has been authorized for detection and/or diagnosis of SARS-CoV-2 by  FDA under an Emergency Use Authorization (EUA). This EUA will remain  in effect (meaning this test can be used) for  the duration of the  Covid-19 declaration under Section 564(b)(1) of the Act, 21  U.S.C. section 360bbb-3(b)(1), unless the authorization is  terminated or revoked. Performed at [facility], [address] [place],  [address]      Labs: BNP (last 3 results) No results for input(s): BNP in the last 8760 hours. Basic Metabolic Panel: Recent Labs  Lab 07/22/20 1825 07/23/20 0326 07/24/20 0432  NA 140 140 137  K 4.2 3.0* 4.1  CL 102 102 102  CO2 29 29 27   GLUCOSE 121* 119* 102*  BUN 29* 25* 19  CREATININE 0.99 0.72 0.66  CALCIUM 9.2 8.6* 8.5*  MG  --   --  1.8   Liver Function Tests: Recent Labs  Lab 07/22/20 1825 07/23/20 0326 07/24/20 0432 07/25/20 0823  AST 20 140* 228* 62*  ALT 14 53* 275* 152*  ALKPHOS 62 68 97 89  BILITOT 0.6 1.0 0.9 0.8  PROT 7.3 6.3* 5.9* 6.0*  ALBUMIN 4.2 3.7 3.3* 3.4*   No results for input(s): LIPASE, AMYLASE in the last 168 hours. No results for input(s): AMMONIA in the last 168 hours. CBC: Recent Labs  Lab 07/22/20 1825 07/23/20 0326  WBC 9.8 4.4  NEUTROABS 8.5*  --   HGB 12.1 11.0*  HCT 37.4 33.1*  MCV 89.9 89.9  PLT 207 176   Cardiac Enzymes: No results for input(s): CKTOTAL, CKMB, CKMBINDEX, TROPONINI in the last 168 hours. BNP: Invalid input(s): POCBNP CBG: No results for input(s): GLUCAP in the last 168 hours. D-Dimer No results for input(s): DDIMER in the last 72 hours. Hgb A1c No results for input(s): HGBA1C in the last 72 hours. Lipid Profile No results for input(s): CHOL, HDL, LDLCALC, TRIG, CHOLHDL, LDLDIRECT in the last 72 hours. Thyroid function studies No results for input(s): TSH, T4TOTAL, T3FREE, THYROIDAB in the last 72 hours.  Invalid  input(s): FREET3 Anemia work up No results for input(s): VITAMINB12, FOLATE, FERRITIN, TIBC, IRON, RETICCTPCT in the last 72 hours. Urinalysis    Component Value Date/Time   COLORURINE YELLOW (A) 07/25/2020 0207   APPEARANCEUR HAZY (A) 07/25/2020  0207   APPEARANCEUR Cloudy (A) 08/16/2018 1435   LABSPEC 1.009 07/25/2020 0207   LABSPEC 1.011 03/21/2012 1300   PHURINE 8.0 07/25/2020 0207   GLUCOSEU NEGATIVE 07/25/2020 0207   GLUCOSEU Negative 03/21/2012 1300   HGBUR SMALL (A) 07/25/2020 0207   BILIRUBINUR NEGATIVE 07/25/2020 0207   BILIRUBINUR Negative 08/16/2018 1435   BILIRUBINUR Negative 03/21/2012 1300   KETONESUR NEGATIVE 07/25/2020 0207   PROTEINUR NEGATIVE 07/25/2020 0207   NITRITE NEGATIVE 07/25/2020 0207   LEUKOCYTESUR LARGE (A) 07/25/2020 0207   LEUKOCYTESUR 3+ 03/21/2012 1300   Sepsis Labs Invalid input(s): PROCALCITONIN,  WBC,  LACTICIDVEN Microbiology Recent Results (from the past 240 hour(s))  Respiratory Panel by RT PCR (Flu A&B, Covid) - Nasopharyngeal Swab     Status: None   Collection Time: 07/22/20  9:05 PM   Specimen: Nasopharyngeal Swab  Result Value Ref Range Status   SARS Coronavirus 2 by RT PCR NEGATIVE NEGATIVE Final    Comment: (NOTE) SARS-CoV-2 target nucleic acids are NOT DETECTED.  The SARS-CoV-2 RNA is generally detectable in upper respiratoy specimens during the acute phase of infection. The lowest concentration of SARS-CoV-2 viral copies this assay can detect is 131 copies/mL. A negative result does not preclude SARS-Cov-2 infection and should not be used as the sole basis for treatment or other patient management decisions. A negative result may occur with  improper specimen collection/handling, submission of specimen other than nasopharyngeal swab, presence of viral mutation(s) within the areas targeted by this assay, and inadequate number of viral copies (<131 copies/mL). A negative result must be combined with clinical observations, patient history, and epidemiological information. The expected result is Negative.  Fact Sheet for Patients:  PinkCheek.be  Fact Sheet for Healthcare Providers:  GravelBags.it  This test is no  t yet approved or cleared by the [place] FDA and  has been authorized for detection and/or diagnosis of SARS-CoV-2 by FDA under an Emergency Use Authorization (EUA). This EUA will remain  in effect (meaning this test can be used) for the duration of the COVID-19 declaration under Section 564(b)(1) of the Act, 21 U.S.C. section 360bbb-3(b)(1), unless the authorization is terminated or revoked sooner.     Influenza A by PCR NEGATIVE NEGATIVE Final   Influenza B by PCR NEGATIVE NEGATIVE Final    Comment: (NOTE) The Xpert Xpress SARS-CoV-2/FLU/RSV assay is intended as an aid in  the diagnosis of influenza from Nasopharyngeal swab specimens and  should not be used as a sole basis for treatment. Nasal washings and  aspirates are unacceptable for Xpert Xpress SARS-CoV-2/FLU/RSV  testing.  Fact Sheet for Patients: PinkCheek.be  Fact Sheet for Healthcare Providers: GravelBags.it  This test is not yet approved or cleared by the [place] FDA and  has been authorized for detection and/or diagnosis of SARS-CoV-2 by  FDA under an Emergency Use Authorization (EUA). This EUA will remain  in effect (meaning this test can be used) for the duration of the  Covid-19 declaration under Section 564(b)(1) of the Act, 21  U.S.C. section 360bbb-3(b)(1), unless the authorization is  terminated or revoked. Performed at [facility], [address] [place], [place] [address]   Respiratory Panel by RT PCR (Flu A&B, Covid) - Nasopharyngeal Swab     Status: None  Collection Time: 07/25/20 10:16 AM   Specimen: Nasopharyngeal Swab  Result Value Ref Range Status   SARS Coronavirus 2 by RT PCR NEGATIVE NEGATIVE Final    Comment: (NOTE) SARS-CoV-2 target nucleic acids are NOT DETECTED.  The SARS-CoV-2 RNA is generally detectable in upper respiratoy specimens during the acute phase of infection. The lowest concentration of  SARS-CoV-2 viral copies this assay can detect is 131 copies/mL. A negative result does not preclude SARS-Cov-2 infection and should not be used as the sole basis for treatment or other patient management decisions. A negative result may occur with  improper specimen collection/handling, submission of specimen other than nasopharyngeal swab, presence of viral mutation(s) within the areas targeted by this assay, and inadequate number of viral copies (<131 copies/mL). A negative result must be combined with clinical observations, patient history, and epidemiological information. The expected result is Negative.  Fact Sheet for Patients:  PinkCheek.be  Fact Sheet for Healthcare Providers:  GravelBags.it  This test is no t yet approved or cleared by the [place] FDA and  has been authorized for detection and/or diagnosis of SARS-CoV-2 by FDA under an Emergency Use Authorization (EUA). This EUA will remain  in effect (meaning this test can be used) for the duration of the COVID-19 declaration under Section 564(b)(1) of the Act, 21 U.S.C. section 360bbb-3(b)(1), unless the authorization is terminated or revoked sooner.     Influenza A by PCR NEGATIVE NEGATIVE Final   Influenza B by PCR NEGATIVE NEGATIVE Final    Comment: (NOTE) The Xpert Xpress SARS-CoV-2/FLU/RSV assay is intended as an aid in  the diagnosis of influenza from Nasopharyngeal swab specimens and  should not be used as a sole basis for treatment. Nasal washings and  aspirates are unacceptable for Xpert Xpress SARS-CoV-2/FLU/RSV  testing.  Fact Sheet for Patients: PinkCheek.be  Fact Sheet for Healthcare Providers: GravelBags.it  This test is not yet approved or cleared by the [place] FDA and  has been authorized for detection and/or diagnosis of SARS-CoV-2 by  FDA under an Emergency Use  Authorization (EUA). This EUA will remain  in effect (meaning this test can be used) for the duration of the  Covid-19 declaration under Section 564(b)(1) of the Act, 21  U.S.C. section 360bbb-3(b)(1), unless the authorization is  terminated or revoked. Performed at [facility], [address] [place], [place] [address]      Time coordinating discharge: Over 30 minutes  SIGNED:   Nolberto Hanlon, MD  [facility] 07/25/2020, 1:40 PM Pager   If 7PM-7AM, please contact night-coverage [email or web address] Password [email or web address]

## 2020-07-25 NOTE — Progress Notes (Signed)
Physical Therapy Treatment Patient Details Name: Regina Mathis MRN: [id number] DOB: 12-19-1940 Today's Date: 07/25/2020    History of Present Illness 79 y.o. female with medical history significant of osteoporosis, osteoarthritis status post total knee replacement, hyperlipidemia, atrial fibrillation on chronic anticoagulation, hypertension, GERD, scoliosis, and valvular heart disease who sustained a fall at home.  Patient has had recurrent falls lately, this episode she fell and twisted her right knee.  She was brought in unable to walk and in severe pain.  Patient was found to have periprosthetic fracture of the right knee. Orthopedic recommends non-surgical conservative treatment.      PT Comments    Pt was pleasant and motivated to participate during the session.Pt was able to perform bed level therex with mild effort. Pt required PT assist for R L E movement. Pt was able to perform bed mobility with increased ease and only required Min A for R LE management and to guard trunk while pushing up to sitting. Pt able to sit on EOB for 2 minutes with good balance. Pt verbalized NWB precautions and performed STS with Min A. Pt required heavy cueing and PT assist to keep weight off R LE. Pt required Min/ModA to keep balance on one leg. Pt self iniated sitting after 2 minutes. Pt's knee immobilizer was readjusted as it had slipped when standing. Pt performed STS and with Min A and still required Min/ Mod A to remain standing. Pt attempted to side shuffle on L LE but was unable to move from the initial position. Pt performed sit to supine with Min A for R LE management and the knee immobilizer was adjusted again. Pt will benefit from PT services in a SNF setting upon discharge to safely address deficits listed in patient problem list for decreased caregiver assistance and eventual return to PLOF.    Follow Up Recommendations  SNF;Supervision/Assistance - 24 hour     Equipment Recommendations  Other  (comment) (TBD)    Recommendations for Other Services       Precautions / Restrictions Precautions Precautions: Fall Precaution Comments: no knee flexion Knee Immobilizer - Right: On at all times Restrictions RLE Weight Bearing: Non weight bearing    Mobility  Bed Mobility Overal bed mobility: Needs Assistance Bed Mobility: Supine to Sit;Sit to Supine     Supine to sit: Min assist Sit to supine: Min assist   General bed mobility comments: Pt requires Min A for R LE management and uses elevated HOB and rails to come up to sitting. Mod VCs provided for sequencing  Transfers Overall transfer level: Needs assistance Equipment used: Rolling walker (2 wheeled) Transfers: Sit to/from Stand Sit to Stand: Mod assist;From elevated surface         General transfer comment: Heavy cueing used for NWB precautions. Pt struggles to keep weight off of R LE and requires PT to prevent putting weight through foot. Pt able to to put weight through arms and is actively trying to keep precautions  Ambulation/Gait             General Gait Details: Pt unable to perform. Pt can shuffle L foot but is unable to move from standing spot   Stairs             Wheelchair Mobility    Modified Rankin (Stroke Patients Only)       Balance Overall balance assessment: Needs assistance Sitting-balance support: Single extremity supported;Feet unsupported Sitting balance-Leahy Scale: Good Sitting balance - Comments: pt able to maintain  static sitting Balance EOB with no LOB   Standing balance support: Bilateral upper extremity supported Standing balance-Leahy Scale: Poor Standing balance comment: pt able to maintain standing with MIN/MOD A and bilateral UE support. Pt attempts to place weight through NWB and needs PT support to remain standing while on one leg                            Cognition Arousal/Alertness: Awake/alert Behavior During Therapy: WFL for tasks  assessed/performed Overall Cognitive Status: Within Functional Limits for tasks assessed                                 General Comments: pt is impulsive but appropriate for commands/ cues      Exercises Total Joint Exercises Ankle Circles/Pumps: AROM;Both;10 reps Short Arc Quad: AROM;Left;10 reps Hip ABduction/ADduction: AROM;10 reps;Left (AAROM R LE 10) Straight Leg Raises: AROM;Left;10 reps (AAROM R LE) Long Arc Quad: AROM;Left;10 reps Other Exercises Other Exercises: pt reducated on NWB precautions    General Comments General comments (skin integrity, edema, etc.): Pt's knee immobilizer needed readjusting multiple times throughout session      Pertinent Vitals/Pain Faces Pain Scale: Hurts little more Pain Location: pt reports leg hurts witj movement Pain Descriptors / Indicators: Grimacing;Guarding Pain Intervention(s): Limited activity within patient's tolerance;Monitored during session;Repositioned    Home Living                      Prior Function            PT Goals (current goals can now be found in the care plan section) Acute Rehab PT Goals Patient Stated Goal: get back home with sister PT Goal Formulation: With patient Time For Goal Achievement: 08/06/20 Potential to Achieve Goals: Good Progress towards PT goals: Progressing toward goals    Frequency    Min 2X/week      PT Plan Current plan remains appropriate    Co-evaluation              AM-PAC PT "6 Clicks" Mobility   Outcome Measure  Help needed turning from your back to your side while in a flat bed without using bedrails?: A Little Help needed moving from lying on your back to sitting on the side of a flat bed without using bedrails?: A Little Help needed moving to and from a bed to a chair (including a wheelchair)?: A Lot Help needed standing up from a chair using your arms (e.g., wheelchair or bedside chair)?: A Lot Help needed to walk in hospital room?: A  Lot Help needed climbing 3-5 steps with a railing? : Total 6 Click Score: 13    End of Session Equipment Utilized During Treatment: Right knee immobilizer;Gait belt Activity Tolerance: Patient tolerated treatment well Patient left: in bed;with call bell/phone within reach;with nursing/sitter in room Nurse Communication: Mobility status PT Visit Diagnosis: Muscle weakness (generalized) (M62.81);Difficulty in walking, not elsewhere classified (R26.2);Pain Pain - Right/Left: Right Pain - part of body: Knee     Time: 0922-1001 PT Time Calculation (min) (ACUTE ONLY): 39 min  Charges:                        Hervey Ard, SPT 07/25/20. 10:50 AM

## 2020-07-25 NOTE — Plan of Care (Signed)
  Problem: Education: Goal: Knowledge of General Education information will improve Description: Including pain rating scale, medication(s)/side effects and non-pharmacologic comfort measures 07/25/2020 1409 by Trula Slade, RN Outcome: Progressing 07/25/2020 0922 by Trula Slade, RN Outcome: Progressing   Problem: Health Behavior/Discharge Planning: Goal: Ability to manage health-related needs will improve 07/25/2020 1409 by Trula Slade, RN Outcome: Progressing 07/25/2020 0922 by Trula Slade, RN Outcome: Progressing   Problem: Clinical Measurements: Goal: Ability to maintain clinical measurements within normal limits will improve 07/25/2020 1409 by Trula Slade, RN Outcome: Progressing 07/25/2020 0922 by Trula Slade, RN Outcome: Progressing Goal: Will remain free from infection 07/25/2020 1409 by Trula Slade, RN Outcome: Progressing 07/25/2020 0922 by Trula Slade, RN Outcome: Progressing Goal: Diagnostic test results will improve 07/25/2020 1409 by Trula Slade, RN Outcome: Progressing 07/25/2020 0922 by Trula Slade, RN Outcome: Progressing Goal: Respiratory complications will improve 07/25/2020 1409 by Trula Slade, RN Outcome: Progressing 07/25/2020 0922 by Trula Slade, RN Outcome: Progressing Goal: Cardiovascular complication will be avoided 07/25/2020 1409 by Trula Slade, RN Outcome: Progressing 07/25/2020 0922 by Trula Slade, RN Outcome: Progressing   Problem: Activity: Goal: Risk for activity intolerance will decrease 07/25/2020 1409 by Trula Slade, RN Outcome: Progressing 07/25/2020 0922 by Trula Slade, RN Outcome: Progressing   Problem: Nutrition: Goal: Adequate nutrition will be maintained 07/25/2020 1409 by Trula Slade, RN Outcome: Progressing 07/25/2020 0922 by Trula Slade, RN Outcome: Progressing   Problem: Elimination: Goal: Will not experience  complications related to bowel motility 07/25/2020 1409 by Trula Slade, RN Outcome: Progressing 07/25/2020 0922 by Trula Slade, RN Outcome: Progressing Goal: Will not experience complications related to urinary retention 07/25/2020 1409 by Trula Slade, RN Outcome: Progressing 07/25/2020 0922 by Trula Slade, RN Outcome: Progressing   Problem: Pain Managment: Goal: General experience of comfort will improve 07/25/2020 1409 by Trula Slade, RN Outcome: Progressing 07/25/2020 0922 by Trula Slade, RN Outcome: Progressing   Problem: Safety: Goal: Ability to remain free from injury will improve 07/25/2020 1409 by Trula Slade, RN Outcome: Progressing 07/25/2020 0922 by Trula Slade, RN Outcome: Progressing

## 2020-07-25 NOTE — Progress Notes (Signed)
Subjective:  HD #3 for non-op treatment of right non-displaced distal periprosthestic femur and patella fractures.  Patient reports right knee pain as moderate with PT.  No pain at rest.  Objective:   VITALS:   Vitals:   07/24/20 0751 07/24/20 1618 07/24/20 2328 07/25/20 0755  BP: (!) 167/75 130/60 (!) 161/71 (!) 177/69  Pulse: (!) 53 (!) 54 (!) 54 (!) 48  Resp: 17 18 18 16   Temp: 98.4 F (36.9 C) 98.3 F (36.8 C) 98.3 F (36.8 C) 98.3 F (36.8 C)  TempSrc: Oral  Oral Oral  SpO2: 93% 97% 98% 96%  Weight:      Height:        PHYSICAL EXAM: Right lower extremity Neurovascular intact Sensation intact distally Intact pulses distally Dorsiflexion/Plantar flexion intact No cellulitis present Compartment soft  LABS  Results for orders placed or performed during the hospital encounter of 07/22/20 (from the past 24 hour(s))  Hepatitis panel, acute     Status: None   Collection Time: 07/24/20  3:59 PM  Result Value Ref Range   Hepatitis B Surface Ag NON REACTIVE NON REACTIVE   HCV Ab NON REACTIVE NON REACTIVE   Hep A IgM NON REACTIVE NON REACTIVE   Hep B C IgM NON REACTIVE NON REACTIVE  Urinalysis, Complete w Microscopic Urine, Clean Catch     Status: Abnormal   Collection Time: 07/25/20  2:07 AM  Result Value Ref Range   Color, Urine YELLOW (A) YELLOW   APPearance HAZY (A) CLEAR   Specific Gravity, Urine 1.009 1.005 - 1.030   pH 8.0 5.0 - 8.0   Glucose, UA NEGATIVE NEGATIVE mg/dL   Hgb urine dipstick SMALL (A) NEGATIVE   Bilirubin Urine NEGATIVE NEGATIVE   Ketones, ur NEGATIVE NEGATIVE mg/dL   Protein, ur NEGATIVE NEGATIVE mg/dL   Nitrite NEGATIVE NEGATIVE   Leukocytes,Ua LARGE (A) NEGATIVE   RBC / HPF 0-5 0 - 5 RBC/hpf   WBC, UA 21-50 0 - 5 WBC/hpf   Bacteria, UA RARE (A) NONE SEEN   Squamous Epithelial / LPF 0-5 0 - 5  Protime-INR     Status: None   Collection Time: 07/25/20  5:29 AM  Result Value Ref Range   Prothrombin Time 13.0 11.4 - 15.2 seconds   INR  1.0 0.8 - 1.2  Hepatic function panel     Status: Abnormal   Collection Time: 07/25/20  8:23 AM  Result Value Ref Range   Total Protein 6.0 (L) 6.5 - 8.1 g/dL   Albumin 3.4 (L) 3.5 - 5.0 g/dL   AST 62 (H) 15 - 41 U/L   ALT 152 (H) 0 - 44 U/L   Alkaline Phosphatase 89 38 - 126 U/L   Total Bilirubin 0.8 0.3 - 1.2 mg/dL   Bilirubin, Direct 0.1 0.0 - 0.2 mg/dL   Indirect Bilirubin 0.7 0.3 - 0.9 mg/dL    ECHOCARDIOGRAM COMPLETE  Result Date: 07/24/2020    ECHOCARDIOGRAM REPORT   Patient Name:   Regina Mathis Date of Exam: 07/23/2020 Medical Rec #:  [id number]       Height:       62.0 in Accession #:    [id number]      Weight:       147.7 lb Date of Birth:  [birth year]      BSA:          1.681 m Patient Age:    79 years        BP:  149/62 mmHg Patient Gender: F               HR:           59 bpm. Exam Location:  [facility] Procedure: 2D Echo, Cardiac Doppler and Color Doppler Indications:     R01.1 Murmur  History:         Patient has no prior history of Echocardiogram examinations.                  Risk Factors:Hypertension, Dyslipidemia and Prediabetes.                  Scoliosis.  Sonographer:     Wilford Sports Rodgers-Jones Referring Phys:  Nolberto Hanlon Diagnosing Phys: Bartholome Bill MD IMPRESSIONS  1. Left ventricular ejection fraction, by estimation, is 55 to 60%. Left ventricular ejection fraction by PLAX is 55 %. The left ventricle has normal function. The left ventricle has no regional wall motion abnormalities. Left ventricular diastolic parameters were normal.  2. Right ventricular systolic function is normal. The right ventricular size is normal.  3. Left atrial size was mildly dilated.  4. Right atrial size was mildly dilated.  5. The mitral valve is grossly normal. Mild mitral valve regurgitation.  6. The aortic valve is grossly normal. Aortic valve regurgitation is trivial. FINDINGS  Left Ventricle: Left ventricular ejection fraction, by estimation, is 55 to 60%. Left ventricular ejection  fraction by PLAX is 55 %. The left ventricle has normal function. The left ventricle has no regional wall motion abnormalities. The left ventricular internal cavity size was normal in size. There is no left ventricular hypertrophy. Left ventricular diastolic parameters were normal. Right Ventricle: The right ventricular size is normal. No increase in right ventricular wall thickness. Right ventricular systolic function is normal. Left Atrium: Left atrial size was mildly dilated. Right Atrium: Right atrial size was mildly dilated. Pericardium: There is no evidence of pericardial effusion. Mitral Valve: The mitral valve is grossly normal. Mild mitral valve regurgitation. Tricuspid Valve: The tricuspid valve is grossly normal. Tricuspid valve regurgitation is mild. Aortic Valve: The aortic valve is grossly normal. Aortic valve regurgitation is trivial. Aortic valve mean gradient measures 9.3 mmHg. Aortic valve peak gradient measures 18.2 mmHg. Aortic valve area, by VTI measures 2.07 cm. Pulmonic Valve: The pulmonic valve was not well visualized. Pulmonic valve regurgitation is trivial. Aorta: The aortic root is normal in size and structure. IAS/Shunts: The interatrial septum was not assessed.  LEFT VENTRICLE PLAX 2D LV EF:         Left            Diastology                ventricular     LV e' medial:    6.74 cm/s                ejection        LV E/e' medial:  14.8                fraction by     LV e' lateral:   10.40 cm/s                PLAX is 55      LV E/e' lateral: 9.6                %. LVIDd:         4.54 cm LVIDs:         3.25 cm LV  PW:         1.02 cm LV IVS:        1.02 cm LVOT diam:     2.10 cm LV SV:         85 LV SV Index:   50 LVOT Area:     3.46 cm  RIGHT VENTRICLE RV Basal diam:  4.34 cm RV S prime:     16.80 cm/s TAPSE (M-mode): 2.3 cm LEFT ATRIUM             Index       RIGHT ATRIUM           Index LA diam:        5.50 cm 3.27 cm/m  RA Area:     12.20 cm LA Vol (A2C):   74.6 ml 44.39 ml/m RA  Volume:   25.80 ml  15.35 ml/m LA Vol (A4C):   48.4 ml 28.80 ml/m LA Biplane Vol: 60.3 ml 35.88 ml/m  AORTIC VALVE AV Area (Vmax):    2.31 cm AV Area (Vmean):   2.21 cm AV Area (VTI):     2.07 cm AV Vmax:           213.33 cm/s AV Vmean:          140.667 cm/s AV VTI:            0.410 m AV Peak Grad:      18.2 mmHg AV Mean Grad:      9.3 mmHg LVOT Vmax:         142.00 cm/s LVOT Vmean:        89.800 cm/s LVOT VTI:          0.245 m LVOT/AV VTI ratio: 0.60  AORTA Ao Root diam: 3.20 cm MITRAL VALVE               TRICUSPID VALVE MV Area (PHT): 3.17 cm    TR Peak grad:   42.5 mmHg MV Decel Time: 239 msec    TR Vmax:        326.00 cm/s MV E velocity: 99.80 cm/s MV A velocity: 83.90 cm/s  SHUNTS MV E/A ratio:  1.19        Systemic VTI:  0.24 m                            Systemic Diam: 2.10 cm Bartholome Bill MD Electronically signed by Bartholome Bill MD Signature Date/Time: 07/24/2020/7:39:11 AM    Final    US Abdomen Limited RUQ (LIVER/GB)  Result Date: 07/24/2020 CLINICAL DATA:  Elevated LFTs EXAM: ULTRASOUND ABDOMEN LIMITED RIGHT UPPER QUADRANT COMPARISON:  CT AP 07/22/2020 FINDINGS: Gallbladder: Status post cholecystectomy. Common bile duct: Diameter: 15.1 mm Liver: No focal lesion identified. Within normal limits in parenchymal echogenicity. Portal vein is patent on color Doppler imaging with normal direction of blood flow towards the liver. Other: None. IMPRESSION: 1. No acute abnormality noted. 2. Increase caliber of the common bile duct status post cholecystectomy. In the absence of signs of biliary obstruction findings are favored to represent post cholecystectomy physiology. Electronically Signed   By: Kerby Moors M.D.   On: 07/24/2020 09:12    Assessment/Plan:     Principal Problem:   Fall Active Problems:   Atrial fibrillation ([place])   Essential hypertension   Dementia (HCC)   Hyperlipidemia   Primary osteoarthritis involving multiple joints   Displaced apophyseal fracture of right femur,  initial encounter for  closed fracture (HCC)  NWB on right leg.  No knee flexion.  Knee immobilizer on right knee at all times.  Follow up in 1-2 weeks after discharge.    [place] , MD 07/25/2020, 11:40 AM

## 2020-08-18 ENCOUNTER — Ambulatory Visit: Payer: Medicare HMO | Admitting: Podiatry

## 2024-04-03 ENCOUNTER — Other Ambulatory Visit: Payer: Self-pay | Admitting: Nurse Practitioner

## 2024-04-03 DIAGNOSIS — Z9189 Other specified personal risk factors, not elsewhere classified: Secondary | ICD-10-CM

## 2024-04-03 DIAGNOSIS — R600 Localized edema: Secondary | ICD-10-CM

## 2024-04-04 ENCOUNTER — Ambulatory Visit
Admission: RE | Admit: 2024-04-04 | Discharge: 2024-04-04 | Disposition: A | Discharge status: Home / Self Care | Source: Ambulatory Visit | Attending: Nurse Practitioner | Admitting: Nurse Practitioner

## 2024-04-04 DIAGNOSIS — R600 Localized edema: Secondary | ICD-10-CM | POA: Insufficient documentation

## 2024-04-04 DIAGNOSIS — Z9189 Other specified personal risk factors, not elsewhere classified: Secondary | ICD-10-CM | POA: Diagnosis present
